# Patient Record
Sex: Female | Born: 1960
Health system: Southern US, Community
[De-identification: ages and names within clinical notes are randomized; demographics above are authoritative.]

## PROBLEM LIST (undated history)

## (undated) DIAGNOSIS — I10 Essential (primary) hypertension: Secondary | ICD-10-CM

## (undated) DIAGNOSIS — F419 Anxiety disorder, unspecified: Secondary | ICD-10-CM

## (undated) DIAGNOSIS — I2699 Other pulmonary embolism without acute cor pulmonale: Secondary | ICD-10-CM

## (undated) DIAGNOSIS — I82412 Acute embolism and thrombosis of left femoral vein: Secondary | ICD-10-CM

## (undated) DIAGNOSIS — I871 Compression of vein: Secondary | ICD-10-CM

## (undated) DIAGNOSIS — M1990 Unspecified osteoarthritis, unspecified site: Secondary | ICD-10-CM

## (undated) DIAGNOSIS — M797 Fibromyalgia: Secondary | ICD-10-CM

## (undated) DIAGNOSIS — K219 Gastro-esophageal reflux disease without esophagitis: Secondary | ICD-10-CM

## (undated) DIAGNOSIS — J45909 Unspecified asthma, uncomplicated: Secondary | ICD-10-CM

## (undated) DIAGNOSIS — Z8669 Personal history of other diseases of the nervous system and sense organs: Secondary | ICD-10-CM

## (undated) HISTORY — DX: Anxiety disorder, unspecified: F41.9

## (undated) HISTORY — PX: IVC FILTER INSERTION: CATH118245

## (undated) HISTORY — DX: Compression of vein: I87.1

## (undated) HISTORY — PX: JOINT REPLACEMENT: SHX530

## (undated) HISTORY — PX: CHOLECYSTECTOMY: SHX55

## (undated) HISTORY — PX: ABDOMINAL HYSTERECTOMY: SHX81

---

## 1999-11-07 ENCOUNTER — Encounter: Payer: Self-pay | Admitting: Orthopedic Surgery

## 1999-11-07 ENCOUNTER — Encounter: Admission: RE | Admit: 1999-11-07 | Discharge: 1999-11-07 | Payer: Self-pay | Admitting: Orthopedic Surgery

## 2005-01-05 ENCOUNTER — Ambulatory Visit: Payer: Self-pay | Admitting: Cardiology

## 2007-11-13 ENCOUNTER — Inpatient Hospital Stay (HOSPITAL_COMMUNITY): Admission: RE | Admit: 2007-11-13 | Discharge: 2007-11-17 | Payer: Self-pay | Admitting: Orthopedic Surgery

## 2007-11-21 ENCOUNTER — Ambulatory Visit (HOSPITAL_COMMUNITY): Admission: RE | Admit: 2007-11-21 | Discharge: 2007-11-21 | Payer: Self-pay | Admitting: Orthopedic Surgery

## 2007-11-21 ENCOUNTER — Ambulatory Visit: Payer: Self-pay | Admitting: Surgery

## 2007-11-21 ENCOUNTER — Encounter (INDEPENDENT_AMBULATORY_CARE_PROVIDER_SITE_OTHER): Payer: Self-pay | Admitting: Orthopedic Surgery

## 2007-12-10 ENCOUNTER — Encounter: Admission: RE | Admit: 2007-12-10 | Discharge: 2008-03-09 | Payer: Self-pay | Admitting: Orthopedic Surgery

## 2010-05-16 ENCOUNTER — Other Ambulatory Visit: Payer: Self-pay | Admitting: Obstetrics and Gynecology

## 2010-05-16 DIAGNOSIS — Z1231 Encounter for screening mammogram for malignant neoplasm of breast: Secondary | ICD-10-CM

## 2010-05-25 ENCOUNTER — Ambulatory Visit: Payer: Self-pay

## 2010-07-26 NOTE — H&P (Signed)
Theresa Lucas, Theresa Lucas               ACCOUNT NO.:  [id number]   MEDICAL RECORD NO.:  [id number]          PATIENT TYPE:  INP   LOCATION:                               FACILITY[place]  [facility] [place]   PHYSICIAN:  Georges Lynch. Gioffre, M.D.DATE OF BIRTH:  December 12, 1960   DATE OF ADMISSION:  11/13/2007  DATE OF DISCHARGE:                              HISTORY & PHYSICAL   CHIEF COMPLAINT:  Painful range of motion bilateral knees.   HISTORY OF PRESENT ILLNESS:  Theresa Lucas is a 50 year old female who has  been evaluated by Dr. Darrelyn Hillock for bilateral knee pain.  X-rays reveal  that she has got near bone-on-bone medial compartment bilateral knees.  The patient has been having a very poor quality of life due to pain and  difficulty ambulating.  She is unable to walk around and participate  with activities with her daughter.  She has failed arthroscopies,  viscosupplements and cortisone injections.  She is on chronic pain  medicines and she would like to proceed with knee replacements.  Currently, the right knee is significantly worse than left knee.   PAST MEDICAL HISTORY:  1. Some anxiety and depression.  2. Peripheral edema.  3. Reflux disease.  4. Hemorrhoids.  5. History of gallstones with subsequent cholecystectomy.  6. History of kidney stones.   CURRENT MEDICATIONS:  1. Prozac 40 mg a day.  2. Lasix 20 mg b.i.d.  3. Darvocet 1 tablet every 4-6 hours.  4. __________20 mg once a day.  5. Trazodone 100 mg nightly.   ALLERGIES:  NO KNOWN DRUG ALLERGIES.   PRIMARY CARE PHYSICIAN:  Dr. Almond Lint up in [place].   REVIEW OF SYSTEMS:  NEUROLOGIC:  Negative for any neurologic other than  the anxiety and depression which is well-controlled and being managed by  Dr. Garner Nash.  PULMONARY:  She denies any pulmonary.  CARDIOVASCULAR:  Just has some peripheral edema for which she is on Lasix.  She had a  stress test 2 years previous which was within normal limits, was found  to be reflux.  GI:  She does have  reflux.  This is well controlled with  the medications.  She does have some hemorrhoids.  GU:  She does have a  history of kidney stones last 4 years previous.  No frequent UTIs or any  other problems.  ENDOCRINE:  Unremarkable other than gestational  diabetes with each of her children and these cleared postdelivery.  HEMATOLOGIC:  Unremarkable.   PAST SURGICAL HISTORY:  Knee arthroscopies and cholecystectomy without  any complications with anesthesia.   FAMILY MEDICAL HISTORY:  Father is deceased from a stroke at 82.  Mother  is deceased from colon cancer at the age of 20.  One sister with heart  disease at the age of 26.   SOCIAL HISTORY:  The patient is married.  She works for [employer].  She has never smoked.  No alcohol or drug problems.  She has  three grown children and lives with her family and will provide care  after her discharge.   PHYSICAL EXAMINATION:  VITAL SIGNS:  Height is 5 foot 4 inches, weight  is 240.  Blood pressure is 148/88, pulse of 70 and regular, respirations  are 12 and nonlabored.  The patient is afebrile.  GENERAL:  This is a heavy-set female, conscious, alert and appropriate,  appears to be a good historian.  Walks with a very difficult and awkward  gait due to difficulty with motion of her knees.  HEENT:  Head was normocephalic.  Pupils equal, round and reactive.  Gross hearing is intact.  NECK:  Supple.  No palpable lymphadenopathy.  CHEST:  Lung sounds were clear and equal bilaterally.  HEART:  Regular rate and rhythm.  No murmurs, rubs or gallops.  ABDOMEN:  Soft, nontender.  Bowel sounds present.  EXTREMITIES:  The patient had excellent range of motion of shoulders,  elbows and wrists.  Motor strength 5/5.  LOWER EXTREMITIES:  Both hips had full extension, flexion up to 120  degrees with 20 degrees internal-external rotation without any  difficulty.  Bilateral knees were boggy appearing.  No signs of erythema  or ecchymosis.  Right  knee; she lacked about 5 degrees of extension.  She was able to flex it back to 90 degrees.  Left knee; she was able to  fully extend it, but she could only go back to 90 degrees.  She had no  gross instability.  No effusions.  Both calves were soft and nontender.  Ankles were symmetric with good motion.  PERIPHERAL VASCULAR:  Carotid pulses were 2+, no bruits.  Radial pulses  were 2+.  Dorsalis pedis pulses were 2+.  She had a few scattered  varicosities, but she had no lower extremity edema or pigmentation  changes.  NEURO:  The patient was conscious, alert and appropriate, appeared to be  a good historian.  No gross neurologic deficits.  BREAST/RECTAL/GU:  Deferred at this time.   IMPRESSION:  1. End-stage osteoarthritis bilateral knees, right more symptomatic      than left.  2. Obesity.  3. Anxiety/depression.  4. Gastroesophageal reflux disease.  5. Hemorrhoids.  6. History of peripheral edema.   PLAN:  The patient will undergo all routine labs and tests prior to  having a right total knee arthroplasty by Dr. Darrelyn Hillock at [facility] on November 13, 2007.  The patient has been cleared by her  primary care physician, Dr. Almond Lint up in [place].      Jamelle Rushing, P.A.    ______________________________  Georges Lynch Darrelyn Hillock, M.D.    RWK/MEDQ  D:  11/05/2007  T:  11/05/2007  Job:  [id number]   cc:   Windy Fast A. Darrelyn Hillock, M.D.  Fax: [phone]

## 2010-07-26 NOTE — Discharge Summary (Signed)
Theresa Lucas, Theresa Lucas               ACCOUNT NO.:  [id number]   MEDICAL RECORD NO.:  [id number]          PATIENT TYPE:  INP   LOCATION:  1612                         FACILITY:  [facility]   PHYSICIAN:  Georges Lynch. Gioffre, M.D.DATE OF BIRTH:  1961/01/11   DATE OF ADMISSION:  11/13/2007  DATE OF DISCHARGE:  11/17/2007                               DISCHARGE SUMMARY   DISPOSITION:  To home.   ADMISSION DIAGNOSES:  1. End-stage osteoarthritis bilateral knees, right more symptomatic      left.  2. Obesity.  3. Anxiety/depression.  4. Gastroesophageal reflux disease.  5. Hemorrhoids.  6. History of peripheral edema.   DISCHARGE DIAGNOSES:  1. Status post right total knee arthroplasty.  2. Left knee end-stage osteoarthritis.  3. Obesity.  4. Anxiety/depression.  5. Gastroesophageal reflux disease.  6. History of hemorrhoids.  7. History of peripheral edema.   HISTORY OF PRESENT ILLNESS:  The patient is a 50 year old female patient  of Dr. Jeannetta Ellis who has been evaluated and treated for bilateral knee  pain.  X-rays reveal she was end-stage osteoarthritis, bone on bone,  medial compartment bilateral knees.  The patient states she is having a  poor quality of life due to the pain and difficulty with ambulations.  She has failed conservative treatment.  The patient has elected proceed  with a total knee arthroplasty on her right knee due to that is more  symptomatic than right.   ALLERGIES:  NO KNOWN DRUG ALLERGIES.   CURRENT MEDICATIONS:  On admission:  1. Prozac 40 mg a day.  2. Lasix 20 mg twice a day.  3. Darvocet 1 tablet every 4-6 hours.  4. Aciphex 20 mg a day.  5. Trazodone 100 mg at night.   SURGICAL PROCEDURES:  On November 13, 2007 the patient was taken to the  OR by Dr. Ranee Gosselin, assisted by Oneida Alar, PA-C. Under general  anesthesia the patient underwent a right total knee arthroplasty with a  DePuy rotating platform system.  The patient had no complications.  Minimal blood loss.  The patient tolerated the procedure well.  The  patient had the following components implanted:  A size 2.5 femoral  component, a size 3 keel tibial tray, a size 2.5 polyethylene bearing  12.5 mm thickness, a size 35 three peg patella.  All components were  implanted with polymethyl methacrylate and vancomycin.  The patient was  transferred to recovery room and then to the orthopedic floor in good  condition for total knee protocol.   CONSULTS:  The following routine consults requested: Physical therapy,  case management, pharmacy.   HOSPITAL COURSE:  On November 13, 2007 the patient was admitted to  [facility] under the care of Dr. Ranee Gosselin. The patient  was taken to the OR where a right total knee arthroplasty was performed.  The patient tolerated the procedure well.  There were no complications.  The patient was transferred recovery room and then to the orthopedic  floor in good condition on IV pain medicines, antibiotics, Coumadin and  heparin for DVT prophylaxis to follow with total knee protocol.  The  patient incurred a total of 4 days postoperative period.  The patient  had no significant untoward events occur.  The patient was able to wean  off IV medications and tolerated p.o. meds well.  The patient's vital  signs remained stable.  The patient remained afebrile.  The patient's  wound remained benign for any signs of infection.  Her leg remained  neuromotor vascularly intact.  The patient had minimal blood loss.  The  patient's INR did increase with the Coumadin.  The patient worked well  with physical therapy.  The patient was ambulating approximately 250  feet with the use of a walker with therapy.  The patient had  arrangements made for outpatient home health physical therapy.  The  patient's was given discharge instructions and discharged home in good  condition.   LABS:  CBC on admission found WBCs 10.1, hemoglobin 14.6, hematocrit   43.9, platelets 364.  On discharge her hemoglobin had dropped to 11.1  with 32.5 hematocrit.  The patient's routine chemistries on admission  were within normal limits with an estimated GFR of greater than 60.  The  patient's INR on date of discharge was 1.4.  The patient did have some  slightly cloudy urine on preop with small hemoglobin, few epithelial  cells, few bacteria present felt to be contaminant but was treated with  routine postoperative and antibiotics for 24 hours.  She did have 45,000  colonies of E-coli.   X-rays postop showed right total knee arthroplasty without complicating  features.   DISCHARGE INSTRUCTIONS:  1. Diet, no restrictions.  2. The patient is to slowly increase activity as tolerated with use of      walker.  She may shower and bathe.  3. Wound care. The patient is to change her dressing on a daily basis.  4. The patient needs a follow up appointment with Dr. Darrelyn Hillock in 2      weeks from date of surgery. The patient is to call [phone] for      that follow up appointment.  5. Home health physical therapy through [place] and also to monitor      Coumadin per protocol.   MEDICATIONS:  1. Percocet 10/640 one every 4-6 hours for pain if needed.  2. Robaxin 500 mg 1 tablet every 6 hours for spasms if needed.  3. Coumadin 5 mg a day unless changed by Genevieve Norlander pharmacist.  4. Trazodone 50 mg 2 tablets at night as needed.  5. Darvocet to be on hold while taking Percocet.  6. Fluoxetine 40 mg once a day.  7. Lasix 20 mg 1 tablet twice a day.  8. Aciphex 20 mg once a day.   The patient's condition upon discharge to home is listed improved and  good.      Jamelle Rushing, P.A.    ______________________________  Georges Lynch Darrelyn Hillock, M.D.    RWK/MEDQ  D:  12/17/2007  T:  12/17/2007  Job:  [id number]

## 2010-07-26 NOTE — Op Note (Signed)
Theresa Lucas, Theresa Lucas               ACCOUNT NO.:  [id number]   MEDICAL RECORD NO.:  [id number]          PATIENT TYPE:  INP   LOCATION:  0003                         FACILITY:  [facility]   PHYSICIAN:  Georges Lynch. Gioffre, M.D.DATE OF BIRTH:  1960/07/08   DATE OF PROCEDURE:  11/13/2007  DATE OF DISCHARGE:                               OPERATIVE REPORT   PREOPERATIVE DIAGNOSIS:  Severe degenerative arthritis with collapsed  medial joint space, genu varus of right knee.   POSTOPERATIVE DIAGNOSIS:  Severe degenerative arthritis with collapsed  medial joint space, genu varus of right knee.   SURGEON:  Georges Lynch. Darrelyn Hillock, M.D.   ASSISTANT:  Jamelle Rushing, P.A.   OPERATION:  Right total knee arthroplasty utilizing the DePuy system.  I  utilized a size 2.5 right femoral posterior stabilized component.  I  utilized the tibial insert measuring 12.5 mm in thickness, 2.5 mm in  diameter that was a rotating platform type.  The tibial tray was a size  3.  The patella was a size 35 mm with three pegs.  All three components  were cemented and vancomycin was used in the cement.   PROCEDURE IN DETAIL:  Under general anesthesia routine orthopedic prep  and drape of the right lower extremity was carried out.  Note, prior to  taking him back under general anesthesia the anesthesiologist, Dr.  Rica Mast, did a right femoral nerve block.  While in surgery a routine  orthopedic prep and drape of the right knee was carried out.  She had 2  grams IV Ancef preop.  At this time the knee was flexed.  The leg was  exsanguinated first with an Esmarch.  The tourniquet was elevated at 375  mmHg.  At this time an incision was made over the anterior aspect of the  right knee.  Bleeders identified and cauterized.  Two flaps were  created.  Self-retaining retractors were inserted.  The two flaps were  created.  I carried out a median parapatellar incision reflecting the  patella laterally and then flexed the knee and did  medial and lateral  meniscectomies and excised the anterior and posterior cruciate  ligaments.  Following that, initial drill holes made in the  intercondylar notch.  The intramedullary guide rod was inserted and  removed 12 mm thickness off the distal femur.  Following that we made  our appropriate anterior and posterior, we took our measuring device and  measured the femur to be a size 2.5 right and we inserted our third jig  and did our anterior and posterior and chamfer cuts for a size 2.5 right  femur.  Following that we prepared the tibia by utilizing intramedullary  guide rod and we removed the approximately 4 mm thickness off the  affected medial side.  We had a nice tibial plateau cut from lateral to  medial.  There were no other abnormalities noted in the knee accept some  small spurs which we removed with a rongeurs.  We then prepared the  tibia by going ahead and cutting our keel cut in the usual fashion.  Then we  went back and cut our notch cut out of the femur.  We thoroughly  water picked out the knee.  We then utilized our spacer blocks in  flexion/extension and had good stability.  Following that we then  inserted our trial components and finally selected a 12.5 mm thickness  rotating platform.  We first tried a 10 mm thickness and we then went to  the 12.5 and had good stability and good motion.  Following that I then  did a resurfacing procedure on the patella for a 35 mm patella.  We did  that in a routine fashion.  Three drill holes then were made in the  patella.  The appropriate measurements were taken prior to doing the cut  and post cutting the resurfacing of the patella.  We then removed all  the trials, thoroughly water picked out the knee and cemented all three  components in simultaneously.  We then searched for loose pieces of  cement, removed any loose pieces of cement.  We went back and trialed  the knee again.  We utilized a 12.5 mm thickness rotating  platform that  fit anatomical.  We inserted our permanent 12.5 mm rotating platform  tibial component and reduced the knee and had  good motion and good stability.  We thoroughly water picked the knee out  again and inserted a Hemovac drain and then irrigated the knee out again  with antibiotic solution.  I then closed the wound in layers in the  usual fashion over a Hemovac drain.  Sterile dressings were applied.  The patient left the operating room in satisfactory condition.           ______________________________  Georges Lynch Darrelyn Hillock, M.D.     RAG/MEDQ  D:  11/13/2007  T:  11/13/2007  Job:  [id number]   cc:   Donzetta Sprung  Fax: [phone]

## 2010-12-14 LAB — PROTIME-INR
INR: 1.1
INR: 1.4
INR: 1.5
Prothrombin Time: 14
Prothrombin Time: 17.2 — ABNORMAL HIGH

## 2010-12-14 LAB — HEMOGLOBIN AND HEMATOCRIT, BLOOD
HCT: 32.5 — ABNORMAL LOW
HCT: 35.2 — ABNORMAL LOW
HCT: 36.1
Hemoglobin: 11.1 — ABNORMAL LOW
Hemoglobin: 13.9

## 2010-12-14 LAB — ABO/RH: ABO/RH(D): O POS

## 2010-12-14 LAB — TYPE AND SCREEN
ABO/RH(D): O POS
Antibody Screen: NEGATIVE

## 2013-03-13 DIAGNOSIS — I2699 Other pulmonary embolism without acute cor pulmonale: Secondary | ICD-10-CM

## 2013-03-13 HISTORY — DX: Other pulmonary embolism without acute cor pulmonale: I26.99

## 2013-08-01 DIAGNOSIS — M1710 Unilateral primary osteoarthritis, unspecified knee: Secondary | ICD-10-CM | POA: Insufficient documentation

## 2013-08-01 DIAGNOSIS — M1712 Unilateral primary osteoarthritis, left knee: Secondary | ICD-10-CM | POA: Insufficient documentation

## 2013-08-01 DIAGNOSIS — M179 Osteoarthritis of knee, unspecified: Secondary | ICD-10-CM | POA: Insufficient documentation

## 2013-08-20 ENCOUNTER — Ambulatory Visit: Payer: Medicare PPO | Attending: Orthopedic Surgery | Admitting: Physical Therapy

## 2013-08-20 DIAGNOSIS — I10 Essential (primary) hypertension: Secondary | ICD-10-CM | POA: Diagnosis not present

## 2013-08-20 DIAGNOSIS — Z96659 Presence of unspecified artificial knee joint: Secondary | ICD-10-CM | POA: Diagnosis not present

## 2013-08-20 DIAGNOSIS — M25669 Stiffness of unspecified knee, not elsewhere classified: Secondary | ICD-10-CM | POA: Diagnosis not present

## 2013-08-20 DIAGNOSIS — M25569 Pain in unspecified knee: Secondary | ICD-10-CM | POA: Insufficient documentation

## 2013-08-20 DIAGNOSIS — IMO0001 Reserved for inherently not codable concepts without codable children: Secondary | ICD-10-CM | POA: Diagnosis not present

## 2013-08-21 ENCOUNTER — Ambulatory Visit: Payer: Medicare PPO | Admitting: *Deleted

## 2013-08-21 DIAGNOSIS — IMO0001 Reserved for inherently not codable concepts without codable children: Secondary | ICD-10-CM | POA: Diagnosis not present

## 2013-08-24 DIAGNOSIS — I82422 Acute embolism and thrombosis of left iliac vein: Secondary | ICD-10-CM | POA: Insufficient documentation

## 2013-08-24 DIAGNOSIS — I2699 Other pulmonary embolism without acute cor pulmonale: Secondary | ICD-10-CM | POA: Insufficient documentation

## 2013-08-24 DIAGNOSIS — Z96659 Presence of unspecified artificial knee joint: Secondary | ICD-10-CM | POA: Insufficient documentation

## 2013-08-25 ENCOUNTER — Encounter: Payer: Commercial Managed Care - HMO | Admitting: Physical Therapy

## 2013-08-26 ENCOUNTER — Encounter: Payer: Commercial Managed Care - HMO | Admitting: Physical Therapy

## 2013-08-28 ENCOUNTER — Encounter: Payer: Commercial Managed Care - HMO | Admitting: Physical Therapy

## 2013-08-30 DIAGNOSIS — I871 Compression of vein: Secondary | ICD-10-CM | POA: Insufficient documentation

## 2013-09-02 DIAGNOSIS — K5900 Constipation, unspecified: Secondary | ICD-10-CM | POA: Insufficient documentation

## 2013-09-11 ENCOUNTER — Ambulatory Visit: Payer: Medicare PPO | Attending: Orthopedic Surgery | Admitting: Physical Therapy

## 2013-09-11 DIAGNOSIS — M25669 Stiffness of unspecified knee, not elsewhere classified: Secondary | ICD-10-CM | POA: Insufficient documentation

## 2013-09-11 DIAGNOSIS — M25569 Pain in unspecified knee: Secondary | ICD-10-CM | POA: Diagnosis not present

## 2013-09-11 DIAGNOSIS — Z96659 Presence of unspecified artificial knee joint: Secondary | ICD-10-CM | POA: Diagnosis not present

## 2013-09-11 DIAGNOSIS — IMO0001 Reserved for inherently not codable concepts without codable children: Secondary | ICD-10-CM | POA: Insufficient documentation

## 2013-09-11 DIAGNOSIS — I10 Essential (primary) hypertension: Secondary | ICD-10-CM | POA: Diagnosis not present

## 2013-09-15 ENCOUNTER — Ambulatory Visit: Payer: Medicare PPO | Admitting: *Deleted

## 2013-09-15 DIAGNOSIS — IMO0001 Reserved for inherently not codable concepts without codable children: Secondary | ICD-10-CM | POA: Diagnosis not present

## 2013-09-16 ENCOUNTER — Ambulatory Visit: Payer: Medicare PPO | Admitting: Physical Therapy

## 2013-09-16 DIAGNOSIS — IMO0001 Reserved for inherently not codable concepts without codable children: Secondary | ICD-10-CM | POA: Diagnosis not present

## 2013-09-18 ENCOUNTER — Ambulatory Visit: Payer: Medicare PPO | Admitting: Physical Therapy

## 2013-09-18 DIAGNOSIS — IMO0001 Reserved for inherently not codable concepts without codable children: Secondary | ICD-10-CM | POA: Diagnosis not present

## 2013-09-22 ENCOUNTER — Ambulatory Visit: Payer: Medicare PPO | Admitting: Physical Therapy

## 2013-09-22 DIAGNOSIS — IMO0001 Reserved for inherently not codable concepts without codable children: Secondary | ICD-10-CM | POA: Diagnosis not present

## 2013-09-23 ENCOUNTER — Ambulatory Visit: Payer: Medicare PPO | Admitting: *Deleted

## 2013-09-23 DIAGNOSIS — IMO0001 Reserved for inherently not codable concepts without codable children: Secondary | ICD-10-CM | POA: Diagnosis not present

## 2013-09-25 ENCOUNTER — Ambulatory Visit: Payer: Medicare PPO | Admitting: Physical Therapy

## 2013-09-25 DIAGNOSIS — IMO0001 Reserved for inherently not codable concepts without codable children: Secondary | ICD-10-CM | POA: Diagnosis not present

## 2013-09-30 ENCOUNTER — Encounter: Payer: Commercial Managed Care - HMO | Admitting: Physical Therapy

## 2013-10-01 ENCOUNTER — Ambulatory Visit: Payer: Medicare PPO | Admitting: Physical Therapy

## 2013-10-01 DIAGNOSIS — IMO0001 Reserved for inherently not codable concepts without codable children: Secondary | ICD-10-CM | POA: Diagnosis not present

## 2013-10-02 ENCOUNTER — Ambulatory Visit: Payer: Medicare PPO | Admitting: Physical Therapy

## 2013-10-02 DIAGNOSIS — IMO0001 Reserved for inherently not codable concepts without codable children: Secondary | ICD-10-CM | POA: Diagnosis not present

## 2013-10-06 ENCOUNTER — Ambulatory Visit: Payer: Medicare PPO | Admitting: Physical Therapy

## 2013-10-06 DIAGNOSIS — IMO0001 Reserved for inherently not codable concepts without codable children: Secondary | ICD-10-CM | POA: Diagnosis not present

## 2013-10-07 ENCOUNTER — Ambulatory Visit: Payer: Medicare PPO | Admitting: Physical Therapy

## 2013-10-07 DIAGNOSIS — IMO0001 Reserved for inherently not codable concepts without codable children: Secondary | ICD-10-CM | POA: Diagnosis not present

## 2013-10-09 ENCOUNTER — Ambulatory Visit: Payer: Medicare PPO | Admitting: Physical Therapy

## 2013-10-13 ENCOUNTER — Ambulatory Visit: Payer: Medicare PPO | Attending: Orthopedic Surgery | Admitting: Physical Therapy

## 2013-10-13 DIAGNOSIS — Z96659 Presence of unspecified artificial knee joint: Secondary | ICD-10-CM | POA: Diagnosis not present

## 2013-10-13 DIAGNOSIS — IMO0001 Reserved for inherently not codable concepts without codable children: Secondary | ICD-10-CM | POA: Insufficient documentation

## 2013-10-13 DIAGNOSIS — M25669 Stiffness of unspecified knee, not elsewhere classified: Secondary | ICD-10-CM | POA: Diagnosis not present

## 2013-10-13 DIAGNOSIS — I10 Essential (primary) hypertension: Secondary | ICD-10-CM | POA: Insufficient documentation

## 2013-10-13 DIAGNOSIS — M25569 Pain in unspecified knee: Secondary | ICD-10-CM | POA: Diagnosis not present

## 2013-10-14 ENCOUNTER — Encounter: Payer: Commercial Managed Care - HMO | Admitting: Physical Therapy

## 2013-10-17 ENCOUNTER — Encounter: Payer: Commercial Managed Care - HMO | Admitting: *Deleted

## 2013-10-20 ENCOUNTER — Ambulatory Visit: Payer: Medicare PPO | Admitting: Physical Therapy

## 2013-10-20 DIAGNOSIS — IMO0001 Reserved for inherently not codable concepts without codable children: Secondary | ICD-10-CM | POA: Diagnosis not present

## 2013-10-21 ENCOUNTER — Ambulatory Visit: Payer: Medicare PPO | Admitting: *Deleted

## 2013-10-21 DIAGNOSIS — IMO0001 Reserved for inherently not codable concepts without codable children: Secondary | ICD-10-CM | POA: Diagnosis not present

## 2013-10-23 ENCOUNTER — Ambulatory Visit: Payer: Medicare PPO | Admitting: Physical Therapy

## 2013-10-23 DIAGNOSIS — IMO0001 Reserved for inherently not codable concepts without codable children: Secondary | ICD-10-CM | POA: Diagnosis not present

## 2013-10-27 ENCOUNTER — Ambulatory Visit: Payer: Medicare PPO | Admitting: Physical Therapy

## 2013-10-27 DIAGNOSIS — IMO0001 Reserved for inherently not codable concepts without codable children: Secondary | ICD-10-CM | POA: Diagnosis not present

## 2013-10-29 ENCOUNTER — Ambulatory Visit: Payer: Medicare PPO | Admitting: Physical Therapy

## 2013-10-29 DIAGNOSIS — IMO0001 Reserved for inherently not codable concepts without codable children: Secondary | ICD-10-CM | POA: Diagnosis not present

## 2013-10-31 ENCOUNTER — Ambulatory Visit: Payer: Medicare PPO | Admitting: Physical Therapy

## 2013-10-31 DIAGNOSIS — IMO0001 Reserved for inherently not codable concepts without codable children: Secondary | ICD-10-CM | POA: Diagnosis not present

## 2013-11-03 ENCOUNTER — Ambulatory Visit: Payer: Medicare PPO | Admitting: Physical Therapy

## 2013-11-04 ENCOUNTER — Ambulatory Visit: Payer: Medicare PPO | Admitting: Physical Therapy

## 2013-11-04 DIAGNOSIS — IMO0001 Reserved for inherently not codable concepts without codable children: Secondary | ICD-10-CM | POA: Diagnosis not present

## 2013-11-06 ENCOUNTER — Ambulatory Visit: Payer: Medicare PPO | Admitting: Physical Therapy

## 2013-11-06 DIAGNOSIS — IMO0001 Reserved for inherently not codable concepts without codable children: Secondary | ICD-10-CM | POA: Diagnosis not present

## 2013-11-10 ENCOUNTER — Encounter: Payer: Commercial Managed Care - HMO | Admitting: Physical Therapy

## 2013-11-11 ENCOUNTER — Ambulatory Visit: Payer: Medicare PPO | Attending: Orthopedic Surgery | Admitting: Physical Therapy

## 2013-11-11 DIAGNOSIS — I10 Essential (primary) hypertension: Secondary | ICD-10-CM | POA: Insufficient documentation

## 2013-11-11 DIAGNOSIS — Z96659 Presence of unspecified artificial knee joint: Secondary | ICD-10-CM | POA: Insufficient documentation

## 2013-11-11 DIAGNOSIS — IMO0001 Reserved for inherently not codable concepts without codable children: Secondary | ICD-10-CM | POA: Diagnosis present

## 2013-11-11 DIAGNOSIS — M25669 Stiffness of unspecified knee, not elsewhere classified: Secondary | ICD-10-CM | POA: Insufficient documentation

## 2013-11-11 DIAGNOSIS — M25569 Pain in unspecified knee: Secondary | ICD-10-CM | POA: Diagnosis not present

## 2013-11-13 ENCOUNTER — Ambulatory Visit: Payer: Medicare PPO | Admitting: Physical Therapy

## 2013-11-13 DIAGNOSIS — IMO0001 Reserved for inherently not codable concepts without codable children: Secondary | ICD-10-CM | POA: Diagnosis not present

## 2013-11-18 ENCOUNTER — Ambulatory Visit: Payer: Medicare PPO | Admitting: Physical Therapy

## 2013-11-18 DIAGNOSIS — IMO0001 Reserved for inherently not codable concepts without codable children: Secondary | ICD-10-CM | POA: Diagnosis not present

## 2013-11-19 ENCOUNTER — Ambulatory Visit: Payer: Medicare PPO | Admitting: Physical Therapy

## 2013-11-19 DIAGNOSIS — IMO0001 Reserved for inherently not codable concepts without codable children: Secondary | ICD-10-CM | POA: Diagnosis not present

## 2013-11-25 ENCOUNTER — Ambulatory Visit: Payer: Medicare PPO | Admitting: Physical Therapy

## 2013-11-25 DIAGNOSIS — IMO0001 Reserved for inherently not codable concepts without codable children: Secondary | ICD-10-CM | POA: Diagnosis not present

## 2013-11-26 ENCOUNTER — Ambulatory Visit: Payer: Medicare PPO | Admitting: Physical Therapy

## 2013-11-26 DIAGNOSIS — IMO0001 Reserved for inherently not codable concepts without codable children: Secondary | ICD-10-CM | POA: Diagnosis not present

## 2013-11-27 ENCOUNTER — Encounter: Payer: Commercial Managed Care - HMO | Admitting: Physical Therapy

## 2014-03-16 DIAGNOSIS — Z1231 Encounter for screening mammogram for malignant neoplasm of breast: Secondary | ICD-10-CM | POA: Diagnosis not present

## 2014-05-08 DIAGNOSIS — M1990 Unspecified osteoarthritis, unspecified site: Secondary | ICD-10-CM | POA: Diagnosis not present

## 2014-05-08 DIAGNOSIS — M545 Low back pain: Secondary | ICD-10-CM | POA: Diagnosis not present

## 2014-05-08 DIAGNOSIS — M47812 Spondylosis without myelopathy or radiculopathy, cervical region: Secondary | ICD-10-CM | POA: Diagnosis not present

## 2014-05-08 DIAGNOSIS — M47816 Spondylosis without myelopathy or radiculopathy, lumbar region: Secondary | ICD-10-CM | POA: Diagnosis not present

## 2014-05-10 DIAGNOSIS — R300 Dysuria: Secondary | ICD-10-CM | POA: Diagnosis not present

## 2014-05-10 DIAGNOSIS — N390 Urinary tract infection, site not specified: Secondary | ICD-10-CM | POA: Diagnosis not present

## 2014-05-15 DIAGNOSIS — M5022 Other cervical disc displacement, mid-cervical region: Secondary | ICD-10-CM | POA: Diagnosis not present

## 2014-05-15 DIAGNOSIS — M47812 Spondylosis without myelopathy or radiculopathy, cervical region: Secondary | ICD-10-CM | POA: Diagnosis not present

## 2014-05-25 DIAGNOSIS — G8929 Other chronic pain: Secondary | ICD-10-CM | POA: Diagnosis not present

## 2014-05-25 DIAGNOSIS — M47812 Spondylosis without myelopathy or radiculopathy, cervical region: Secondary | ICD-10-CM | POA: Diagnosis not present

## 2014-05-25 DIAGNOSIS — M47816 Spondylosis without myelopathy or radiculopathy, lumbar region: Secondary | ICD-10-CM | POA: Diagnosis not present

## 2014-05-25 DIAGNOSIS — Z86711 Personal history of pulmonary embolism: Secondary | ICD-10-CM | POA: Diagnosis not present

## 2014-05-25 DIAGNOSIS — M419 Scoliosis, unspecified: Secondary | ICD-10-CM | POA: Diagnosis not present

## 2014-05-25 DIAGNOSIS — M797 Fibromyalgia: Secondary | ICD-10-CM | POA: Diagnosis not present

## 2014-05-25 DIAGNOSIS — Z96653 Presence of artificial knee joint, bilateral: Secondary | ICD-10-CM | POA: Diagnosis not present

## 2014-05-25 DIAGNOSIS — Z79899 Other long term (current) drug therapy: Secondary | ICD-10-CM | POA: Diagnosis not present

## 2014-05-25 DIAGNOSIS — M1990 Unspecified osteoarthritis, unspecified site: Secondary | ICD-10-CM | POA: Diagnosis not present

## 2014-06-10 DIAGNOSIS — Z86711 Personal history of pulmonary embolism: Secondary | ICD-10-CM | POA: Diagnosis not present

## 2014-06-10 DIAGNOSIS — J301 Allergic rhinitis due to pollen: Secondary | ICD-10-CM | POA: Diagnosis not present

## 2014-06-10 DIAGNOSIS — K219 Gastro-esophageal reflux disease without esophagitis: Secondary | ICD-10-CM | POA: Diagnosis not present

## 2014-06-10 DIAGNOSIS — Z9189 Other specified personal risk factors, not elsewhere classified: Secondary | ICD-10-CM | POA: Diagnosis not present

## 2014-06-10 DIAGNOSIS — E782 Mixed hyperlipidemia: Secondary | ICD-10-CM | POA: Diagnosis not present

## 2014-06-10 DIAGNOSIS — F324 Major depressive disorder, single episode, in partial remission: Secondary | ICD-10-CM | POA: Diagnosis not present

## 2014-06-10 DIAGNOSIS — E6609 Other obesity due to excess calories: Secondary | ICD-10-CM | POA: Diagnosis not present

## 2014-06-10 DIAGNOSIS — Z1389 Encounter for screening for other disorder: Secondary | ICD-10-CM | POA: Diagnosis not present

## 2014-06-10 DIAGNOSIS — I10 Essential (primary) hypertension: Secondary | ICD-10-CM | POA: Diagnosis not present

## 2014-06-10 DIAGNOSIS — M797 Fibromyalgia: Secondary | ICD-10-CM | POA: Diagnosis not present

## 2014-06-11 DIAGNOSIS — F329 Major depressive disorder, single episode, unspecified: Secondary | ICD-10-CM | POA: Diagnosis not present

## 2014-06-11 DIAGNOSIS — Z79899 Other long term (current) drug therapy: Secondary | ICD-10-CM | POA: Diagnosis not present

## 2014-06-11 DIAGNOSIS — G8929 Other chronic pain: Secondary | ICD-10-CM | POA: Diagnosis not present

## 2014-06-11 DIAGNOSIS — Z96653 Presence of artificial knee joint, bilateral: Secondary | ICD-10-CM | POA: Diagnosis not present

## 2014-06-11 DIAGNOSIS — M797 Fibromyalgia: Secondary | ICD-10-CM | POA: Diagnosis not present

## 2014-06-11 DIAGNOSIS — M47812 Spondylosis without myelopathy or radiculopathy, cervical region: Secondary | ICD-10-CM | POA: Diagnosis not present

## 2014-06-11 DIAGNOSIS — M1990 Unspecified osteoarthritis, unspecified site: Secondary | ICD-10-CM | POA: Diagnosis not present

## 2014-06-11 DIAGNOSIS — M47816 Spondylosis without myelopathy or radiculopathy, lumbar region: Secondary | ICD-10-CM | POA: Diagnosis not present

## 2014-06-11 DIAGNOSIS — Z7901 Long term (current) use of anticoagulants: Secondary | ICD-10-CM | POA: Diagnosis not present

## 2014-07-16 DIAGNOSIS — Z96653 Presence of artificial knee joint, bilateral: Secondary | ICD-10-CM | POA: Diagnosis not present

## 2014-07-16 DIAGNOSIS — M47816 Spondylosis without myelopathy or radiculopathy, lumbar region: Secondary | ICD-10-CM | POA: Diagnosis not present

## 2014-07-16 DIAGNOSIS — M47812 Spondylosis without myelopathy or radiculopathy, cervical region: Secondary | ICD-10-CM | POA: Diagnosis not present

## 2014-07-16 DIAGNOSIS — M1990 Unspecified osteoarthritis, unspecified site: Secondary | ICD-10-CM | POA: Diagnosis not present

## 2014-07-16 DIAGNOSIS — M797 Fibromyalgia: Secondary | ICD-10-CM | POA: Diagnosis not present

## 2014-07-16 DIAGNOSIS — Z79899 Other long term (current) drug therapy: Secondary | ICD-10-CM | POA: Diagnosis not present

## 2014-07-16 DIAGNOSIS — Z7901 Long term (current) use of anticoagulants: Secondary | ICD-10-CM | POA: Diagnosis not present

## 2014-07-16 DIAGNOSIS — Z86711 Personal history of pulmonary embolism: Secondary | ICD-10-CM | POA: Diagnosis not present

## 2014-07-16 DIAGNOSIS — G8929 Other chronic pain: Secondary | ICD-10-CM | POA: Diagnosis not present

## 2014-07-30 DIAGNOSIS — R202 Paresthesia of skin: Secondary | ICD-10-CM | POA: Diagnosis not present

## 2014-07-30 DIAGNOSIS — M79631 Pain in right forearm: Secondary | ICD-10-CM | POA: Diagnosis not present

## 2014-08-05 DIAGNOSIS — M5030 Other cervical disc degeneration, unspecified cervical region: Secondary | ICD-10-CM | POA: Diagnosis not present

## 2014-08-05 DIAGNOSIS — M47816 Spondylosis without myelopathy or radiculopathy, lumbar region: Secondary | ICD-10-CM | POA: Diagnosis not present

## 2014-08-05 DIAGNOSIS — M545 Low back pain: Secondary | ICD-10-CM | POA: Diagnosis not present

## 2014-08-05 DIAGNOSIS — M47812 Spondylosis without myelopathy or radiculopathy, cervical region: Secondary | ICD-10-CM | POA: Diagnosis not present

## 2014-08-11 DIAGNOSIS — M545 Low back pain: Secondary | ICD-10-CM | POA: Diagnosis not present

## 2014-08-11 DIAGNOSIS — M47812 Spondylosis without myelopathy or radiculopathy, cervical region: Secondary | ICD-10-CM | POA: Diagnosis not present

## 2014-09-08 DIAGNOSIS — G8929 Other chronic pain: Secondary | ICD-10-CM | POA: Diagnosis not present

## 2014-09-08 DIAGNOSIS — M797 Fibromyalgia: Secondary | ICD-10-CM | POA: Diagnosis not present

## 2014-09-08 DIAGNOSIS — M47816 Spondylosis without myelopathy or radiculopathy, lumbar region: Secondary | ICD-10-CM | POA: Diagnosis not present

## 2014-09-08 DIAGNOSIS — Z7901 Long term (current) use of anticoagulants: Secondary | ICD-10-CM | POA: Diagnosis not present

## 2014-09-08 DIAGNOSIS — Z96653 Presence of artificial knee joint, bilateral: Secondary | ICD-10-CM | POA: Diagnosis not present

## 2014-09-08 DIAGNOSIS — M1990 Unspecified osteoarthritis, unspecified site: Secondary | ICD-10-CM | POA: Diagnosis not present

## 2014-09-08 DIAGNOSIS — M5030 Other cervical disc degeneration, unspecified cervical region: Secondary | ICD-10-CM | POA: Diagnosis not present

## 2014-09-08 DIAGNOSIS — M47812 Spondylosis without myelopathy or radiculopathy, cervical region: Secondary | ICD-10-CM | POA: Diagnosis not present

## 2014-09-08 DIAGNOSIS — Z79899 Other long term (current) drug therapy: Secondary | ICD-10-CM | POA: Diagnosis not present

## 2014-09-09 DIAGNOSIS — K210 Gastro-esophageal reflux disease with esophagitis: Secondary | ICD-10-CM | POA: Diagnosis not present

## 2014-09-09 DIAGNOSIS — I10 Essential (primary) hypertension: Secondary | ICD-10-CM | POA: Diagnosis not present

## 2014-09-09 DIAGNOSIS — E782 Mixed hyperlipidemia: Secondary | ICD-10-CM | POA: Diagnosis not present

## 2014-09-16 DIAGNOSIS — J301 Allergic rhinitis due to pollen: Secondary | ICD-10-CM | POA: Diagnosis not present

## 2014-09-16 DIAGNOSIS — I10 Essential (primary) hypertension: Secondary | ICD-10-CM | POA: Diagnosis not present

## 2014-09-16 DIAGNOSIS — E6609 Other obesity due to excess calories: Secondary | ICD-10-CM | POA: Diagnosis not present

## 2014-09-16 DIAGNOSIS — F324 Major depressive disorder, single episode, in partial remission: Secondary | ICD-10-CM | POA: Diagnosis not present

## 2014-09-16 DIAGNOSIS — M797 Fibromyalgia: Secondary | ICD-10-CM | POA: Diagnosis not present

## 2014-09-16 DIAGNOSIS — Z86711 Personal history of pulmonary embolism: Secondary | ICD-10-CM | POA: Diagnosis not present

## 2014-09-16 DIAGNOSIS — K219 Gastro-esophageal reflux disease without esophagitis: Secondary | ICD-10-CM | POA: Diagnosis not present

## 2014-10-07 DIAGNOSIS — M47816 Spondylosis without myelopathy or radiculopathy, lumbar region: Secondary | ICD-10-CM | POA: Diagnosis not present

## 2014-10-07 DIAGNOSIS — M545 Low back pain: Secondary | ICD-10-CM | POA: Diagnosis not present

## 2014-10-07 DIAGNOSIS — M797 Fibromyalgia: Secondary | ICD-10-CM | POA: Diagnosis not present

## 2014-10-07 DIAGNOSIS — M47812 Spondylosis without myelopathy or radiculopathy, cervical region: Secondary | ICD-10-CM | POA: Diagnosis not present

## 2014-11-03 DIAGNOSIS — Z6838 Body mass index (BMI) 38.0-38.9, adult: Secondary | ICD-10-CM | POA: Diagnosis not present

## 2014-11-03 DIAGNOSIS — Z1389 Encounter for screening for other disorder: Secondary | ICD-10-CM | POA: Diagnosis not present

## 2014-11-03 DIAGNOSIS — Z01411 Encounter for gynecological examination (general) (routine) with abnormal findings: Secondary | ICD-10-CM | POA: Diagnosis not present

## 2014-11-03 DIAGNOSIS — Z130 Encounter for screening for diseases of the blood and blood-forming organs and certain disorders involving the immune mechanism: Secondary | ICD-10-CM | POA: Diagnosis not present

## 2014-11-03 DIAGNOSIS — N952 Postmenopausal atrophic vaginitis: Secondary | ICD-10-CM | POA: Diagnosis not present

## 2014-11-06 DIAGNOSIS — Z86711 Personal history of pulmonary embolism: Secondary | ICD-10-CM | POA: Diagnosis not present

## 2014-11-06 DIAGNOSIS — E6609 Other obesity due to excess calories: Secondary | ICD-10-CM | POA: Diagnosis not present

## 2014-11-06 DIAGNOSIS — K210 Gastro-esophageal reflux disease with esophagitis: Secondary | ICD-10-CM | POA: Diagnosis not present

## 2014-11-06 DIAGNOSIS — I10 Essential (primary) hypertension: Secondary | ICD-10-CM | POA: Diagnosis not present

## 2014-11-06 DIAGNOSIS — E782 Mixed hyperlipidemia: Secondary | ICD-10-CM | POA: Diagnosis not present

## 2014-11-18 DIAGNOSIS — M47812 Spondylosis without myelopathy or radiculopathy, cervical region: Secondary | ICD-10-CM | POA: Diagnosis not present

## 2014-11-18 DIAGNOSIS — M47816 Spondylosis without myelopathy or radiculopathy, lumbar region: Secondary | ICD-10-CM | POA: Diagnosis not present

## 2014-11-18 DIAGNOSIS — M1990 Unspecified osteoarthritis, unspecified site: Secondary | ICD-10-CM | POA: Diagnosis not present

## 2014-11-18 DIAGNOSIS — M545 Low back pain: Secondary | ICD-10-CM | POA: Diagnosis not present

## 2014-11-19 DIAGNOSIS — K219 Gastro-esophageal reflux disease without esophagitis: Secondary | ICD-10-CM | POA: Diagnosis not present

## 2014-11-19 DIAGNOSIS — M797 Fibromyalgia: Secondary | ICD-10-CM | POA: Diagnosis not present

## 2014-11-19 DIAGNOSIS — I2699 Other pulmonary embolism without acute cor pulmonale: Secondary | ICD-10-CM | POA: Diagnosis not present

## 2014-11-19 DIAGNOSIS — Z23 Encounter for immunization: Secondary | ICD-10-CM | POA: Diagnosis not present

## 2014-11-19 DIAGNOSIS — I10 Essential (primary) hypertension: Secondary | ICD-10-CM | POA: Diagnosis not present

## 2014-11-19 DIAGNOSIS — E6609 Other obesity due to excess calories: Secondary | ICD-10-CM | POA: Diagnosis not present

## 2014-11-19 DIAGNOSIS — M47812 Spondylosis without myelopathy or radiculopathy, cervical region: Secondary | ICD-10-CM | POA: Diagnosis not present

## 2014-11-19 DIAGNOSIS — E782 Mixed hyperlipidemia: Secondary | ICD-10-CM | POA: Diagnosis not present

## 2014-12-30 DIAGNOSIS — M47816 Spondylosis without myelopathy or radiculopathy, lumbar region: Secondary | ICD-10-CM | POA: Diagnosis not present

## 2014-12-30 DIAGNOSIS — M47812 Spondylosis without myelopathy or radiculopathy, cervical region: Secondary | ICD-10-CM | POA: Diagnosis not present

## 2014-12-30 DIAGNOSIS — M797 Fibromyalgia: Secondary | ICD-10-CM | POA: Diagnosis not present

## 2014-12-30 DIAGNOSIS — M545 Low back pain: Secondary | ICD-10-CM | POA: Diagnosis not present

## 2015-02-02 NOTE — Addendum Note (Signed)
Addended by: Rudean Hitt on: 02/02/2015 10:18 AM   Modules accepted: Miquel Dunn

## 2015-02-03 DIAGNOSIS — M545 Low back pain: Secondary | ICD-10-CM | POA: Diagnosis not present

## 2015-02-18 DIAGNOSIS — R05 Cough: Secondary | ICD-10-CM | POA: Diagnosis not present

## 2015-02-18 DIAGNOSIS — J209 Acute bronchitis, unspecified: Secondary | ICD-10-CM | POA: Diagnosis not present

## 2015-02-18 DIAGNOSIS — J019 Acute sinusitis, unspecified: Secondary | ICD-10-CM | POA: Diagnosis not present

## 2015-02-27 DIAGNOSIS — K219 Gastro-esophageal reflux disease without esophagitis: Secondary | ICD-10-CM | POA: Diagnosis not present

## 2015-02-27 DIAGNOSIS — R110 Nausea: Secondary | ICD-10-CM | POA: Diagnosis not present

## 2015-02-27 DIAGNOSIS — K529 Noninfective gastroenteritis and colitis, unspecified: Secondary | ICD-10-CM | POA: Diagnosis not present

## 2015-02-27 DIAGNOSIS — Z86718 Personal history of other venous thrombosis and embolism: Secondary | ICD-10-CM | POA: Diagnosis not present

## 2015-02-27 DIAGNOSIS — M797 Fibromyalgia: Secondary | ICD-10-CM | POA: Diagnosis not present

## 2015-02-27 DIAGNOSIS — Z7901 Long term (current) use of anticoagulants: Secondary | ICD-10-CM | POA: Diagnosis not present

## 2015-02-27 DIAGNOSIS — R1084 Generalized abdominal pain: Secondary | ICD-10-CM | POA: Diagnosis not present

## 2015-02-27 DIAGNOSIS — K2970 Gastritis, unspecified, without bleeding: Secondary | ICD-10-CM | POA: Diagnosis not present

## 2015-02-27 DIAGNOSIS — Z86711 Personal history of pulmonary embolism: Secondary | ICD-10-CM | POA: Diagnosis not present

## 2015-02-27 DIAGNOSIS — R1010 Upper abdominal pain, unspecified: Secondary | ICD-10-CM | POA: Diagnosis not present

## 2015-02-27 DIAGNOSIS — Z79899 Other long term (current) drug therapy: Secondary | ICD-10-CM | POA: Diagnosis not present

## 2015-02-27 DIAGNOSIS — F329 Major depressive disorder, single episode, unspecified: Secondary | ICD-10-CM | POA: Diagnosis not present

## 2015-03-03 DIAGNOSIS — Z96653 Presence of artificial knee joint, bilateral: Secondary | ICD-10-CM | POA: Diagnosis not present

## 2015-03-03 DIAGNOSIS — M1990 Unspecified osteoarthritis, unspecified site: Secondary | ICD-10-CM | POA: Diagnosis not present

## 2015-03-03 DIAGNOSIS — M797 Fibromyalgia: Secondary | ICD-10-CM | POA: Diagnosis not present

## 2015-03-03 DIAGNOSIS — E6609 Other obesity due to excess calories: Secondary | ICD-10-CM | POA: Diagnosis not present

## 2015-03-03 DIAGNOSIS — M47816 Spondylosis without myelopathy or radiculopathy, lumbar region: Secondary | ICD-10-CM | POA: Diagnosis not present

## 2015-03-03 DIAGNOSIS — M5030 Other cervical disc degeneration, unspecified cervical region: Secondary | ICD-10-CM | POA: Diagnosis not present

## 2015-03-03 DIAGNOSIS — J301 Allergic rhinitis due to pollen: Secondary | ICD-10-CM | POA: Diagnosis not present

## 2015-03-03 DIAGNOSIS — Z86711 Personal history of pulmonary embolism: Secondary | ICD-10-CM | POA: Diagnosis not present

## 2015-03-03 DIAGNOSIS — K219 Gastro-esophageal reflux disease without esophagitis: Secondary | ICD-10-CM | POA: Diagnosis not present

## 2015-03-03 DIAGNOSIS — M545 Low back pain: Secondary | ICD-10-CM | POA: Diagnosis not present

## 2015-03-03 DIAGNOSIS — E782 Mixed hyperlipidemia: Secondary | ICD-10-CM | POA: Diagnosis not present

## 2015-03-03 DIAGNOSIS — J40 Bronchitis, not specified as acute or chronic: Secondary | ICD-10-CM | POA: Diagnosis not present

## 2015-03-03 DIAGNOSIS — M47812 Spondylosis without myelopathy or radiculopathy, cervical region: Secondary | ICD-10-CM | POA: Diagnosis not present

## 2015-03-03 DIAGNOSIS — I10 Essential (primary) hypertension: Secondary | ICD-10-CM | POA: Diagnosis not present

## 2015-04-06 DIAGNOSIS — M545 Low back pain: Secondary | ICD-10-CM | POA: Diagnosis not present

## 2015-04-13 DIAGNOSIS — Z79891 Long term (current) use of opiate analgesic: Secondary | ICD-10-CM | POA: Diagnosis not present

## 2015-04-14 DIAGNOSIS — H04123 Dry eye syndrome of bilateral lacrimal glands: Secondary | ICD-10-CM | POA: Diagnosis not present

## 2015-05-03 DIAGNOSIS — J111 Influenza due to unidentified influenza virus with other respiratory manifestations: Secondary | ICD-10-CM | POA: Diagnosis not present

## 2015-05-14 DIAGNOSIS — E782 Mixed hyperlipidemia: Secondary | ICD-10-CM | POA: Diagnosis not present

## 2015-05-14 DIAGNOSIS — K219 Gastro-esophageal reflux disease without esophagitis: Secondary | ICD-10-CM | POA: Diagnosis not present

## 2015-05-14 DIAGNOSIS — I10 Essential (primary) hypertension: Secondary | ICD-10-CM | POA: Diagnosis not present

## 2015-05-20 DIAGNOSIS — E782 Mixed hyperlipidemia: Secondary | ICD-10-CM | POA: Diagnosis not present

## 2015-05-20 DIAGNOSIS — I10 Essential (primary) hypertension: Secondary | ICD-10-CM | POA: Diagnosis not present

## 2015-05-20 DIAGNOSIS — M797 Fibromyalgia: Secondary | ICD-10-CM | POA: Diagnosis not present

## 2015-05-20 DIAGNOSIS — J301 Allergic rhinitis due to pollen: Secondary | ICD-10-CM | POA: Diagnosis not present

## 2015-05-20 DIAGNOSIS — E6609 Other obesity due to excess calories: Secondary | ICD-10-CM | POA: Diagnosis not present

## 2015-05-20 DIAGNOSIS — K219 Gastro-esophageal reflux disease without esophagitis: Secondary | ICD-10-CM | POA: Diagnosis not present

## 2015-05-20 DIAGNOSIS — R7301 Impaired fasting glucose: Secondary | ICD-10-CM | POA: Diagnosis not present

## 2015-06-18 DIAGNOSIS — M1990 Unspecified osteoarthritis, unspecified site: Secondary | ICD-10-CM | POA: Diagnosis not present

## 2015-06-18 DIAGNOSIS — M47816 Spondylosis without myelopathy or radiculopathy, lumbar region: Secondary | ICD-10-CM | POA: Diagnosis not present

## 2015-06-18 DIAGNOSIS — M545 Low back pain: Secondary | ICD-10-CM | POA: Diagnosis not present

## 2015-06-18 DIAGNOSIS — M47812 Spondylosis without myelopathy or radiculopathy, cervical region: Secondary | ICD-10-CM | POA: Diagnosis not present

## 2015-07-02 DIAGNOSIS — Z1231 Encounter for screening mammogram for malignant neoplasm of breast: Secondary | ICD-10-CM | POA: Diagnosis not present

## 2015-07-06 DIAGNOSIS — Z79899 Other long term (current) drug therapy: Secondary | ICD-10-CM | POA: Diagnosis not present

## 2015-07-06 DIAGNOSIS — M5030 Other cervical disc degeneration, unspecified cervical region: Secondary | ICD-10-CM | POA: Diagnosis not present

## 2015-07-06 DIAGNOSIS — M47812 Spondylosis without myelopathy or radiculopathy, cervical region: Secondary | ICD-10-CM | POA: Diagnosis not present

## 2015-07-06 DIAGNOSIS — M47816 Spondylosis without myelopathy or radiculopathy, lumbar region: Secondary | ICD-10-CM | POA: Diagnosis not present

## 2015-07-06 DIAGNOSIS — Z96653 Presence of artificial knee joint, bilateral: Secondary | ICD-10-CM | POA: Diagnosis not present

## 2015-07-06 DIAGNOSIS — M1990 Unspecified osteoarthritis, unspecified site: Secondary | ICD-10-CM | POA: Diagnosis not present

## 2015-07-06 DIAGNOSIS — Z7901 Long term (current) use of anticoagulants: Secondary | ICD-10-CM | POA: Diagnosis not present

## 2015-07-06 DIAGNOSIS — M797 Fibromyalgia: Secondary | ICD-10-CM | POA: Diagnosis not present

## 2015-07-06 DIAGNOSIS — M545 Low back pain: Secondary | ICD-10-CM | POA: Diagnosis not present

## 2015-07-13 DIAGNOSIS — J019 Acute sinusitis, unspecified: Secondary | ICD-10-CM | POA: Diagnosis not present

## 2015-07-13 DIAGNOSIS — J209 Acute bronchitis, unspecified: Secondary | ICD-10-CM | POA: Diagnosis not present

## 2015-09-21 DIAGNOSIS — K219 Gastro-esophageal reflux disease without esophagitis: Secondary | ICD-10-CM | POA: Diagnosis not present

## 2015-09-21 DIAGNOSIS — E6609 Other obesity due to excess calories: Secondary | ICD-10-CM | POA: Diagnosis not present

## 2015-09-21 DIAGNOSIS — I10 Essential (primary) hypertension: Secondary | ICD-10-CM | POA: Diagnosis not present

## 2015-09-21 DIAGNOSIS — Z6841 Body Mass Index (BMI) 40.0 and over, adult: Secondary | ICD-10-CM | POA: Diagnosis not present

## 2015-09-21 DIAGNOSIS — R7301 Impaired fasting glucose: Secondary | ICD-10-CM | POA: Diagnosis not present

## 2015-09-21 DIAGNOSIS — J301 Allergic rhinitis due to pollen: Secondary | ICD-10-CM | POA: Diagnosis not present

## 2015-09-21 DIAGNOSIS — E782 Mixed hyperlipidemia: Secondary | ICD-10-CM | POA: Diagnosis not present

## 2015-09-21 DIAGNOSIS — M797 Fibromyalgia: Secondary | ICD-10-CM | POA: Diagnosis not present

## 2015-09-28 DIAGNOSIS — Z6841 Body Mass Index (BMI) 40.0 and over, adult: Secondary | ICD-10-CM | POA: Diagnosis not present

## 2015-09-28 DIAGNOSIS — M722 Plantar fascial fibromatosis: Secondary | ICD-10-CM | POA: Diagnosis not present

## 2015-12-09 DIAGNOSIS — Z6841 Body Mass Index (BMI) 40.0 and over, adult: Secondary | ICD-10-CM | POA: Diagnosis not present

## 2015-12-09 DIAGNOSIS — E6609 Other obesity due to excess calories: Secondary | ICD-10-CM | POA: Diagnosis not present

## 2015-12-09 DIAGNOSIS — M797 Fibromyalgia: Secondary | ICD-10-CM | POA: Diagnosis not present

## 2015-12-13 DIAGNOSIS — M797 Fibromyalgia: Secondary | ICD-10-CM | POA: Diagnosis not present

## 2015-12-13 DIAGNOSIS — Z79899 Other long term (current) drug therapy: Secondary | ICD-10-CM | POA: Diagnosis not present

## 2015-12-13 DIAGNOSIS — G8929 Other chronic pain: Secondary | ICD-10-CM | POA: Diagnosis not present

## 2015-12-13 DIAGNOSIS — M5412 Radiculopathy, cervical region: Secondary | ICD-10-CM | POA: Diagnosis not present

## 2015-12-13 DIAGNOSIS — M5136 Other intervertebral disc degeneration, lumbar region: Secondary | ICD-10-CM | POA: Diagnosis not present

## 2015-12-13 DIAGNOSIS — R202 Paresthesia of skin: Secondary | ICD-10-CM | POA: Diagnosis not present

## 2015-12-13 DIAGNOSIS — M47816 Spondylosis without myelopathy or radiculopathy, lumbar region: Secondary | ICD-10-CM | POA: Diagnosis not present

## 2016-01-11 DIAGNOSIS — M47816 Spondylosis without myelopathy or radiculopathy, lumbar region: Secondary | ICD-10-CM | POA: Diagnosis not present

## 2016-01-11 DIAGNOSIS — M545 Low back pain: Secondary | ICD-10-CM | POA: Diagnosis not present

## 2016-01-11 DIAGNOSIS — M47817 Spondylosis without myelopathy or radiculopathy, lumbosacral region: Secondary | ICD-10-CM | POA: Diagnosis not present

## 2016-01-18 DIAGNOSIS — M47817 Spondylosis without myelopathy or radiculopathy, lumbosacral region: Secondary | ICD-10-CM | POA: Diagnosis not present

## 2016-01-18 DIAGNOSIS — Z79891 Long term (current) use of opiate analgesic: Secondary | ICD-10-CM | POA: Diagnosis not present

## 2016-01-18 DIAGNOSIS — M545 Low back pain: Secondary | ICD-10-CM | POA: Diagnosis not present

## 2016-01-18 DIAGNOSIS — G8929 Other chronic pain: Secondary | ICD-10-CM | POA: Diagnosis not present

## 2016-01-18 DIAGNOSIS — M47816 Spondylosis without myelopathy or radiculopathy, lumbar region: Secondary | ICD-10-CM | POA: Diagnosis not present

## 2016-01-19 DIAGNOSIS — I10 Essential (primary) hypertension: Secondary | ICD-10-CM | POA: Diagnosis not present

## 2016-01-19 DIAGNOSIS — E782 Mixed hyperlipidemia: Secondary | ICD-10-CM | POA: Diagnosis not present

## 2016-01-19 DIAGNOSIS — I2699 Other pulmonary embolism without acute cor pulmonale: Secondary | ICD-10-CM | POA: Diagnosis not present

## 2016-01-19 DIAGNOSIS — Z9189 Other specified personal risk factors, not elsewhere classified: Secondary | ICD-10-CM | POA: Diagnosis not present

## 2016-01-19 DIAGNOSIS — K210 Gastro-esophageal reflux disease with esophagitis: Secondary | ICD-10-CM | POA: Diagnosis not present

## 2016-01-19 DIAGNOSIS — M47812 Spondylosis without myelopathy or radiculopathy, cervical region: Secondary | ICD-10-CM | POA: Diagnosis not present

## 2016-01-19 DIAGNOSIS — M797 Fibromyalgia: Secondary | ICD-10-CM | POA: Diagnosis not present

## 2016-01-24 DIAGNOSIS — Z6839 Body mass index (BMI) 39.0-39.9, adult: Secondary | ICD-10-CM | POA: Diagnosis not present

## 2016-01-24 DIAGNOSIS — I10 Essential (primary) hypertension: Secondary | ICD-10-CM | POA: Diagnosis not present

## 2016-01-24 DIAGNOSIS — Z1212 Encounter for screening for malignant neoplasm of rectum: Secondary | ICD-10-CM | POA: Diagnosis not present

## 2016-01-24 DIAGNOSIS — E782 Mixed hyperlipidemia: Secondary | ICD-10-CM | POA: Diagnosis not present

## 2016-01-24 DIAGNOSIS — E6609 Other obesity due to excess calories: Secondary | ICD-10-CM | POA: Diagnosis not present

## 2016-01-24 DIAGNOSIS — Z0001 Encounter for general adult medical examination with abnormal findings: Secondary | ICD-10-CM | POA: Diagnosis not present

## 2016-01-24 DIAGNOSIS — Z1389 Encounter for screening for other disorder: Secondary | ICD-10-CM | POA: Diagnosis not present

## 2016-01-24 DIAGNOSIS — Z23 Encounter for immunization: Secondary | ICD-10-CM | POA: Diagnosis not present

## 2016-02-13 DIAGNOSIS — M79605 Pain in left leg: Secondary | ICD-10-CM | POA: Diagnosis not present

## 2016-02-13 DIAGNOSIS — K219 Gastro-esophageal reflux disease without esophagitis: Secondary | ICD-10-CM | POA: Diagnosis not present

## 2016-02-13 DIAGNOSIS — M545 Low back pain: Secondary | ICD-10-CM | POA: Diagnosis not present

## 2016-02-13 DIAGNOSIS — F329 Major depressive disorder, single episode, unspecified: Secondary | ICD-10-CM | POA: Diagnosis not present

## 2016-02-13 DIAGNOSIS — Z86718 Personal history of other venous thrombosis and embolism: Secondary | ICD-10-CM | POA: Diagnosis not present

## 2016-02-13 DIAGNOSIS — M797 Fibromyalgia: Secondary | ICD-10-CM | POA: Diagnosis not present

## 2016-02-13 DIAGNOSIS — M79604 Pain in right leg: Secondary | ICD-10-CM | POA: Diagnosis not present

## 2016-02-13 DIAGNOSIS — Z7901 Long term (current) use of anticoagulants: Secondary | ICD-10-CM | POA: Diagnosis not present

## 2016-02-13 DIAGNOSIS — M1990 Unspecified osteoarthritis, unspecified site: Secondary | ICD-10-CM | POA: Diagnosis not present

## 2016-02-21 DIAGNOSIS — Z79891 Long term (current) use of opiate analgesic: Secondary | ICD-10-CM | POA: Diagnosis not present

## 2016-02-21 DIAGNOSIS — M47817 Spondylosis without myelopathy or radiculopathy, lumbosacral region: Secondary | ICD-10-CM | POA: Diagnosis not present

## 2016-02-21 DIAGNOSIS — M5412 Radiculopathy, cervical region: Secondary | ICD-10-CM | POA: Diagnosis not present

## 2016-02-21 DIAGNOSIS — G8929 Other chronic pain: Secondary | ICD-10-CM | POA: Diagnosis not present

## 2016-02-24 DIAGNOSIS — E782 Mixed hyperlipidemia: Secondary | ICD-10-CM | POA: Diagnosis not present

## 2016-02-24 DIAGNOSIS — R7301 Impaired fasting glucose: Secondary | ICD-10-CM | POA: Diagnosis not present

## 2016-02-24 DIAGNOSIS — J301 Allergic rhinitis due to pollen: Secondary | ICD-10-CM | POA: Diagnosis not present

## 2016-02-24 DIAGNOSIS — M797 Fibromyalgia: Secondary | ICD-10-CM | POA: Diagnosis not present

## 2016-02-24 DIAGNOSIS — E6609 Other obesity due to excess calories: Secondary | ICD-10-CM | POA: Diagnosis not present

## 2016-02-24 DIAGNOSIS — K219 Gastro-esophageal reflux disease without esophagitis: Secondary | ICD-10-CM | POA: Diagnosis not present

## 2016-02-24 DIAGNOSIS — I10 Essential (primary) hypertension: Secondary | ICD-10-CM | POA: Diagnosis not present

## 2016-03-09 DIAGNOSIS — J209 Acute bronchitis, unspecified: Secondary | ICD-10-CM | POA: Diagnosis not present

## 2016-03-17 DIAGNOSIS — R05 Cough: Secondary | ICD-10-CM | POA: Diagnosis not present

## 2016-03-17 DIAGNOSIS — J019 Acute sinusitis, unspecified: Secondary | ICD-10-CM | POA: Diagnosis not present

## 2016-03-17 DIAGNOSIS — Z6839 Body mass index (BMI) 39.0-39.9, adult: Secondary | ICD-10-CM | POA: Diagnosis not present

## 2016-03-28 DIAGNOSIS — J0101 Acute recurrent maxillary sinusitis: Secondary | ICD-10-CM | POA: Diagnosis not present

## 2016-05-10 DIAGNOSIS — Z79891 Long term (current) use of opiate analgesic: Secondary | ICD-10-CM | POA: Diagnosis not present

## 2016-05-10 DIAGNOSIS — M47817 Spondylosis without myelopathy or radiculopathy, lumbosacral region: Secondary | ICD-10-CM | POA: Diagnosis not present

## 2016-05-10 DIAGNOSIS — G8929 Other chronic pain: Secondary | ICD-10-CM | POA: Diagnosis not present

## 2016-05-10 DIAGNOSIS — M47816 Spondylosis without myelopathy or radiculopathy, lumbar region: Secondary | ICD-10-CM | POA: Diagnosis not present

## 2016-05-18 ENCOUNTER — Ambulatory Visit (INDEPENDENT_AMBULATORY_CARE_PROVIDER_SITE_OTHER): Payer: Medicare HMO | Admitting: Otolaryngology

## 2016-05-18 DIAGNOSIS — J343 Hypertrophy of nasal turbinates: Secondary | ICD-10-CM | POA: Diagnosis not present

## 2016-05-18 DIAGNOSIS — J342 Deviated nasal septum: Secondary | ICD-10-CM

## 2016-05-18 DIAGNOSIS — J310 Chronic rhinitis: Secondary | ICD-10-CM

## 2016-05-19 ENCOUNTER — Other Ambulatory Visit (INDEPENDENT_AMBULATORY_CARE_PROVIDER_SITE_OTHER): Payer: Self-pay | Admitting: Otolaryngology

## 2016-05-19 DIAGNOSIS — J329 Chronic sinusitis, unspecified: Secondary | ICD-10-CM

## 2016-05-24 DIAGNOSIS — R7301 Impaired fasting glucose: Secondary | ICD-10-CM | POA: Diagnosis not present

## 2016-05-24 DIAGNOSIS — E782 Mixed hyperlipidemia: Secondary | ICD-10-CM | POA: Diagnosis not present

## 2016-05-24 DIAGNOSIS — K210 Gastro-esophageal reflux disease with esophagitis: Secondary | ICD-10-CM | POA: Diagnosis not present

## 2016-05-24 DIAGNOSIS — I10 Essential (primary) hypertension: Secondary | ICD-10-CM | POA: Diagnosis not present

## 2016-05-26 ENCOUNTER — Encounter (HOSPITAL_COMMUNITY): Payer: Self-pay | Admitting: Radiology

## 2016-05-26 ENCOUNTER — Ambulatory Visit (HOSPITAL_COMMUNITY)
Admission: RE | Admit: 2016-05-26 | Discharge: 2016-05-26 | Disposition: A | Discharge status: Home / Self Care | Payer: Medicare HMO | Source: Ambulatory Visit | Attending: Otolaryngology | Admitting: Otolaryngology

## 2016-05-26 DIAGNOSIS — J0101 Acute recurrent maxillary sinusitis: Secondary | ICD-10-CM | POA: Insufficient documentation

## 2016-05-26 DIAGNOSIS — J301 Allergic rhinitis due to pollen: Secondary | ICD-10-CM | POA: Diagnosis not present

## 2016-05-26 DIAGNOSIS — K219 Gastro-esophageal reflux disease without esophagitis: Secondary | ICD-10-CM | POA: Diagnosis not present

## 2016-05-26 DIAGNOSIS — E6609 Other obesity due to excess calories: Secondary | ICD-10-CM | POA: Diagnosis not present

## 2016-05-26 DIAGNOSIS — E782 Mixed hyperlipidemia: Secondary | ICD-10-CM | POA: Diagnosis not present

## 2016-05-26 DIAGNOSIS — I10 Essential (primary) hypertension: Secondary | ICD-10-CM | POA: Diagnosis not present

## 2016-05-26 DIAGNOSIS — M797 Fibromyalgia: Secondary | ICD-10-CM | POA: Diagnosis not present

## 2016-05-26 DIAGNOSIS — J329 Chronic sinusitis, unspecified: Secondary | ICD-10-CM

## 2016-05-26 DIAGNOSIS — Z23 Encounter for immunization: Secondary | ICD-10-CM | POA: Diagnosis not present

## 2016-05-30 DIAGNOSIS — Z0100 Encounter for examination of eyes and vision without abnormal findings: Secondary | ICD-10-CM | POA: Diagnosis not present

## 2016-05-30 DIAGNOSIS — H5210 Myopia, unspecified eye: Secondary | ICD-10-CM | POA: Diagnosis not present

## 2016-06-05 ENCOUNTER — Ambulatory Visit (INDEPENDENT_AMBULATORY_CARE_PROVIDER_SITE_OTHER): Payer: Medicare HMO | Admitting: Otolaryngology

## 2016-06-05 DIAGNOSIS — J322 Chronic ethmoidal sinusitis: Secondary | ICD-10-CM | POA: Diagnosis not present

## 2016-06-05 DIAGNOSIS — J343 Hypertrophy of nasal turbinates: Secondary | ICD-10-CM

## 2016-06-05 DIAGNOSIS — J320 Chronic maxillary sinusitis: Secondary | ICD-10-CM | POA: Diagnosis not present

## 2016-06-22 DIAGNOSIS — G5603 Carpal tunnel syndrome, bilateral upper limbs: Secondary | ICD-10-CM | POA: Diagnosis not present

## 2016-06-22 DIAGNOSIS — M5030 Other cervical disc degeneration, unspecified cervical region: Secondary | ICD-10-CM | POA: Diagnosis not present

## 2016-06-22 DIAGNOSIS — G894 Chronic pain syndrome: Secondary | ICD-10-CM | POA: Diagnosis not present

## 2016-06-22 DIAGNOSIS — Z79891 Long term (current) use of opiate analgesic: Secondary | ICD-10-CM | POA: Diagnosis not present

## 2016-06-22 DIAGNOSIS — M47817 Spondylosis without myelopathy or radiculopathy, lumbosacral region: Secondary | ICD-10-CM | POA: Diagnosis not present

## 2016-06-22 DIAGNOSIS — M797 Fibromyalgia: Secondary | ICD-10-CM | POA: Diagnosis not present

## 2016-06-22 DIAGNOSIS — M47812 Spondylosis without myelopathy or radiculopathy, cervical region: Secondary | ICD-10-CM | POA: Diagnosis not present

## 2016-07-06 ENCOUNTER — Other Ambulatory Visit: Payer: Self-pay | Admitting: Otolaryngology

## 2016-07-06 ENCOUNTER — Encounter (HOSPITAL_BASED_OUTPATIENT_CLINIC_OR_DEPARTMENT_OTHER): Payer: Self-pay | Admitting: *Deleted

## 2016-07-06 NOTE — Progress Notes (Signed)
Pt instructed by PCP Dr Quillian Quince to stop Xarelto 3 days prior to surgery with Dr. Benjamine Mola. Does not need to bridge.

## 2016-07-07 ENCOUNTER — Encounter (HOSPITAL_BASED_OUTPATIENT_CLINIC_OR_DEPARTMENT_OTHER)
Admission: RE | Admit: 2016-07-07 | Discharge: 2016-07-07 | Disposition: A | Discharge status: Home / Self Care | Payer: Medicare HMO | Source: Ambulatory Visit | Attending: Otolaryngology | Admitting: Otolaryngology

## 2016-07-07 ENCOUNTER — Other Ambulatory Visit: Payer: Self-pay

## 2016-07-07 DIAGNOSIS — J343 Hypertrophy of nasal turbinates: Secondary | ICD-10-CM | POA: Diagnosis not present

## 2016-07-07 DIAGNOSIS — R9431 Abnormal electrocardiogram [ECG] [EKG]: Secondary | ICD-10-CM | POA: Insufficient documentation

## 2016-07-07 DIAGNOSIS — J328 Other chronic sinusitis: Secondary | ICD-10-CM | POA: Diagnosis not present

## 2016-07-07 DIAGNOSIS — J329 Chronic sinusitis, unspecified: Secondary | ICD-10-CM | POA: Diagnosis present

## 2016-07-07 DIAGNOSIS — Z86718 Personal history of other venous thrombosis and embolism: Secondary | ICD-10-CM | POA: Diagnosis not present

## 2016-07-07 DIAGNOSIS — I252 Old myocardial infarction: Secondary | ICD-10-CM

## 2016-07-07 DIAGNOSIS — I10 Essential (primary) hypertension: Secondary | ICD-10-CM | POA: Insufficient documentation

## 2016-07-07 DIAGNOSIS — Z01810 Encounter for preprocedural cardiovascular examination: Secondary | ICD-10-CM | POA: Insufficient documentation

## 2016-07-07 DIAGNOSIS — J342 Deviated nasal septum: Secondary | ICD-10-CM | POA: Diagnosis not present

## 2016-07-07 DIAGNOSIS — J3489 Other specified disorders of nose and nasal sinuses: Secondary | ICD-10-CM | POA: Diagnosis not present

## 2016-07-07 DIAGNOSIS — J338 Other polyp of sinus: Secondary | ICD-10-CM | POA: Diagnosis not present

## 2016-07-07 DIAGNOSIS — K219 Gastro-esophageal reflux disease without esophagitis: Secondary | ICD-10-CM | POA: Diagnosis not present

## 2016-07-07 LAB — BASIC METABOLIC PANEL
Anion gap: 7 (ref 5–15)
BUN: 7 mg/dL (ref 6–20)
CO2: 25 mmol/L (ref 22–32)
Calcium: 9 mg/dL (ref 8.9–10.3)
Chloride: 106 mmol/L (ref 101–111)
Creatinine, Ser: 0.87 mg/dL (ref 0.44–1.00)
GFR calc Af Amer: >60 mL/min (ref >60)
GLUCOSE: 148 mg/dL — AB (ref 65–99)
POTASSIUM: 4.2 mmol/L (ref 3.5–5.1)
Sodium: 138 mmol/L (ref 135–145)

## 2016-07-07 NOTE — Progress Notes (Signed)
EKG reviewed by Dr. Turk, will proceed with surgery as scheduled. 

## 2016-07-10 ENCOUNTER — Ambulatory Visit (HOSPITAL_BASED_OUTPATIENT_CLINIC_OR_DEPARTMENT_OTHER)
Admission: RE | Admit: 2016-07-10 | Discharge: 2016-07-10 | Disposition: A | Discharge status: Home / Self Care | Payer: Medicare HMO | Source: Ambulatory Visit | Attending: Otolaryngology | Admitting: Otolaryngology

## 2016-07-10 ENCOUNTER — Encounter (HOSPITAL_BASED_OUTPATIENT_CLINIC_OR_DEPARTMENT_OTHER): Payer: Self-pay | Admitting: Anesthesiology

## 2016-07-10 ENCOUNTER — Ambulatory Visit (HOSPITAL_BASED_OUTPATIENT_CLINIC_OR_DEPARTMENT_OTHER): Payer: Medicare HMO | Admitting: Anesthesiology

## 2016-07-10 ENCOUNTER — Encounter (HOSPITAL_BASED_OUTPATIENT_CLINIC_OR_DEPARTMENT_OTHER)
Admission: RE | Disposition: A | Discharge status: Home / Self Care | Payer: Self-pay | Source: Ambulatory Visit | Attending: Otolaryngology

## 2016-07-10 DIAGNOSIS — J338 Other polyp of sinus: Secondary | ICD-10-CM | POA: Insufficient documentation

## 2016-07-10 DIAGNOSIS — J322 Chronic ethmoidal sinusitis: Secondary | ICD-10-CM | POA: Diagnosis not present

## 2016-07-10 DIAGNOSIS — J329 Chronic sinusitis, unspecified: Secondary | ICD-10-CM | POA: Diagnosis not present

## 2016-07-10 DIAGNOSIS — K219 Gastro-esophageal reflux disease without esophagitis: Secondary | ICD-10-CM | POA: Insufficient documentation

## 2016-07-10 DIAGNOSIS — J342 Deviated nasal septum: Secondary | ICD-10-CM | POA: Insufficient documentation

## 2016-07-10 DIAGNOSIS — Z86718 Personal history of other venous thrombosis and embolism: Secondary | ICD-10-CM | POA: Diagnosis not present

## 2016-07-10 DIAGNOSIS — J320 Chronic maxillary sinusitis: Secondary | ICD-10-CM | POA: Diagnosis not present

## 2016-07-10 DIAGNOSIS — J3489 Other specified disorders of nose and nasal sinuses: Secondary | ICD-10-CM | POA: Insufficient documentation

## 2016-07-10 DIAGNOSIS — J343 Hypertrophy of nasal turbinates: Secondary | ICD-10-CM | POA: Diagnosis not present

## 2016-07-10 DIAGNOSIS — J328 Other chronic sinusitis: Secondary | ICD-10-CM | POA: Diagnosis not present

## 2016-07-10 DIAGNOSIS — I10 Essential (primary) hypertension: Secondary | ICD-10-CM | POA: Diagnosis not present

## 2016-07-10 HISTORY — PX: ETHMOIDECTOMY: SHX5197

## 2016-07-10 HISTORY — DX: Other pulmonary embolism without acute cor pulmonale: I26.99

## 2016-07-10 HISTORY — PX: TURBINATE REDUCTION: SHX6157

## 2016-07-10 HISTORY — DX: Gastro-esophageal reflux disease without esophagitis: K21.9

## 2016-07-10 HISTORY — PX: MAXILLARY ANTROSTOMY: SHX2003

## 2016-07-10 HISTORY — DX: Acute embolism and thrombosis of left femoral vein: I82.412

## 2016-07-10 HISTORY — DX: Unspecified osteoarthritis, unspecified site: M19.90

## 2016-07-10 HISTORY — DX: Essential (primary) hypertension: I10

## 2016-07-10 HISTORY — DX: Fibromyalgia: M79.7

## 2016-07-10 SURGERY — REDUCTION, NASAL TURBINATE
Anesthesia: General | Site: Nose | Laterality: Right

## 2016-07-10 MED ORDER — ONDANSETRON HCL 4 MG/2ML IJ SOLN
INTRAMUSCULAR | Status: AC
  Filled 2016-07-10: qty 2

## 2016-07-10 MED ORDER — FENTANYL CITRATE (PF) 100 MCG/2ML IJ SOLN
25.0000 ug | INTRAMUSCULAR | Status: DC | PRN
  Administered 2016-07-10 (×3): 50 ug via INTRAVENOUS

## 2016-07-10 MED ORDER — DEXAMETHASONE SODIUM PHOSPHATE 10 MG/ML IJ SOLN
INTRAMUSCULAR | Status: AC
  Filled 2016-07-10: qty 1

## 2016-07-10 MED ORDER — MUPIROCIN 2 % EX OINT
TOPICAL_OINTMENT | CUTANEOUS | Status: DC | PRN
  Administered 2016-07-10: 1 via NASAL

## 2016-07-10 MED ORDER — FENTANYL CITRATE (PF) 100 MCG/2ML IJ SOLN
INTRAMUSCULAR | Status: AC
  Filled 2016-07-10: qty 2

## 2016-07-10 MED ORDER — PHENYLEPHRINE 40 MCG/ML (10ML) SYRINGE FOR IV PUSH (FOR BLOOD PRESSURE SUPPORT)
PREFILLED_SYRINGE | INTRAVENOUS | Status: AC
  Filled 2016-07-10: qty 10

## 2016-07-10 MED ORDER — METOCLOPRAMIDE HCL 5 MG/ML IJ SOLN
10.0000 mg | Freq: Once | INTRAMUSCULAR | Status: AC | PRN
  Administered 2016-07-10: 10 mg via INTRAVENOUS

## 2016-07-10 MED ORDER — MIDAZOLAM HCL 2 MG/2ML IJ SOLN
INTRAMUSCULAR | Status: AC
  Filled 2016-07-10: qty 2

## 2016-07-10 MED ORDER — SCOPOLAMINE 1 MG/3DAYS TD PT72
MEDICATED_PATCH | TRANSDERMAL | Status: AC
  Filled 2016-07-10: qty 1

## 2016-07-10 MED ORDER — OXYCODONE HCL 5 MG PO TABS
ORAL_TABLET | ORAL | Status: AC
Start: 2016-07-10 — End: 2016-07-10
  Filled 2016-07-10: qty 1

## 2016-07-10 MED ORDER — MIDAZOLAM HCL 5 MG/5ML IJ SOLN
INTRAMUSCULAR | Status: DC | PRN
  Administered 2016-07-10: 2 mg via INTRAVENOUS

## 2016-07-10 MED ORDER — SODIUM CHLORIDE 0.9 % IR SOLN
Status: DC | PRN
  Administered 2016-07-10: 200 mL

## 2016-07-10 MED ORDER — CEFAZOLIN SODIUM-DEXTROSE 2-4 GM/100ML-% IV SOLN
INTRAVENOUS | Status: AC
  Filled 2016-07-10: qty 100

## 2016-07-10 MED ORDER — LIDOCAINE 2% (20 MG/ML) 5 ML SYRINGE
INTRAMUSCULAR | Status: AC
  Filled 2016-07-10: qty 5

## 2016-07-10 MED ORDER — SUCCINYLCHOLINE CHLORIDE 200 MG/10ML IV SOSY
PREFILLED_SYRINGE | INTRAVENOUS | Status: AC
  Filled 2016-07-10: qty 10

## 2016-07-10 MED ORDER — MIDAZOLAM HCL 2 MG/2ML IJ SOLN: 1.0000 mg | INTRAMUSCULAR | Status: DC | PRN

## 2016-07-10 MED ORDER — FENTANYL CITRATE (PF) 100 MCG/2ML IJ SOLN
INTRAMUSCULAR | Status: DC | PRN
  Administered 2016-07-10 (×4): 25 ug via INTRAVENOUS
  Administered 2016-07-10: 100 ug via INTRAVENOUS

## 2016-07-10 MED ORDER — LACTATED RINGERS IV SOLN
INTRAVENOUS | Status: DC
  Administered 2016-07-10 (×3): via INTRAVENOUS

## 2016-07-10 MED ORDER — LACTATED RINGERS IV SOLN: INTRAVENOUS | Status: DC

## 2016-07-10 MED ORDER — OXYMETAZOLINE HCL 0.05 % NA SOLN
NASAL | Status: DC | PRN
  Administered 2016-07-10: 1 via TOPICAL

## 2016-07-10 MED ORDER — COCAINE HCL 4 % EX SOLN
CUTANEOUS | Status: DC | PRN
  Administered 2016-07-10: 1 mL via NASAL

## 2016-07-10 MED ORDER — CEFAZOLIN SODIUM-DEXTROSE 2-3 GM-% IV SOLR
INTRAVENOUS | Status: DC | PRN
  Administered 2016-07-10: 2 g via INTRAVENOUS
  Administered 2016-07-10: 1 g via INTRAVENOUS

## 2016-07-10 MED ORDER — EPHEDRINE 5 MG/ML INJ
INTRAVENOUS | Status: AC
  Filled 2016-07-10: qty 10

## 2016-07-10 MED ORDER — ONDANSETRON HCL 4 MG/2ML IJ SOLN
INTRAMUSCULAR | Status: DC | PRN
  Administered 2016-07-10: 4 mg via INTRAVENOUS

## 2016-07-10 MED ORDER — LIDOCAINE-EPINEPHRINE 1 %-1:100000 IJ SOLN
INTRAMUSCULAR | Status: AC
  Filled 2016-07-10: qty 1

## 2016-07-10 MED ORDER — MUPIROCIN 2 % EX OINT
TOPICAL_OINTMENT | CUTANEOUS | Status: AC
  Filled 2016-07-10: qty 22

## 2016-07-10 MED ORDER — METOCLOPRAMIDE HCL 5 MG/ML IJ SOLN
INTRAMUSCULAR | Status: AC
  Filled 2016-07-10: qty 2

## 2016-07-10 MED ORDER — OXYCODONE-ACETAMINOPHEN 5-325 MG PO TABS: 1.0000 | ORAL_TABLET | Freq: Four times a day (QID) | ORAL | 0 refills | Status: AC | PRN

## 2016-07-10 MED ORDER — OXYCODONE HCL 5 MG PO TABS
5.0000 mg | ORAL_TABLET | Freq: Once | ORAL | Status: AC
  Administered 2016-07-10: 5 mg via ORAL

## 2016-07-10 MED ORDER — SUCCINYLCHOLINE CHLORIDE 20 MG/ML IJ SOLN
INTRAMUSCULAR | Status: DC | PRN
  Administered 2016-07-10: 50 mg via INTRAVENOUS

## 2016-07-10 MED ORDER — FENTANYL CITRATE (PF) 100 MCG/2ML IJ SOLN: 50.0000 ug | INTRAMUSCULAR | Status: DC | PRN

## 2016-07-10 MED ORDER — COCAINE HCL 4 % EX SOLN
CUTANEOUS | Status: AC
  Filled 2016-07-10: qty 4

## 2016-07-10 MED ORDER — MEPERIDINE HCL 25 MG/ML IJ SOLN: 6.2500 mg | INTRAMUSCULAR | Status: DC | PRN

## 2016-07-10 MED ORDER — AMOXICILLIN 875 MG PO TABS: 875.0000 mg | ORAL_TABLET | Freq: Two times a day (BID) | ORAL | 0 refills | Status: DC

## 2016-07-10 MED ORDER — SCOPOLAMINE 1 MG/3DAYS TD PT72
1.0000 | MEDICATED_PATCH | Freq: Once | TRANSDERMAL | Status: DC | PRN
  Administered 2016-07-10: 1.5 mg via TRANSDERMAL

## 2016-07-10 MED ORDER — DEXAMETHASONE SODIUM PHOSPHATE 4 MG/ML IJ SOLN
INTRAMUSCULAR | Status: DC | PRN
  Administered 2016-07-10: 10 mg via INTRAVENOUS

## 2016-07-10 MED ORDER — PHENYLEPHRINE HCL 10 MG/ML IJ SOLN
INTRAMUSCULAR | Status: DC | PRN
  Administered 2016-07-10 (×2): 80 ug via INTRAVENOUS

## 2016-07-10 MED ORDER — PROPOFOL 10 MG/ML IV BOLUS
INTRAVENOUS | Status: DC | PRN
  Administered 2016-07-10: 200 mg via INTRAVENOUS

## 2016-07-10 SURGICAL SUPPLY — 39 items
ATTRACTOMAT 16X20 MAGNETIC DRP (DRAPES) IMPLANT
BLADE RAD40 ROTATE 4M 4 5PK (BLADE) IMPLANT
BLADE RAD60 ROTATE M4 4 5PK (BLADE) IMPLANT
BLADE TRICUT ROTATE M4 4 5PK (BLADE) ×3 IMPLANT
BUR HS RAD FRONTAL 3 (BURR) IMPLANT
CANISTER SUC SOCK COL 7IN (MISCELLANEOUS) ×3 IMPLANT
CANISTER SUCT 1200ML W/VALVE (MISCELLANEOUS) ×3 IMPLANT
COAGULATOR SUCT 8FR VV (MISCELLANEOUS) ×3 IMPLANT
COAGULATOR SUCT SWTCH 10FR 6 (ELECTROSURGICAL) IMPLANT
DECANTER SPIKE VIAL GLASS SM (MISCELLANEOUS) IMPLANT
DRSG NASAL KENNEDY LMNT 8CM (GAUZE/BANDAGES/DRESSINGS) IMPLANT
DRSG NASOPORE 8CM (GAUZE/BANDAGES/DRESSINGS) ×3 IMPLANT
DRSG TELFA 3X8 NADH (GAUZE/BANDAGES/DRESSINGS) IMPLANT
ELECT REM PT RETURN 9FT ADLT (ELECTROSURGICAL) ×3
ELECTRODE REM PT RTRN 9FT ADLT (ELECTROSURGICAL) ×2 IMPLANT
GLOVE BIO SURGEON STRL SZ7.5 (GLOVE) IMPLANT
GLOVE SURG SS PI 7.0 STRL IVOR (GLOVE) ×3 IMPLANT
GOWN STRL REUS W/ TWL LRG LVL3 (GOWN DISPOSABLE) ×4 IMPLANT
GOWN STRL REUS W/TWL LRG LVL3 (GOWN DISPOSABLE) ×2
HEMOSTAT SURGICEL 2X14 (HEMOSTASIS) IMPLANT
IV NS 500ML (IV SOLUTION) ×1
IV NS 500ML BAXH (IV SOLUTION) ×2 IMPLANT
NEEDLE HYPO 25X1 1.5 SAFETY (NEEDLE) IMPLANT
NEEDLE PRECISIONGLIDE 27X1.5 (NEEDLE) ×3 IMPLANT
NEEDLE SPNL 25GX3.5 QUINCKE BL (NEEDLE) IMPLANT
NS IRRIG 1000ML POUR BTL (IV SOLUTION) ×3 IMPLANT
PACK BASIN DAY SURGERY FS (CUSTOM PROCEDURE TRAY) ×3 IMPLANT
PACK ENT DAY SURGERY (CUSTOM PROCEDURE TRAY) ×3 IMPLANT
PACKING NASAL EPIS 4X2.4 XEROG (MISCELLANEOUS) IMPLANT
PATTIES SURGICAL .5 X3 (DISPOSABLE) ×3 IMPLANT
SLEEVE SCD COMPRESS KNEE MED (MISCELLANEOUS) ×3 IMPLANT
SOLUTION BUTLER CLEAR DIP (MISCELLANEOUS) ×3 IMPLANT
SPLINT NASAL AIRWAY SILICONE (MISCELLANEOUS) IMPLANT
SPONGE GAUZE 2X2 8PLY STRL LF (GAUZE/BANDAGES/DRESSINGS) ×3 IMPLANT
SPONGE NEURO XRAY DETECT 1X3 (DISPOSABLE) ×3 IMPLANT
TOWEL OR 17X24 6PK STRL BLUE (TOWEL DISPOSABLE) ×3 IMPLANT
TUBE CONNECTING 20X1/4 (TUBING) ×3 IMPLANT
TUBE SALEM SUMP 16 FR W/ARV (TUBING) IMPLANT
YANKAUER SUCT BULB TIP NO VENT (SUCTIONS) IMPLANT

## 2016-07-10 NOTE — Discharge Instructions (Addendum)

## 2016-07-10 NOTE — H&P (Signed)
Cc: Recurrent sinusitis, chronic nasal obstruction  HPI: The patient is a 56 year old female who returns today for her follow-up evaluation.  She was last seen 3 weeks ago.  At that time, she was noted to have severe nasal mucosal congestion, nasal septal deviation and bilateral inferior turbinate hypertrophy. She also has a history of frequent recurrent sinusitis.  She was treated with multiple courses of antibiotics and steroids.  Her posttreatment CT scan showed complete opacification of the right maxillary sinus with high density contents.  In addition, she was also noted to have partial opacification of her right anterior ethmoid and severely hypertrophied inferior turbinates bilaterally.  The patient returns complaining of persistent nasal congestion and right-sided facial pain.  She also complains of foul smelling odor from her right maxillary sinus.   Exam: The nasal cavities were decongested and anesthetised with a combination of oxymetazoline and 4% lidocaine solution.  The flexible scope was inserted into the right nasal cavity. Endoscopy of the inferior and middle meatus was performed.  The edematous mucosa was noted.  The right middle meatus was inflammed. Olfactory cleft was clear.  Nasopharynx was clear.  Turbinates were hypertrophied but without mass.  Incomplete response to decongestion.  The procedure was repeated on the contralateral side.  The patient tolerated the procedure well.  Instructions were given to avoid eating or drinking for 2 hours.   Assessment: 1.  Chronic right maxillary and anterior ethmoid sinusitis.  The CT findings are concerning for possible chronic fungal sinusitis.  2.  The patient also has severe nasal mucosal congestion and bilateral inferior turbinate hypertrophy, causing bilateral severe nasal obstruction.   Plan: 1.  The nasal endoscopy findings and the CT images are extensively reviewed with the patient.  2.  The patient should continue with her daily  Flonase nasal spray and nasal saline irrigation.  3.  Based on the above findings, the patient will likely benefit from undergoing surgical intervention with right maxillary antrostomy and right anterior ethmoidectomy.  She will also benefit from undergoing bilateral inferior turbinate reduction to improve her nasal passageways.   4.  The risks, benefits, alternatives and details of the procedure are reviewed.  5.  The patient would like to proceed with the procedures.

## 2016-07-10 NOTE — Anesthesia Procedure Notes (Signed)
Procedure Name: Intubation Date/Time: 07/10/2016 12:38 PM Performed by: Marrianne Mood Pre-anesthesia Checklist: Patient identified, Emergency Drugs available, Suction available, Patient being monitored and Timeout performed Patient Re-evaluated:Patient Re-evaluated prior to inductionOxygen Delivery Method: Circle system utilized Preoxygenation: Pre-oxygenation with 100% oxygen Intubation Type: IV induction Ventilation: Mask ventilation without difficulty Laryngoscope Size: Miller and 3 Grade View: Grade II Tube type: Oral Tube size: 7.0 mm Number of attempts: 1 Airway Equipment and Method: Stylet and Oral airway Placement Confirmation: ETT inserted through vocal cords under direct vision,  positive ETCO2 and breath sounds checked- equal and bilateral Secured at: 21 cm Tube secured with: Tape Dental Injury: Teeth and Oropharynx as per pre-operative assessment

## 2016-07-10 NOTE — Anesthesia Preprocedure Evaluation (Signed)
Anesthesia Evaluation  Patient identified by MRN, date of birth, ID band Patient awake    Reviewed: Allergy & Precautions, NPO status , Patient's Chart, lab work & pertinent test results  Airway Mallampati: II  TM Distance: >3 FB Neck ROM: Full    Dental no notable dental hx.    Pulmonary PE   Pulmonary exam normal breath sounds clear to auscultation       Cardiovascular hypertension, Pt. on medications + DVT  Normal cardiovascular exam Rhythm:Regular Rate:Normal     Neuro/Psych negative neurological ROS  negative psych ROS   GI/Hepatic Neg liver ROS, GERD  Medicated and Controlled,  Endo/Other  negative endocrine ROS  Renal/GU negative Renal ROS  negative genitourinary   Musculoskeletal  (+) Fibromyalgia -  Abdominal   Peds negative pediatric ROS (+)  Hematology negative hematology ROS (+)   Anesthesia Other Findings   Reproductive/Obstetrics negative OB ROS                             Anesthesia Physical Anesthesia Plan  ASA: II  Anesthesia Plan: General   Post-op Pain Management:    Induction: Intravenous  Airway Management Planned: Oral ETT  Additional Equipment:   Intra-op Plan:   Post-operative Plan: Extubation in OR  Informed Consent: I have reviewed the patients History and Physical, chart, labs and discussed the procedure including the risks, benefits and alternatives for the proposed anesthesia with the patient or authorized representative who has indicated his/her understanding and acceptance.   Dental advisory given  Plan Discussed with: CRNA  Anesthesia Plan Comments:         Anesthesia Quick Evaluation

## 2016-07-10 NOTE — Transfer of Care (Signed)
Immediate Anesthesia Transfer of Care Note  Patient: Theresa Lucas  Procedure(s) Performed: Procedure(s): BILATERAL TURBINATE REDUCTION (Bilateral) RIGHT ANTERIOR ETHMOIDECTOMY (Right) RIGHT MAXILLARY ANTROSTOMY AND TISSUE REMOVAL (Right)  Patient Location: PACU  Anesthesia Type:General  Level of Consciousness: sedated  Airway & Oxygen Therapy: Patient Spontanous Breathing and Patient connected to face mask oxygen  Post-op Assessment: Report given to RN and Post -op Vital signs reviewed and stable  Post vital signs: Reviewed and stable  Last Vitals:  Vitals:   07/10/16 1344 07/10/16 1345  BP:    Pulse: 78 76  Resp: 14 13  Temp:      Last Pain:  Vitals:   07/10/16 1024  TempSrc: Oral         Complications: No apparent anesthesia complications

## 2016-07-10 NOTE — Anesthesia Postprocedure Evaluation (Signed)
Anesthesia Post Note  Patient: Theresa Lucas  Procedure(s) Performed: Procedure(s) (LRB): BILATERAL TURBINATE REDUCTION (Bilateral) RIGHT ANTERIOR ETHMOIDECTOMY (Right) RIGHT MAXILLARY ANTROSTOMY AND TISSUE REMOVAL (Right)  Patient location during evaluation: PACU Anesthesia Type: General Level of consciousness: awake and alert Pain management: pain level controlled Vital Signs Assessment: post-procedure vital signs reviewed and stable Respiratory status: spontaneous breathing, nonlabored ventilation, respiratory function stable and patient connected to nasal cannula oxygen Cardiovascular status: blood pressure returned to baseline and stable Postop Assessment: no signs of nausea or vomiting Anesthetic complications: no       Last Vitals:  Vitals:   07/10/16 1415 07/10/16 1430  BP: (!) 153/74 (!) 142/100  Pulse: 67 69  Resp: 19 14  Temp:      Last Pain:  Vitals:   07/10/16 1445  TempSrc:   PainSc: 3                  Montez Hageman

## 2016-07-10 NOTE — Op Note (Signed)
DATE OF PROCEDURE: 07/10/2016  OPERATIVE REPORT   SURGEON: Leta Baptist, MD   PREOPERATIVE DIAGNOSES:  1. Chronic nasal obstruction.  2. Bilateral inferior turbinate hypertrophy.  3. Chronic right maxillary and ethmoid sinusitis.  POSTOPERATIVE DIAGNOSES:  1. Chronic nasal obstruction.  2. Bilateral inferior turbinate hypertrophy.  3. Chronic right maxillary and ethmoid sinusitis.  PROCEDURE PERFORMED:  1. Endoscopic right maxillary antrostomy with polyp removal 2. Endoscopic right anterior ethmoidectomy. 3. Bilateral partial inferior turbinate resection.   ANESTHESIA: General endotracheal tube anesthesia.   COMPLICATIONS: None.   ESTIMATED BLOOD LOSS: 100 mL  INDICATION FOR PROCEDURE :Theresa Lucas is a 56 y.o. female with a history of chronic nasal obstruction and frequent recurrent sinusitis. The patient was treated with multiple courses of antibiotics, antihistamine, decongestant, steroid nasal spray, and systemic steroids. However, the patient continued to be symptomatic. On her posttreatment CT scan, she was noted to have complete opacification of the right maxillary sinus with high density contents. In addition, she was also noted to have partial opacification of her right anterior ethmoid sinus. The inferior turbinates were severely hypertrophied, causing significant nasal obstruction. Based on the above findings, the decision was made for the patient to undergo the above-stated procedures. The risks, benefits, alternatives, and details of the procedure were discussed with the patient. Questions were invited and answered. Informed consent was obtained.   DESCRIPTION: The patient was taken to the operating room and placed supine on the operating table. General endotracheal tube anesthesia was administered by the anesthesiologist. The patient was positioned and prepped and draped in a standard fashion for nasal surgery. Pledgets soaked with Afrin were placed in both nasal  cavities. The pledgets were subsequently removed. Examination of the nasal cavities revealed bilateral severe inferior turbinate hypertrophy. The inferior one-half of each inferior turbinate was then crossclamped with a straight Kelly clamp. The inferior one-half of each inferior turbinate was then resected with a pair of cross cutting scissors. Hemostasis was achieved with suction electrocautery, under direct visual guidance of the zero-degree endoscope. Good hemostasis was achieved.   Attention was then focused on the right maxillary and ethmoid sinuses. Purulent drainage was noted from the right middle meatus. Photodocumentation was obtained. The right middle turbinate was carefully medialized. The uncinate process was resected. A large amount of polypoid tissue and purulent drainage were noted within the right maxillary sinus. The polypoid tissue was removed. The right maxillary sinus was copiously irrigated. The maxillary antrum was significantly enlarged. Attention was then focused on the ethmoid sinus. The bony walls of the anterior ethmoid sinus was taken down. Polypoid tissue was also removed from the ethmoid sinus. Hemostasis was achieved with a nasal pore packing.       The care of the patient was turned over to the anesthesiologist. The patient was awakened from anesthesia without difficulty. The patient was extubated and transferred to the recovery room in good condition.   OPERATIVE FINDINGS: Bilateral inferior turbinate hypertrophy.  Chronic rhinosinusitis and polyposis, involving the right maxillary and anterior ethmoid sinuses.   SPECIMEN:  Right sinus contents.  FOLLOWUP CARE: The patient will be discharged home once she is awake and alert. The patient will be placed onPercocet when necessary for pain, and amoxicillin 875 mg p.o. b.i.d. for 5 days. The patient will follow up in my office in approximately 1 week.   Leta Baptist, MD

## 2016-07-11 ENCOUNTER — Encounter (HOSPITAL_BASED_OUTPATIENT_CLINIC_OR_DEPARTMENT_OTHER): Payer: Self-pay | Admitting: Otolaryngology

## 2016-07-17 ENCOUNTER — Ambulatory Visit (INDEPENDENT_AMBULATORY_CARE_PROVIDER_SITE_OTHER): Payer: Medicare HMO | Admitting: Otolaryngology

## 2016-07-17 DIAGNOSIS — J322 Chronic ethmoidal sinusitis: Secondary | ICD-10-CM

## 2016-07-17 DIAGNOSIS — M47812 Spondylosis without myelopathy or radiculopathy, cervical region: Secondary | ICD-10-CM | POA: Diagnosis not present

## 2016-07-17 DIAGNOSIS — Z1231 Encounter for screening mammogram for malignant neoplasm of breast: Secondary | ICD-10-CM | POA: Diagnosis not present

## 2016-07-17 DIAGNOSIS — M542 Cervicalgia: Secondary | ICD-10-CM | POA: Diagnosis not present

## 2016-07-17 DIAGNOSIS — J320 Chronic maxillary sinusitis: Secondary | ICD-10-CM

## 2016-07-29 ENCOUNTER — Ambulatory Visit (INDEPENDENT_AMBULATORY_CARE_PROVIDER_SITE_OTHER): Payer: Medicare HMO | Admitting: Otolaryngology

## 2016-07-31 ENCOUNTER — Ambulatory Visit (INDEPENDENT_AMBULATORY_CARE_PROVIDER_SITE_OTHER): Payer: Medicare HMO | Admitting: Otolaryngology

## 2016-07-31 DIAGNOSIS — J320 Chronic maxillary sinusitis: Secondary | ICD-10-CM | POA: Diagnosis not present

## 2016-07-31 DIAGNOSIS — J322 Chronic ethmoidal sinusitis: Secondary | ICD-10-CM

## 2016-08-14 ENCOUNTER — Ambulatory Visit (INDEPENDENT_AMBULATORY_CARE_PROVIDER_SITE_OTHER): Payer: Medicare HMO | Admitting: Otolaryngology

## 2016-08-14 DIAGNOSIS — J322 Chronic ethmoidal sinusitis: Secondary | ICD-10-CM

## 2016-08-14 DIAGNOSIS — J320 Chronic maxillary sinusitis: Secondary | ICD-10-CM

## 2016-08-15 DIAGNOSIS — M5030 Other cervical disc degeneration, unspecified cervical region: Secondary | ICD-10-CM | POA: Diagnosis not present

## 2016-08-15 DIAGNOSIS — M47812 Spondylosis without myelopathy or radiculopathy, cervical region: Secondary | ICD-10-CM | POA: Diagnosis not present

## 2016-08-15 DIAGNOSIS — M797 Fibromyalgia: Secondary | ICD-10-CM | POA: Diagnosis not present

## 2016-08-15 DIAGNOSIS — Z79891 Long term (current) use of opiate analgesic: Secondary | ICD-10-CM | POA: Diagnosis not present

## 2016-08-15 DIAGNOSIS — G8929 Other chronic pain: Secondary | ICD-10-CM | POA: Diagnosis not present

## 2016-09-07 DIAGNOSIS — G894 Chronic pain syndrome: Secondary | ICD-10-CM | POA: Diagnosis not present

## 2016-09-07 DIAGNOSIS — M47812 Spondylosis without myelopathy or radiculopathy, cervical region: Secondary | ICD-10-CM | POA: Diagnosis not present

## 2016-09-07 DIAGNOSIS — Z79891 Long term (current) use of opiate analgesic: Secondary | ICD-10-CM | POA: Diagnosis not present

## 2016-09-07 DIAGNOSIS — M797 Fibromyalgia: Secondary | ICD-10-CM | POA: Diagnosis not present

## 2016-09-07 DIAGNOSIS — M47817 Spondylosis without myelopathy or radiculopathy, lumbosacral region: Secondary | ICD-10-CM | POA: Diagnosis not present

## 2016-09-19 DIAGNOSIS — I10 Essential (primary) hypertension: Secondary | ICD-10-CM | POA: Diagnosis not present

## 2016-09-19 DIAGNOSIS — M797 Fibromyalgia: Secondary | ICD-10-CM | POA: Diagnosis not present

## 2016-09-19 DIAGNOSIS — K219 Gastro-esophageal reflux disease without esophagitis: Secondary | ICD-10-CM | POA: Diagnosis not present

## 2016-09-19 DIAGNOSIS — E782 Mixed hyperlipidemia: Secondary | ICD-10-CM | POA: Diagnosis not present

## 2016-09-19 DIAGNOSIS — Z6841 Body Mass Index (BMI) 40.0 and over, adult: Secondary | ICD-10-CM | POA: Diagnosis not present

## 2016-09-19 DIAGNOSIS — R7301 Impaired fasting glucose: Secondary | ICD-10-CM | POA: Diagnosis not present

## 2016-09-19 DIAGNOSIS — M5136 Other intervertebral disc degeneration, lumbar region: Secondary | ICD-10-CM | POA: Diagnosis not present

## 2016-09-19 DIAGNOSIS — E6609 Other obesity due to excess calories: Secondary | ICD-10-CM | POA: Diagnosis not present

## 2016-10-19 DIAGNOSIS — Z23 Encounter for immunization: Secondary | ICD-10-CM | POA: Diagnosis not present

## 2016-11-15 DIAGNOSIS — M5030 Other cervical disc degeneration, unspecified cervical region: Secondary | ICD-10-CM | POA: Diagnosis not present

## 2016-11-15 DIAGNOSIS — I2699 Other pulmonary embolism without acute cor pulmonale: Secondary | ICD-10-CM | POA: Diagnosis not present

## 2016-11-15 DIAGNOSIS — Z79891 Long term (current) use of opiate analgesic: Secondary | ICD-10-CM | POA: Diagnosis not present

## 2016-11-15 DIAGNOSIS — M5136 Other intervertebral disc degeneration, lumbar region: Secondary | ICD-10-CM | POA: Diagnosis not present

## 2016-11-15 DIAGNOSIS — M47812 Spondylosis without myelopathy or radiculopathy, cervical region: Secondary | ICD-10-CM | POA: Diagnosis not present

## 2016-11-15 DIAGNOSIS — I10 Essential (primary) hypertension: Secondary | ICD-10-CM | POA: Diagnosis not present

## 2016-11-15 DIAGNOSIS — G894 Chronic pain syndrome: Secondary | ICD-10-CM | POA: Diagnosis not present

## 2016-11-15 DIAGNOSIS — E782 Mixed hyperlipidemia: Secondary | ICD-10-CM | POA: Diagnosis not present

## 2016-11-15 DIAGNOSIS — M47816 Spondylosis without myelopathy or radiculopathy, lumbar region: Secondary | ICD-10-CM | POA: Diagnosis not present

## 2016-11-15 DIAGNOSIS — Z86711 Personal history of pulmonary embolism: Secondary | ICD-10-CM | POA: Diagnosis not present

## 2016-11-15 DIAGNOSIS — M797 Fibromyalgia: Secondary | ICD-10-CM | POA: Diagnosis not present

## 2016-11-15 DIAGNOSIS — K210 Gastro-esophageal reflux disease with esophagitis: Secondary | ICD-10-CM | POA: Diagnosis not present

## 2016-11-15 DIAGNOSIS — M545 Low back pain: Secondary | ICD-10-CM | POA: Diagnosis not present

## 2016-11-21 DIAGNOSIS — M5412 Radiculopathy, cervical region: Secondary | ICD-10-CM | POA: Diagnosis not present

## 2016-11-21 DIAGNOSIS — M797 Fibromyalgia: Secondary | ICD-10-CM | POA: Diagnosis not present

## 2016-11-21 DIAGNOSIS — M5033 Other cervical disc degeneration, cervicothoracic region: Secondary | ICD-10-CM | POA: Diagnosis not present

## 2016-11-21 DIAGNOSIS — Z91040 Latex allergy status: Secondary | ICD-10-CM | POA: Diagnosis not present

## 2016-11-21 DIAGNOSIS — I10 Essential (primary) hypertension: Secondary | ICD-10-CM | POA: Diagnosis not present

## 2016-11-21 DIAGNOSIS — Z886 Allergy status to analgesic agent status: Secondary | ICD-10-CM | POA: Diagnosis not present

## 2016-11-21 DIAGNOSIS — M4802 Spinal stenosis, cervical region: Secondary | ICD-10-CM | POA: Diagnosis not present

## 2016-11-21 DIAGNOSIS — R7301 Impaired fasting glucose: Secondary | ICD-10-CM | POA: Diagnosis not present

## 2016-11-21 DIAGNOSIS — Z79899 Other long term (current) drug therapy: Secondary | ICD-10-CM | POA: Diagnosis not present

## 2016-11-21 DIAGNOSIS — E6609 Other obesity due to excess calories: Secondary | ICD-10-CM | POA: Diagnosis not present

## 2016-11-21 DIAGNOSIS — K219 Gastro-esophageal reflux disease without esophagitis: Secondary | ICD-10-CM | POA: Diagnosis not present

## 2016-11-21 DIAGNOSIS — E782 Mixed hyperlipidemia: Secondary | ICD-10-CM | POA: Diagnosis not present

## 2016-11-21 DIAGNOSIS — M5136 Other intervertebral disc degeneration, lumbar region: Secondary | ICD-10-CM | POA: Diagnosis not present

## 2016-11-21 DIAGNOSIS — M5030 Other cervical disc degeneration, unspecified cervical region: Secondary | ICD-10-CM | POA: Diagnosis not present

## 2016-11-23 ENCOUNTER — Ambulatory Visit (INDEPENDENT_AMBULATORY_CARE_PROVIDER_SITE_OTHER): Payer: Medicare HMO | Admitting: Otolaryngology

## 2016-11-28 DIAGNOSIS — H04123 Dry eye syndrome of bilateral lacrimal glands: Secondary | ICD-10-CM | POA: Diagnosis not present

## 2016-11-28 DIAGNOSIS — T1582XA Foreign body in other and multiple parts of external eye, left eye, initial encounter: Secondary | ICD-10-CM | POA: Diagnosis not present

## 2016-11-29 DIAGNOSIS — J309 Allergic rhinitis, unspecified: Secondary | ICD-10-CM | POA: Diagnosis not present

## 2016-11-29 DIAGNOSIS — R05 Cough: Secondary | ICD-10-CM | POA: Diagnosis not present

## 2016-12-06 DIAGNOSIS — R05 Cough: Secondary | ICD-10-CM | POA: Diagnosis not present

## 2016-12-06 DIAGNOSIS — J0100 Acute maxillary sinusitis, unspecified: Secondary | ICD-10-CM | POA: Diagnosis not present

## 2016-12-22 DIAGNOSIS — M545 Low back pain: Secondary | ICD-10-CM | POA: Diagnosis not present

## 2016-12-22 DIAGNOSIS — Z79891 Long term (current) use of opiate analgesic: Secondary | ICD-10-CM | POA: Diagnosis not present

## 2016-12-22 DIAGNOSIS — M797 Fibromyalgia: Secondary | ICD-10-CM | POA: Diagnosis not present

## 2016-12-22 DIAGNOSIS — M5459 Other low back pain: Secondary | ICD-10-CM | POA: Insufficient documentation

## 2016-12-22 DIAGNOSIS — M47812 Spondylosis without myelopathy or radiculopathy, cervical region: Secondary | ICD-10-CM | POA: Insufficient documentation

## 2016-12-22 DIAGNOSIS — M170 Bilateral primary osteoarthritis of knee: Secondary | ICD-10-CM | POA: Diagnosis not present

## 2016-12-22 DIAGNOSIS — G894 Chronic pain syndrome: Secondary | ICD-10-CM | POA: Insufficient documentation

## 2016-12-22 DIAGNOSIS — M47816 Spondylosis without myelopathy or radiculopathy, lumbar region: Secondary | ICD-10-CM | POA: Insufficient documentation

## 2016-12-27 DIAGNOSIS — Z23 Encounter for immunization: Secondary | ICD-10-CM | POA: Diagnosis not present

## 2017-01-02 DIAGNOSIS — F329 Major depressive disorder, single episode, unspecified: Secondary | ICD-10-CM | POA: Diagnosis not present

## 2017-01-02 DIAGNOSIS — M47816 Spondylosis without myelopathy or radiculopathy, lumbar region: Secondary | ICD-10-CM | POA: Diagnosis not present

## 2017-01-02 DIAGNOSIS — G894 Chronic pain syndrome: Secondary | ICD-10-CM | POA: Diagnosis not present

## 2017-01-02 DIAGNOSIS — M47817 Spondylosis without myelopathy or radiculopathy, lumbosacral region: Secondary | ICD-10-CM | POA: Diagnosis not present

## 2017-01-02 DIAGNOSIS — M170 Bilateral primary osteoarthritis of knee: Secondary | ICD-10-CM | POA: Diagnosis not present

## 2017-01-02 DIAGNOSIS — M797 Fibromyalgia: Secondary | ICD-10-CM | POA: Diagnosis not present

## 2017-01-02 DIAGNOSIS — F4542 Pain disorder with related psychological factors: Secondary | ICD-10-CM | POA: Diagnosis not present

## 2017-01-02 DIAGNOSIS — I10 Essential (primary) hypertension: Secondary | ICD-10-CM | POA: Diagnosis not present

## 2017-01-02 DIAGNOSIS — K219 Gastro-esophageal reflux disease without esophagitis: Secondary | ICD-10-CM | POA: Diagnosis not present

## 2017-02-15 DIAGNOSIS — M47812 Spondylosis without myelopathy or radiculopathy, cervical region: Secondary | ICD-10-CM | POA: Diagnosis not present

## 2017-02-15 DIAGNOSIS — M47816 Spondylosis without myelopathy or radiculopathy, lumbar region: Secondary | ICD-10-CM | POA: Diagnosis not present

## 2017-02-15 DIAGNOSIS — M170 Bilateral primary osteoarthritis of knee: Secondary | ICD-10-CM | POA: Diagnosis not present

## 2017-02-15 DIAGNOSIS — M797 Fibromyalgia: Secondary | ICD-10-CM | POA: Diagnosis not present

## 2017-03-01 ENCOUNTER — Ambulatory Visit (INDEPENDENT_AMBULATORY_CARE_PROVIDER_SITE_OTHER): Payer: Medicare HMO | Admitting: Otolaryngology

## 2017-03-01 DIAGNOSIS — J322 Chronic ethmoidal sinusitis: Secondary | ICD-10-CM | POA: Diagnosis not present

## 2017-03-01 DIAGNOSIS — J320 Chronic maxillary sinusitis: Secondary | ICD-10-CM | POA: Diagnosis not present

## 2017-04-13 DIAGNOSIS — K210 Gastro-esophageal reflux disease with esophagitis: Secondary | ICD-10-CM | POA: Diagnosis not present

## 2017-04-13 DIAGNOSIS — R7301 Impaired fasting glucose: Secondary | ICD-10-CM | POA: Diagnosis not present

## 2017-04-13 DIAGNOSIS — Z9189 Other specified personal risk factors, not elsewhere classified: Secondary | ICD-10-CM | POA: Diagnosis not present

## 2017-04-13 DIAGNOSIS — F324 Major depressive disorder, single episode, in partial remission: Secondary | ICD-10-CM | POA: Diagnosis not present

## 2017-04-13 DIAGNOSIS — M797 Fibromyalgia: Secondary | ICD-10-CM | POA: Diagnosis not present

## 2017-04-13 DIAGNOSIS — I10 Essential (primary) hypertension: Secondary | ICD-10-CM | POA: Diagnosis not present

## 2017-04-13 DIAGNOSIS — E782 Mixed hyperlipidemia: Secondary | ICD-10-CM | POA: Diagnosis not present

## 2017-04-18 DIAGNOSIS — M47896 Other spondylosis, lumbar region: Secondary | ICD-10-CM | POA: Diagnosis not present

## 2017-04-18 DIAGNOSIS — M4186 Other forms of scoliosis, lumbar region: Secondary | ICD-10-CM | POA: Diagnosis not present

## 2017-04-18 DIAGNOSIS — M47812 Spondylosis without myelopathy or radiculopathy, cervical region: Secondary | ICD-10-CM | POA: Diagnosis not present

## 2017-04-18 DIAGNOSIS — M47816 Spondylosis without myelopathy or radiculopathy, lumbar region: Secondary | ICD-10-CM | POA: Diagnosis not present

## 2017-04-19 DIAGNOSIS — Z1212 Encounter for screening for malignant neoplasm of rectum: Secondary | ICD-10-CM | POA: Diagnosis not present

## 2017-04-19 DIAGNOSIS — I10 Essential (primary) hypertension: Secondary | ICD-10-CM | POA: Diagnosis not present

## 2017-04-19 DIAGNOSIS — J301 Allergic rhinitis due to pollen: Secondary | ICD-10-CM | POA: Diagnosis not present

## 2017-04-19 DIAGNOSIS — E6609 Other obesity due to excess calories: Secondary | ICD-10-CM | POA: Diagnosis not present

## 2017-04-19 DIAGNOSIS — M797 Fibromyalgia: Secondary | ICD-10-CM | POA: Diagnosis not present

## 2017-04-19 DIAGNOSIS — Z0001 Encounter for general adult medical examination with abnormal findings: Secondary | ICD-10-CM | POA: Diagnosis not present

## 2017-04-19 DIAGNOSIS — E782 Mixed hyperlipidemia: Secondary | ICD-10-CM | POA: Diagnosis not present

## 2017-04-19 DIAGNOSIS — M5136 Other intervertebral disc degeneration, lumbar region: Secondary | ICD-10-CM | POA: Diagnosis not present

## 2017-04-19 DIAGNOSIS — K219 Gastro-esophageal reflux disease without esophagitis: Secondary | ICD-10-CM | POA: Diagnosis not present

## 2017-04-30 DIAGNOSIS — M47816 Spondylosis without myelopathy or radiculopathy, lumbar region: Secondary | ICD-10-CM | POA: Diagnosis not present

## 2017-04-30 DIAGNOSIS — G8929 Other chronic pain: Secondary | ICD-10-CM | POA: Diagnosis not present

## 2017-04-30 DIAGNOSIS — M545 Low back pain: Secondary | ICD-10-CM | POA: Diagnosis not present

## 2017-05-17 DIAGNOSIS — M47816 Spondylosis without myelopathy or radiculopathy, lumbar region: Secondary | ICD-10-CM | POA: Diagnosis not present

## 2017-05-29 DIAGNOSIS — M545 Low back pain: Secondary | ICD-10-CM | POA: Diagnosis not present

## 2017-05-29 DIAGNOSIS — M47816 Spondylosis without myelopathy or radiculopathy, lumbar region: Secondary | ICD-10-CM | POA: Diagnosis not present

## 2017-05-29 DIAGNOSIS — G894 Chronic pain syndrome: Secondary | ICD-10-CM | POA: Diagnosis not present

## 2017-06-26 DIAGNOSIS — Z86718 Personal history of other venous thrombosis and embolism: Secondary | ICD-10-CM | POA: Diagnosis not present

## 2017-06-26 DIAGNOSIS — F329 Major depressive disorder, single episode, unspecified: Secondary | ICD-10-CM | POA: Diagnosis not present

## 2017-06-26 DIAGNOSIS — M797 Fibromyalgia: Secondary | ICD-10-CM | POA: Diagnosis not present

## 2017-06-26 DIAGNOSIS — M47816 Spondylosis without myelopathy or radiculopathy, lumbar region: Secondary | ICD-10-CM | POA: Diagnosis not present

## 2017-06-26 DIAGNOSIS — M1990 Unspecified osteoarthritis, unspecified site: Secondary | ICD-10-CM | POA: Diagnosis not present

## 2017-06-26 DIAGNOSIS — M5416 Radiculopathy, lumbar region: Secondary | ICD-10-CM | POA: Diagnosis not present

## 2017-06-26 DIAGNOSIS — Z7951 Long term (current) use of inhaled steroids: Secondary | ICD-10-CM | POA: Diagnosis not present

## 2017-06-26 DIAGNOSIS — I10 Essential (primary) hypertension: Secondary | ICD-10-CM | POA: Diagnosis not present

## 2017-06-26 DIAGNOSIS — Z79899 Other long term (current) drug therapy: Secondary | ICD-10-CM | POA: Diagnosis not present

## 2017-07-09 DIAGNOSIS — Z0100 Encounter for examination of eyes and vision without abnormal findings: Secondary | ICD-10-CM | POA: Diagnosis not present

## 2017-07-09 DIAGNOSIS — H5210 Myopia, unspecified eye: Secondary | ICD-10-CM | POA: Diagnosis not present

## 2017-07-10 DIAGNOSIS — M7918 Myalgia, other site: Secondary | ICD-10-CM | POA: Diagnosis not present

## 2017-07-10 DIAGNOSIS — M47816 Spondylosis without myelopathy or radiculopathy, lumbar region: Secondary | ICD-10-CM | POA: Diagnosis not present

## 2017-07-10 DIAGNOSIS — G894 Chronic pain syndrome: Secondary | ICD-10-CM | POA: Diagnosis not present

## 2017-07-10 DIAGNOSIS — M797 Fibromyalgia: Secondary | ICD-10-CM | POA: Diagnosis not present

## 2017-07-21 DIAGNOSIS — S00211A Abrasion of right eyelid and periocular area, initial encounter: Secondary | ICD-10-CM | POA: Diagnosis not present

## 2017-08-25 DIAGNOSIS — E782 Mixed hyperlipidemia: Secondary | ICD-10-CM | POA: Diagnosis not present

## 2017-08-25 DIAGNOSIS — K219 Gastro-esophageal reflux disease without esophagitis: Secondary | ICD-10-CM | POA: Diagnosis not present

## 2017-08-25 DIAGNOSIS — R7301 Impaired fasting glucose: Secondary | ICD-10-CM | POA: Diagnosis not present

## 2017-08-25 DIAGNOSIS — I10 Essential (primary) hypertension: Secondary | ICD-10-CM | POA: Diagnosis not present

## 2017-08-25 DIAGNOSIS — M5136 Other intervertebral disc degeneration, lumbar region: Secondary | ICD-10-CM | POA: Diagnosis not present

## 2017-08-25 DIAGNOSIS — Z6841 Body Mass Index (BMI) 40.0 and over, adult: Secondary | ICD-10-CM | POA: Diagnosis not present

## 2017-08-25 DIAGNOSIS — M797 Fibromyalgia: Secondary | ICD-10-CM | POA: Diagnosis not present

## 2017-08-30 ENCOUNTER — Ambulatory Visit (INDEPENDENT_AMBULATORY_CARE_PROVIDER_SITE_OTHER): Payer: Medicare HMO | Admitting: Otolaryngology

## 2017-09-26 DIAGNOSIS — G894 Chronic pain syndrome: Secondary | ICD-10-CM | POA: Diagnosis not present

## 2017-09-26 DIAGNOSIS — M533 Sacrococcygeal disorders, not elsewhere classified: Secondary | ICD-10-CM | POA: Diagnosis not present

## 2017-09-26 DIAGNOSIS — M47816 Spondylosis without myelopathy or radiculopathy, lumbar region: Secondary | ICD-10-CM | POA: Diagnosis not present

## 2017-10-07 ENCOUNTER — Other Ambulatory Visit: Payer: Self-pay

## 2017-10-07 ENCOUNTER — Emergency Department (HOSPITAL_COMMUNITY): Payer: Medicare HMO

## 2017-10-07 ENCOUNTER — Emergency Department (HOSPITAL_COMMUNITY)
Admission: EM | Admit: 2017-10-07 | Discharge: 2017-10-07 | Disposition: A | Discharge status: Home / Self Care | Payer: Medicare HMO | Attending: Emergency Medicine | Admitting: Emergency Medicine

## 2017-10-07 ENCOUNTER — Encounter (HOSPITAL_COMMUNITY): Payer: Self-pay

## 2017-10-07 DIAGNOSIS — M542 Cervicalgia: Secondary | ICD-10-CM | POA: Diagnosis not present

## 2017-10-07 DIAGNOSIS — Z79899 Other long term (current) drug therapy: Secondary | ICD-10-CM | POA: Insufficient documentation

## 2017-10-07 DIAGNOSIS — Y998 Other external cause status: Secondary | ICD-10-CM | POA: Diagnosis not present

## 2017-10-07 DIAGNOSIS — Z86718 Personal history of other venous thrombosis and embolism: Secondary | ICD-10-CM | POA: Insufficient documentation

## 2017-10-07 DIAGNOSIS — S161XXA Strain of muscle, fascia and tendon at neck level, initial encounter: Secondary | ICD-10-CM

## 2017-10-07 DIAGNOSIS — Y92410 Unspecified street and highway as the place of occurrence of the external cause: Secondary | ICD-10-CM | POA: Insufficient documentation

## 2017-10-07 DIAGNOSIS — I10 Essential (primary) hypertension: Secondary | ICD-10-CM | POA: Diagnosis not present

## 2017-10-07 DIAGNOSIS — S0990XA Unspecified injury of head, initial encounter: Secondary | ICD-10-CM | POA: Diagnosis present

## 2017-10-07 DIAGNOSIS — Y939 Activity, unspecified: Secondary | ICD-10-CM | POA: Insufficient documentation

## 2017-10-07 DIAGNOSIS — Z91040 Latex allergy status: Secondary | ICD-10-CM | POA: Insufficient documentation

## 2017-10-07 DIAGNOSIS — R51 Headache: Secondary | ICD-10-CM | POA: Diagnosis not present

## 2017-10-07 MED ORDER — METHOCARBAMOL 500 MG PO TABS: 500.0000 mg | ORAL_TABLET | Freq: Two times a day (BID) | ORAL | 0 refills | Status: DC

## 2017-10-07 MED ORDER — CYCLOBENZAPRINE HCL 10 MG PO TABS: 10.0000 mg | ORAL_TABLET | Freq: Once | ORAL | Status: DC

## 2017-10-07 NOTE — ED Notes (Signed)
Bed: WTR6 Expected date:  Expected time:  Means of arrival:  Comments: 
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
## 2017-10-07 NOTE — Discharge Instructions (Signed)
Do not take the muscle relaxer when driving as it will make you sleepy.
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
## 2017-10-15 ENCOUNTER — Ambulatory Visit (INDEPENDENT_AMBULATORY_CARE_PROVIDER_SITE_OTHER): Payer: Medicare HMO | Admitting: Otolaryngology

## 2017-10-15 DIAGNOSIS — J322 Chronic ethmoidal sinusitis: Secondary | ICD-10-CM

## 2017-10-15 DIAGNOSIS — J310 Chronic rhinitis: Secondary | ICD-10-CM

## 2017-10-15 DIAGNOSIS — J320 Chronic maxillary sinusitis: Secondary | ICD-10-CM

## 2017-10-22 DIAGNOSIS — M461 Sacroiliitis, not elsewhere classified: Secondary | ICD-10-CM | POA: Diagnosis not present

## 2017-10-22 DIAGNOSIS — M47816 Spondylosis without myelopathy or radiculopathy, lumbar region: Secondary | ICD-10-CM | POA: Diagnosis not present

## 2017-11-07 DIAGNOSIS — K219 Gastro-esophageal reflux disease without esophagitis: Secondary | ICD-10-CM | POA: Diagnosis not present

## 2017-11-07 DIAGNOSIS — M5136 Other intervertebral disc degeneration, lumbar region: Secondary | ICD-10-CM | POA: Diagnosis not present

## 2017-11-07 DIAGNOSIS — F331 Major depressive disorder, recurrent, moderate: Secondary | ICD-10-CM | POA: Diagnosis not present

## 2017-11-07 DIAGNOSIS — I10 Essential (primary) hypertension: Secondary | ICD-10-CM | POA: Diagnosis not present

## 2017-11-19 ENCOUNTER — Encounter: Payer: Self-pay | Admitting: Neurology

## 2017-11-19 ENCOUNTER — Encounter

## 2017-11-19 ENCOUNTER — Ambulatory Visit (INDEPENDENT_AMBULATORY_CARE_PROVIDER_SITE_OTHER): Payer: Medicare HMO | Admitting: Neurology

## 2017-11-19 VITALS — BP 141/82 | HR 71 | Ht 64.0 in | Wt 224.0 lb

## 2017-11-19 DIAGNOSIS — R51 Headache: Secondary | ICD-10-CM

## 2017-11-19 DIAGNOSIS — F0781 Postconcussional syndrome: Secondary | ICD-10-CM | POA: Diagnosis not present

## 2017-11-19 DIAGNOSIS — S134XXA Sprain of ligaments of cervical spine, initial encounter: Secondary | ICD-10-CM | POA: Diagnosis not present

## 2017-11-19 MED ORDER — AMITRIPTYLINE HCL 10 MG PO TABS
10.0000 mg | ORAL_TABLET | Freq: Every day | ORAL | 6 refills | Status: DC
Start: 2017-11-19 — End: 2017-12-14

## 2017-11-19 NOTE — Addendum Note (Signed)
Addended by: Sarina Ill B on: 11/19/2017 09:35 AM   Modules accepted: Orders, Level of Service
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
## 2017-11-19 NOTE — Patient Instructions (Addendum)
Post-Concussion Syndrome Post-concussion syndrome is the symptoms that can occur after a head injury. These symptoms can last from weeks to months. Follow these instructions at home:  Take medicines only as told by your doctor.  Do not take aspirin.  Sleep with your head raised to help with headaches.  Avoid activities that can cause another head injury. ? Do not play contact sports like football, hockey, soccer, or basketball. ? Do not do other risky activities like downhill skiing, martial arts, or horseback riding until your doctor says it is okay.  Keep all follow-up visits as told by your doctor. This is important. Contact a doctor if:  You have a harder time: ? Paying attention. ? Focusing. ? Remembering. ? Learning new information. ? Dealing with stress.  You need more time to complete tasks.  You are easily bothered (irritable).  You have more symptoms. Get help if you have any of these symptoms for more than two weeks after your injury:  Long-lasting (chronic) headaches.  Dizziness.  Trouble balancing.  Feeling sick to your stomach (nauseous).  Trouble with your vision.  Noise or light bothers you more.  Depression.  Mood swings.  Feeling worried (anxious).  Easily bothered.  Memory problems.  Trouble concentrating or paying attention.  Sleep problems.  Feeling tired all of the time.  Get help right away if:  You feel confused.  You feel very sleepy.  You are hard to wake up.  You feel sick to your stomach.  You keep throwing up (vomiting).  You feel like you are moving when you are not (vertigo).  Your eyes move back and forth very quickly.  You start shaking (convulsing) or pass out (faint).  You have very bad headaches that do not get better with medicine.  You cannot use your arms or legs like normal.  One of the black centers of your eyes (pupils) is bigger than the other.  You have clear or bloody fluid coming from your  nose or ears.  Your problems get worse, not better. This information is not intended to replace advice given to you by your health care provider. Make sure you discuss any questions you have with your health care provider. Document Released: 04/06/2004 Document Revised: 08/05/2015 Document Reviewed: 06/04/2013 Elsevier Interactive Patient Education  2018 [employer].  Amitriptyline tablets What is this medicine? AMITRIPTYLINE (a mee TRIP ti leen) is used to treat depression, headaches, fibromyalgia, post-concussive syndrome, migraines. This medicine may be used for other purposes; ask your health care provider or pharmacist if you have questions. COMMON BRAND NAME(S): Elavil, Vanatrip What should I tell my health care provider before I take this medicine? They need to know if you have any of these conditions: -an alcohol problem -asthma, difficulty breathing -bipolar disorder or schizophrenia -difficulty passing urine, prostate trouble -glaucoma -heart disease or previous heart attack -liver disease -over active thyroid -seizures -thoughts or plans of suicide, a previous suicide attempt, or family history of suicide attempt -an unusual or allergic reaction to amitriptyline, other medicines, foods, dyes, or preservatives -pregnant or trying to get pregnant -breast-feeding How should I use this medicine? Take this medicine by mouth with a drink of water. Follow the directions on the prescription label. You can take the tablets with or without food. Take your medicine at regular intervals. Do not take it more often than directed. Do not stop taking this medicine suddenly except upon the advice of your doctor. Stopping this medicine too quickly may cause serious side  effects or your condition may worsen. A special MedGuide will be given to you by the pharmacist with each prescription and refill. Be sure to read this information carefully each time. Talk to your pediatrician regarding the  use of this medicine in children. Special care may be needed. Overdosage: If you think you have taken too much of this medicine contact a poison control center or emergency room at once. NOTE: This medicine is only for you. Do not share this medicine with others. What if I miss a dose? If you miss a dose, take it as soon as you can. If it is almost time for your next dose, take only that dose. Do not take double or extra doses. What may interact with this medicine? Do not take this medicine with any of the following medications: -arsenic trioxide -certain medicines used to regulate abnormal heartbeat or to treat other heart conditions -cisapride -droperidol -halofantrine -linezolid -MAOIs like Carbex, Eldepryl, Marplan, Nardil, and Parnate -methylene blue -other medicines for mental depression -phenothiazines like perphenazine, thioridazine and chlorpromazine -pimozide -probucol -procarbazine -sparfloxacin -St. John's Wort -ziprasidone This medicine may also interact with the following medications: -atropine and related drugs like hyoscyamine, scopolamine, tolterodine and others -barbiturate medicines for inducing sleep or treating seizures, like phenobarbital -cimetidine -disulfiram -ethchlorvynol -thyroid hormones such as levothyroxine This list may not describe all possible interactions. Give your health care provider a list of all the medicines, herbs, non-prescription drugs, or dietary supplements you use. Also tell them if you smoke, drink alcohol, or use illegal drugs. Some items may interact with your medicine. What should I watch for while using this medicine? Tell your doctor if your symptoms do not get better or if they get worse. Visit your doctor or health care professional for regular checks on your progress. Because it may take several weeks to see the full effects of this medicine, it is important to continue your treatment as prescribed by your doctor. Patients and  their families should watch out for new or worsening thoughts of suicide or depression. Also watch out for sudden changes in feelings such as feeling anxious, agitated, panicky, irritable, hostile, aggressive, impulsive, severely restless, overly excited and hyperactive, or not being able to sleep. If this happens, especially at the beginning of treatment or after a change in dose, call your health care professional. Dennis Bast may get drowsy or dizzy. Do not drive, use machinery, or do anything that needs mental alertness until you know how this medicine affects you. Do not stand or sit up quickly, especially if you are an older patient. This reduces the risk of dizzy or fainting spells. Alcohol may interfere with the effect of this medicine. Avoid alcoholic drinks. Do not treat yourself for coughs, colds, or allergies without asking your doctor or health care professional for advice. Some ingredients can increase possible side effects. Your mouth may get dry. Chewing sugarless gum or sucking hard candy, and drinking plenty of water will help. Contact your doctor if the problem does not go away or is severe. This medicine may cause dry eyes and blurred vision. If you wear contact lenses you may feel some discomfort. Lubricating drops may help. See your eye doctor if the problem does not go away or is severe. This medicine can cause constipation. Try to have a bowel movement at least every 2 to 3 days. If you do not have a bowel movement for 3 days, call your doctor or health care professional. This medicine can make you more sensitive  to the sun. Keep out of the sun. If you cannot avoid being in the sun, wear protective clothing and use sunscreen. Do not use sun lamps or tanning beds/booths. What side effects may I notice from receiving this medicine? Side effects that you should report to your doctor or health care professional as soon as possible: -allergic reactions like skin rash, itching or hives, swelling  of the face, lips, or tongue -anxious -breathing problems -changes in vision -confusion -elevated mood, decreased need for sleep, racing thoughts, impulsive behavior -eye pain -fast, irregular heartbeat -feeling faint or lightheaded, falls -feeling agitated, angry, or irritable -fever with increased sweating -hallucination, loss of contact with reality -seizures -stiff muscles -suicidal thoughts or other mood changes -tingling, pain, or numbness in the feet or hands -trouble passing urine or change in the amount of urine -trouble sleeping -unusually weak or tired -vomiting -yellowing of the eyes or skin Side effects that usually do not require medical attention (report to your doctor or health care professional if they continue or are bothersome): -change in sex drive or performance -change in appetite or weight -constipation -dizziness -dry mouth -nausea -tired -tremors -upset stomach This list may not describe all possible side effects. Call your doctor for medical advice about side effects. You may report side effects to FDA at 1-800-FDA-1088. Where should I keep my medicine? Keep out of the reach of children. Store at room temperature between 20 and 25 degrees C (68 and 77 degrees F). Throw away any unused medicine after the expiration date. NOTE: This sheet is a summary. It may not cover all possible information. If you have questions about this medicine, talk to your doctor, pharmacist, or health care provider.  2018 Elsevier/Gold Standard (2015-07-30 12:14:15)  Amitriptyline tablets What is this medicine? AMITRIPTYLINE (a mee TRIP ti leen) is used to treat depression. This medicine may be used for other purposes; ask your health care provider or pharmacist if you have questions. COMMON BRAND NAME(S): Elavil, Vanatrip What should I tell my health care provider before I take this medicine? They need to know if you have any of these conditions: -an alcohol  problem -asthma, difficulty breathing -bipolar disorder or schizophrenia -difficulty passing urine, prostate trouble -glaucoma -heart disease or previous heart attack -liver disease -over active thyroid -seizures -thoughts or plans of suicide, a previous suicide attempt, or family history of suicide attempt -an unusual or allergic reaction to amitriptyline, other medicines, foods, dyes, or preservatives -pregnant or trying to get pregnant -breast-feeding How should I use this medicine? Take this medicine by mouth with a drink of water. Follow the directions on the prescription label. You can take the tablets with or without food. Take your medicine at regular intervals. Do not take it more often than directed. Do not stop taking this medicine suddenly except upon the advice of your doctor. Stopping this medicine too quickly may cause serious side effects or your condition may worsen. A special MedGuide will be given to you by the pharmacist with each prescription and refill. Be sure to read this information carefully each time. Talk to your pediatrician regarding the use of this medicine in children. Special care may be needed. Overdosage: If you think you have taken too much of this medicine contact a poison control center or emergency room at once. NOTE: This medicine is only for you. Do not share this medicine with others. What if I miss a dose? If you miss a dose, take it as soon as you can. If  it is almost time for your next dose, take only that dose. Do not take double or extra doses. What may interact with this medicine? Do not take this medicine with any of the following medications: -arsenic trioxide -certain medicines used to regulate abnormal heartbeat or to treat other heart conditions -cisapride -droperidol -halofantrine -linezolid -MAOIs like Carbex, Eldepryl, Marplan, Nardil, and Parnate -methylene blue -other medicines for mental depression -phenothiazines like  perphenazine, thioridazine and chlorpromazine -pimozide -probucol -procarbazine -sparfloxacin -St. John's Wort -ziprasidone This medicine may also interact with the following medications: -atropine and related drugs like hyoscyamine, scopolamine, tolterodine and others -barbiturate medicines for inducing sleep or treating seizures, like phenobarbital -cimetidine -disulfiram -ethchlorvynol -thyroid hormones such as levothyroxine This list may not describe all possible interactions. Give your health care provider a list of all the medicines, herbs, non-prescription drugs, or dietary supplements you use. Also tell them if you smoke, drink alcohol, or use illegal drugs. Some items may interact with your medicine. What should I watch for while using this medicine? Tell your doctor if your symptoms do not get better or if they get worse. Visit your doctor or health care professional for regular checks on your progress. Because it may take several weeks to see the full effects of this medicine, it is important to continue your treatment as prescribed by your doctor. Patients and their families should watch out for new or worsening thoughts of suicide or depression. Also watch out for sudden changes in feelings such as feeling anxious, agitated, panicky, irritable, hostile, aggressive, impulsive, severely restless, overly excited and hyperactive, or not being able to sleep. If this happens, especially at the beginning of treatment or after a change in dose, call your health care professional. Dennis Bast may get drowsy or dizzy. Do not drive, use machinery, or do anything that needs mental alertness until you know how this medicine affects you. Do not stand or sit up quickly, especially if you are an older patient. This reduces the risk of dizzy or fainting spells. Alcohol may interfere with the effect of this medicine. Avoid alcoholic drinks. Do not treat yourself for coughs, colds, or allergies without asking  your doctor or health care professional for advice. Some ingredients can increase possible side effects. Your mouth may get dry. Chewing sugarless gum or sucking hard candy, and drinking plenty of water will help. Contact your doctor if the problem does not go away or is severe. This medicine may cause dry eyes and blurred vision. If you wear contact lenses you may feel some discomfort. Lubricating drops may help. See your eye doctor if the problem does not go away or is severe. This medicine can cause constipation. Try to have a bowel movement at least every 2 to 3 days. If you do not have a bowel movement for 3 days, call your doctor or health care professional. This medicine can make you more sensitive to the sun. Keep out of the sun. If you cannot avoid being in the sun, wear protective clothing and use sunscreen. Do not use sun lamps or tanning beds/booths. What side effects may I notice from receiving this medicine? Side effects that you should report to your doctor or health care professional as soon as possible: -allergic reactions like skin rash, itching or hives, swelling of the face, lips, or tongue -anxious -breathing problems -changes in vision -confusion -elevated mood, decreased need for sleep, racing thoughts, impulsive behavior -eye pain -fast, irregular heartbeat -feeling faint or lightheaded, falls -feeling agitated, angry, or  irritable -fever with increased sweating -hallucination, loss of contact with reality -seizures -stiff muscles -suicidal thoughts or other mood changes -tingling, pain, or numbness in the feet or hands -trouble passing urine or change in the amount of urine -trouble sleeping -unusually weak or tired -vomiting -yellowing of the eyes or skin Side effects that usually do not require medical attention (report to your doctor or health care professional if they continue or are bothersome): -change in sex drive or performance -change in appetite or  weight -constipation -dizziness -dry mouth -nausea -tired -tremors -upset stomach This list may not describe all possible side effects. Call your doctor for medical advice about side effects. You may report side effects to FDA at 1-800-FDA-1088. Where should I keep my medicine? Keep out of the reach of children. Store at room temperature between 20 and 25 degrees C (68 and 77 degrees F). Throw away any unused medicine after the expiration date. NOTE: This sheet is a summary. It may not cover all possible information. If you have questions about this medicine, talk to your doctor, pharmacist, or health care provider.  2018 Elsevier/Gold Standard (2015-07-30 12:14:15)

## 2017-11-19 NOTE — Progress Notes (Signed)
Nerve block w/o steroid: Pt signed consent  0.5% Bupivocaine 4.5 mL LOT: [id number] EXP: 11/2020  2% Lidocaine 4.5 mL LOT: [id number] EXP: 04/14/2019

## 2017-11-22 DIAGNOSIS — M47816 Spondylosis without myelopathy or radiculopathy, lumbar region: Secondary | ICD-10-CM | POA: Diagnosis not present

## 2017-11-22 DIAGNOSIS — M7918 Myalgia, other site: Secondary | ICD-10-CM | POA: Diagnosis not present

## 2017-11-22 DIAGNOSIS — Z79891 Long term (current) use of opiate analgesic: Secondary | ICD-10-CM | POA: Diagnosis not present

## 2017-11-23 DIAGNOSIS — E039 Hypothyroidism, unspecified: Secondary | ICD-10-CM | POA: Diagnosis not present

## 2017-11-23 DIAGNOSIS — E782 Mixed hyperlipidemia: Secondary | ICD-10-CM | POA: Diagnosis not present

## 2017-11-23 DIAGNOSIS — Z9189 Other specified personal risk factors, not elsewhere classified: Secondary | ICD-10-CM | POA: Diagnosis not present

## 2017-11-23 DIAGNOSIS — Z79899 Other long term (current) drug therapy: Secondary | ICD-10-CM | POA: Diagnosis not present

## 2017-11-23 DIAGNOSIS — K210 Gastro-esophageal reflux disease with esophagitis: Secondary | ICD-10-CM | POA: Diagnosis not present

## 2017-11-23 DIAGNOSIS — R7301 Impaired fasting glucose: Secondary | ICD-10-CM | POA: Diagnosis not present

## 2017-11-23 DIAGNOSIS — I10 Essential (primary) hypertension: Secondary | ICD-10-CM | POA: Diagnosis not present

## 2017-11-24 DIAGNOSIS — Z7951 Long term (current) use of inhaled steroids: Secondary | ICD-10-CM | POA: Diagnosis not present

## 2017-11-24 DIAGNOSIS — R1031 Right lower quadrant pain: Secondary | ICD-10-CM | POA: Diagnosis not present

## 2017-11-24 DIAGNOSIS — R109 Unspecified abdominal pain: Secondary | ICD-10-CM | POA: Diagnosis not present

## 2017-11-24 DIAGNOSIS — Z7901 Long term (current) use of anticoagulants: Secondary | ICD-10-CM | POA: Diagnosis not present

## 2017-11-24 DIAGNOSIS — N760 Acute vaginitis: Secondary | ICD-10-CM | POA: Diagnosis not present

## 2017-11-24 DIAGNOSIS — B9689 Other specified bacterial agents as the cause of diseases classified elsewhere: Secondary | ICD-10-CM | POA: Diagnosis not present

## 2017-11-24 DIAGNOSIS — M1990 Unspecified osteoarthritis, unspecified site: Secondary | ICD-10-CM | POA: Diagnosis not present

## 2017-11-24 DIAGNOSIS — M62830 Muscle spasm of back: Secondary | ICD-10-CM | POA: Diagnosis not present

## 2017-11-24 DIAGNOSIS — M5136 Other intervertebral disc degeneration, lumbar region: Secondary | ICD-10-CM | POA: Diagnosis not present

## 2017-11-24 DIAGNOSIS — Z79899 Other long term (current) drug therapy: Secondary | ICD-10-CM | POA: Diagnosis not present

## 2017-11-27 ENCOUNTER — Encounter: Payer: Self-pay | Admitting: Physical Therapy

## 2017-11-27 ENCOUNTER — Ambulatory Visit: Payer: Medicare HMO | Attending: Neurology | Admitting: Physical Therapy

## 2017-11-27 ENCOUNTER — Other Ambulatory Visit: Payer: Self-pay

## 2017-11-27 DIAGNOSIS — R293 Abnormal posture: Secondary | ICD-10-CM | POA: Insufficient documentation

## 2017-11-27 DIAGNOSIS — M542 Cervicalgia: Secondary | ICD-10-CM | POA: Insufficient documentation
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
## 2017-11-28 DIAGNOSIS — I10 Essential (primary) hypertension: Secondary | ICD-10-CM | POA: Diagnosis not present

## 2017-11-28 DIAGNOSIS — Z23 Encounter for immunization: Secondary | ICD-10-CM | POA: Diagnosis not present

## 2017-11-28 DIAGNOSIS — F1190 Opioid use, unspecified, uncomplicated: Secondary | ICD-10-CM | POA: Diagnosis not present

## 2017-11-28 DIAGNOSIS — Z1331 Encounter for screening for depression: Secondary | ICD-10-CM | POA: Diagnosis not present

## 2017-11-28 DIAGNOSIS — G252 Other specified forms of tremor: Secondary | ICD-10-CM | POA: Diagnosis not present

## 2017-11-28 DIAGNOSIS — E782 Mixed hyperlipidemia: Secondary | ICD-10-CM | POA: Diagnosis not present

## 2017-11-28 DIAGNOSIS — Z1389 Encounter for screening for other disorder: Secondary | ICD-10-CM | POA: Diagnosis not present

## 2017-11-28 DIAGNOSIS — R7301 Impaired fasting glucose: Secondary | ICD-10-CM | POA: Diagnosis not present

## 2017-11-29 ENCOUNTER — Encounter: Payer: Medicare HMO | Admitting: *Deleted

## 2017-11-29 DIAGNOSIS — M5137 Other intervertebral disc degeneration, lumbosacral region: Secondary | ICD-10-CM | POA: Diagnosis not present

## 2017-11-29 DIAGNOSIS — M5136 Other intervertebral disc degeneration, lumbar region: Secondary | ICD-10-CM | POA: Diagnosis not present

## 2017-11-29 DIAGNOSIS — M5126 Other intervertebral disc displacement, lumbar region: Secondary | ICD-10-CM | POA: Diagnosis not present

## 2017-11-29 DIAGNOSIS — M47896 Other spondylosis, lumbar region: Secondary | ICD-10-CM | POA: Diagnosis not present

## 2017-11-29 DIAGNOSIS — M47816 Spondylosis without myelopathy or radiculopathy, lumbar region: Secondary | ICD-10-CM | POA: Diagnosis not present

## 2017-11-30 ENCOUNTER — Ambulatory Visit: Payer: Medicare HMO | Admitting: *Deleted

## 2017-11-30 DIAGNOSIS — M542 Cervicalgia: Secondary | ICD-10-CM | POA: Diagnosis not present

## 2017-11-30 DIAGNOSIS — R293 Abnormal posture: Secondary | ICD-10-CM | POA: Diagnosis not present
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
## 2017-12-04 ENCOUNTER — Ambulatory Visit: Payer: Medicare HMO | Admitting: *Deleted

## 2017-12-04 DIAGNOSIS — R293 Abnormal posture: Secondary | ICD-10-CM

## 2017-12-04 DIAGNOSIS — M542 Cervicalgia: Secondary | ICD-10-CM
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
## 2017-12-06 ENCOUNTER — Ambulatory Visit: Payer: Medicare HMO | Admitting: Physical Therapy

## 2017-12-06 ENCOUNTER — Encounter: Payer: Self-pay | Admitting: Physical Therapy

## 2017-12-06 DIAGNOSIS — R293 Abnormal posture: Secondary | ICD-10-CM | POA: Diagnosis not present

## 2017-12-06 DIAGNOSIS — M542 Cervicalgia: Secondary | ICD-10-CM | POA: Diagnosis not present
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
## 2017-12-12 ENCOUNTER — Ambulatory Visit: Payer: Medicare HMO | Attending: Neurology | Admitting: Physical Therapy

## 2017-12-12 ENCOUNTER — Encounter: Payer: Self-pay | Admitting: Physical Therapy

## 2017-12-12 DIAGNOSIS — R293 Abnormal posture: Secondary | ICD-10-CM | POA: Diagnosis not present

## 2017-12-12 DIAGNOSIS — M542 Cervicalgia: Secondary | ICD-10-CM | POA: Diagnosis not present
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
## 2017-12-13 ENCOUNTER — Encounter: Payer: Self-pay | Admitting: Physical Therapy

## 2017-12-13 ENCOUNTER — Ambulatory Visit: Payer: Medicare HMO | Admitting: Physical Therapy

## 2017-12-13 DIAGNOSIS — M542 Cervicalgia: Secondary | ICD-10-CM

## 2017-12-13 DIAGNOSIS — R293 Abnormal posture: Secondary | ICD-10-CM | POA: Diagnosis not present
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
## 2017-12-14 ENCOUNTER — Other Ambulatory Visit: Payer: Self-pay | Admitting: Neurology

## 2017-12-14 DIAGNOSIS — F0781 Postconcussional syndrome: Secondary | ICD-10-CM

## 2017-12-17 ENCOUNTER — Ambulatory Visit: Payer: Medicare HMO | Admitting: Physical Therapy

## 2017-12-17 ENCOUNTER — Encounter: Payer: Self-pay | Admitting: Physical Therapy

## 2017-12-17 DIAGNOSIS — R293 Abnormal posture: Secondary | ICD-10-CM | POA: Diagnosis not present

## 2017-12-17 DIAGNOSIS — M542 Cervicalgia: Secondary | ICD-10-CM
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
## 2017-12-18 DIAGNOSIS — Z79899 Other long term (current) drug therapy: Secondary | ICD-10-CM | POA: Diagnosis not present

## 2017-12-18 DIAGNOSIS — D720 Genetic anomalies of leukocytes: Secondary | ICD-10-CM | POA: Diagnosis not present

## 2017-12-18 DIAGNOSIS — Z7901 Long term (current) use of anticoagulants: Secondary | ICD-10-CM | POA: Diagnosis not present

## 2017-12-18 DIAGNOSIS — I82409 Acute embolism and thrombosis of unspecified deep veins of unspecified lower extremity: Secondary | ICD-10-CM | POA: Diagnosis not present

## 2017-12-18 DIAGNOSIS — M797 Fibromyalgia: Secondary | ICD-10-CM | POA: Diagnosis not present

## 2017-12-18 DIAGNOSIS — Z86711 Personal history of pulmonary embolism: Secondary | ICD-10-CM | POA: Diagnosis not present

## 2017-12-18 DIAGNOSIS — M5136 Other intervertebral disc degeneration, lumbar region: Secondary | ICD-10-CM | POA: Diagnosis not present

## 2017-12-18 DIAGNOSIS — K219 Gastro-esophageal reflux disease without esophagitis: Secondary | ICD-10-CM | POA: Diagnosis not present

## 2017-12-18 DIAGNOSIS — I10 Essential (primary) hypertension: Secondary | ICD-10-CM | POA: Diagnosis not present

## 2017-12-19 ENCOUNTER — Encounter: Payer: Self-pay | Admitting: Physical Therapy

## 2017-12-19 ENCOUNTER — Ambulatory Visit: Payer: Medicare HMO | Admitting: Physical Therapy

## 2017-12-19 DIAGNOSIS — M542 Cervicalgia: Secondary | ICD-10-CM | POA: Diagnosis not present

## 2017-12-19 DIAGNOSIS — R293 Abnormal posture: Secondary | ICD-10-CM | POA: Diagnosis not present
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
## 2017-12-26 ENCOUNTER — Encounter: Payer: Medicare HMO | Admitting: Physical Therapy

## 2017-12-27 DIAGNOSIS — J069 Acute upper respiratory infection, unspecified: Secondary | ICD-10-CM | POA: Diagnosis not present

## 2017-12-27 DIAGNOSIS — Z6836 Body mass index (BMI) 36.0-36.9, adult: Secondary | ICD-10-CM | POA: Diagnosis not present

## 2017-12-27 DIAGNOSIS — R946 Abnormal results of thyroid function studies: Secondary | ICD-10-CM | POA: Diagnosis not present

## 2017-12-28 ENCOUNTER — Encounter: Payer: Medicare HMO | Admitting: *Deleted

## 2018-01-01 ENCOUNTER — Ambulatory Visit: Payer: Medicare HMO | Admitting: Physical Therapy

## 2018-01-01 ENCOUNTER — Encounter: Payer: Self-pay | Admitting: Physical Therapy

## 2018-01-01 DIAGNOSIS — R293 Abnormal posture: Secondary | ICD-10-CM | POA: Diagnosis not present

## 2018-01-01 DIAGNOSIS — M542 Cervicalgia: Secondary | ICD-10-CM | POA: Diagnosis not present
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
## 2018-01-03 ENCOUNTER — Encounter: Payer: Self-pay | Admitting: Physical Therapy

## 2018-01-03 ENCOUNTER — Ambulatory Visit: Payer: Medicare HMO | Admitting: Physical Therapy

## 2018-01-03 DIAGNOSIS — M542 Cervicalgia: Secondary | ICD-10-CM

## 2018-01-03 DIAGNOSIS — R293 Abnormal posture: Secondary | ICD-10-CM | POA: Diagnosis not present
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
## 2018-01-08 ENCOUNTER — Encounter: Payer: Medicare HMO | Admitting: Physical Therapy

## 2018-01-10 ENCOUNTER — Ambulatory Visit: Payer: Medicare HMO | Admitting: Physical Therapy

## 2018-01-10 DIAGNOSIS — M542 Cervicalgia: Secondary | ICD-10-CM | POA: Diagnosis not present

## 2018-01-10 DIAGNOSIS — R293 Abnormal posture: Secondary | ICD-10-CM
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
## 2018-01-11 ENCOUNTER — Ambulatory Visit: Payer: Medicare HMO | Attending: Neurology | Admitting: *Deleted

## 2018-01-11 DIAGNOSIS — M542 Cervicalgia: Secondary | ICD-10-CM | POA: Diagnosis not present

## 2018-01-11 DIAGNOSIS — R293 Abnormal posture: Secondary | ICD-10-CM | POA: Insufficient documentation
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
## 2018-01-22 DIAGNOSIS — J0100 Acute maxillary sinusitis, unspecified: Secondary | ICD-10-CM | POA: Diagnosis not present

## 2018-01-22 DIAGNOSIS — R05 Cough: Secondary | ICD-10-CM | POA: Diagnosis not present

## 2018-01-25 DIAGNOSIS — E8941 Symptomatic postprocedural ovarian failure: Secondary | ICD-10-CM | POA: Diagnosis not present

## 2018-01-25 DIAGNOSIS — Z6839 Body mass index (BMI) 39.0-39.9, adult: Secondary | ICD-10-CM | POA: Diagnosis not present

## 2018-01-25 DIAGNOSIS — Z01419 Encounter for gynecological examination (general) (routine) without abnormal findings: Secondary | ICD-10-CM | POA: Diagnosis not present

## 2018-01-28 ENCOUNTER — Telehealth: Payer: Self-pay | Admitting: Neurology

## 2018-01-28 NOTE — Telephone Encounter (Signed)
Spoke with patient. She stated that she is finished with PT and her headache is about 50%, not as often. She said today is not a good day but overall she is better. RN advised per Dr. Jaynee Eagles, if her headache is better, she wouldn't do anything else right now and just give it time as it can take 3-6 months. The patient was agreeable to the plan and will call if she needs [place]. She verbalized appreciation.

## 2018-01-28 NOTE — Telephone Encounter (Signed)
Pt states she has completed her physical therapy and was advised to call to see what if anything else Dr Jaynee Eagles would have her to do now, please call.

## 2018-02-22 DIAGNOSIS — M47816 Spondylosis without myelopathy or radiculopathy, lumbar region: Secondary | ICD-10-CM | POA: Diagnosis not present

## 2018-02-22 DIAGNOSIS — M5126 Other intervertebral disc displacement, lumbar region: Secondary | ICD-10-CM | POA: Diagnosis not present

## 2018-02-22 DIAGNOSIS — M5416 Radiculopathy, lumbar region: Secondary | ICD-10-CM | POA: Diagnosis not present

## 2018-02-22 DIAGNOSIS — G894 Chronic pain syndrome: Secondary | ICD-10-CM | POA: Diagnosis not present

## 2018-03-11 DIAGNOSIS — K219 Gastro-esophageal reflux disease without esophagitis: Secondary | ICD-10-CM | POA: Diagnosis not present

## 2018-03-11 DIAGNOSIS — I10 Essential (primary) hypertension: Secondary | ICD-10-CM | POA: Diagnosis not present

## 2018-03-11 DIAGNOSIS — F331 Major depressive disorder, recurrent, moderate: Secondary | ICD-10-CM | POA: Diagnosis not present

## 2018-03-11 DIAGNOSIS — M5136 Other intervertebral disc degeneration, lumbar region: Secondary | ICD-10-CM | POA: Diagnosis not present

## 2018-03-29 DIAGNOSIS — K210 Gastro-esophageal reflux disease with esophagitis: Secondary | ICD-10-CM | POA: Diagnosis not present

## 2018-03-29 DIAGNOSIS — I10 Essential (primary) hypertension: Secondary | ICD-10-CM | POA: Diagnosis not present

## 2018-03-29 DIAGNOSIS — R7301 Impaired fasting glucose: Secondary | ICD-10-CM | POA: Diagnosis not present

## 2018-03-29 DIAGNOSIS — R946 Abnormal results of thyroid function studies: Secondary | ICD-10-CM | POA: Diagnosis not present

## 2018-03-29 DIAGNOSIS — E782 Mixed hyperlipidemia: Secondary | ICD-10-CM | POA: Diagnosis not present

## 2018-04-01 DIAGNOSIS — I10 Essential (primary) hypertension: Secondary | ICD-10-CM | POA: Diagnosis not present

## 2018-04-01 DIAGNOSIS — M48061 Spinal stenosis, lumbar region without neurogenic claudication: Secondary | ICD-10-CM | POA: Diagnosis not present

## 2018-04-01 DIAGNOSIS — Z0001 Encounter for general adult medical examination with abnormal findings: Secondary | ICD-10-CM | POA: Diagnosis not present

## 2018-04-01 DIAGNOSIS — M5136 Other intervertebral disc degeneration, lumbar region: Secondary | ICD-10-CM | POA: Diagnosis not present

## 2018-04-01 DIAGNOSIS — Z96653 Presence of artificial knee joint, bilateral: Secondary | ICD-10-CM | POA: Diagnosis not present

## 2018-04-01 DIAGNOSIS — Z7951 Long term (current) use of inhaled steroids: Secondary | ICD-10-CM | POA: Diagnosis not present

## 2018-04-01 DIAGNOSIS — K219 Gastro-esophageal reflux disease without esophagitis: Secondary | ICD-10-CM | POA: Diagnosis not present

## 2018-04-01 DIAGNOSIS — Z1212 Encounter for screening for malignant neoplasm of rectum: Secondary | ICD-10-CM | POA: Diagnosis not present

## 2018-04-01 DIAGNOSIS — Z7901 Long term (current) use of anticoagulants: Secondary | ICD-10-CM | POA: Diagnosis not present

## 2018-04-01 DIAGNOSIS — M1990 Unspecified osteoarthritis, unspecified site: Secondary | ICD-10-CM | POA: Diagnosis not present

## 2018-04-01 DIAGNOSIS — M797 Fibromyalgia: Secondary | ICD-10-CM | POA: Diagnosis not present

## 2018-04-01 DIAGNOSIS — Z23 Encounter for immunization: Secondary | ICD-10-CM | POA: Diagnosis not present

## 2018-04-11 DIAGNOSIS — E782 Mixed hyperlipidemia: Secondary | ICD-10-CM | POA: Diagnosis not present

## 2018-04-11 DIAGNOSIS — I10 Essential (primary) hypertension: Secondary | ICD-10-CM | POA: Diagnosis not present

## 2018-04-22 DIAGNOSIS — Z1231 Encounter for screening mammogram for malignant neoplasm of breast: Secondary | ICD-10-CM | POA: Diagnosis not present

## 2018-04-30 DIAGNOSIS — M50323 Other cervical disc degeneration at C6-C7 level: Secondary | ICD-10-CM | POA: Diagnosis not present

## 2018-04-30 DIAGNOSIS — M47816 Spondylosis without myelopathy or radiculopathy, lumbar region: Secondary | ICD-10-CM | POA: Diagnosis not present

## 2018-04-30 DIAGNOSIS — M47812 Spondylosis without myelopathy or radiculopathy, cervical region: Secondary | ICD-10-CM | POA: Diagnosis not present

## 2018-04-30 DIAGNOSIS — M5030 Other cervical disc degeneration, unspecified cervical region: Secondary | ICD-10-CM | POA: Diagnosis not present

## 2018-04-30 DIAGNOSIS — G894 Chronic pain syndrome: Secondary | ICD-10-CM | POA: Diagnosis not present

## 2018-04-30 DIAGNOSIS — M48061 Spinal stenosis, lumbar region without neurogenic claudication: Secondary | ICD-10-CM | POA: Diagnosis not present

## 2018-04-30 DIAGNOSIS — M5136 Other intervertebral disc degeneration, lumbar region: Secondary | ICD-10-CM | POA: Diagnosis not present

## 2018-05-01 DIAGNOSIS — N6310 Unspecified lump in the right breast, unspecified quadrant: Secondary | ICD-10-CM | POA: Diagnosis not present

## 2018-05-01 DIAGNOSIS — R928 Other abnormal and inconclusive findings on diagnostic imaging of breast: Secondary | ICD-10-CM | POA: Diagnosis not present

## 2018-05-01 DIAGNOSIS — N6315 Unspecified lump in the right breast, overlapping quadrants: Secondary | ICD-10-CM | POA: Diagnosis not present

## 2018-05-01 DIAGNOSIS — N6489 Other specified disorders of breast: Secondary | ICD-10-CM | POA: Diagnosis not present

## 2018-05-08 ENCOUNTER — Telehealth: Payer: Self-pay | Admitting: Neurology

## 2018-05-08 NOTE — Telephone Encounter (Signed)
Called pt and offered an opening seen next Monday  3/2 @ 8:30. Pt accepted and was advised to check in 15-30 minutes early. She verbalized appreciation. Canceled May appt.

## 2018-05-08 NOTE — Telephone Encounter (Signed)
Pt called she is wanting injections. Pt said the HA's are not worse but they are not any better. An appt has been scheduled for 5/6. She is wanting to be seen sooner if possible. Please call to advise

## 2018-05-10 DIAGNOSIS — F331 Major depressive disorder, recurrent, moderate: Secondary | ICD-10-CM | POA: Diagnosis not present

## 2018-05-10 DIAGNOSIS — E782 Mixed hyperlipidemia: Secondary | ICD-10-CM | POA: Diagnosis not present

## 2018-05-10 DIAGNOSIS — I10 Essential (primary) hypertension: Secondary | ICD-10-CM | POA: Diagnosis not present

## 2018-05-13 ENCOUNTER — Ambulatory Visit (INDEPENDENT_AMBULATORY_CARE_PROVIDER_SITE_OTHER): Payer: Medicare HMO | Admitting: Neurology

## 2018-05-13 ENCOUNTER — Encounter: Payer: Self-pay | Admitting: Neurology

## 2018-05-13 ENCOUNTER — Telehealth: Payer: Self-pay | Admitting: Neurology

## 2018-05-13 VITALS — BP 155/77 | HR 68

## 2018-05-13 DIAGNOSIS — M542 Cervicalgia: Secondary | ICD-10-CM

## 2018-05-13 DIAGNOSIS — R29898 Other symptoms and signs involving the musculoskeletal system: Secondary | ICD-10-CM | POA: Diagnosis not present

## 2018-05-13 DIAGNOSIS — M5481 Occipital neuralgia: Secondary | ICD-10-CM

## 2018-05-13 DIAGNOSIS — R200 Anesthesia of skin: Secondary | ICD-10-CM | POA: Diagnosis not present

## 2018-05-13 DIAGNOSIS — M5412 Radiculopathy, cervical region: Secondary | ICD-10-CM

## 2018-05-13 DIAGNOSIS — M7918 Myalgia, other site: Secondary | ICD-10-CM | POA: Diagnosis not present

## 2018-05-13 DIAGNOSIS — G8929 Other chronic pain: Secondary | ICD-10-CM | POA: Diagnosis not present

## 2018-05-13 MED ORDER — METHYLPREDNISOLONE 4 MG PO TBPK: ORAL_TABLET | ORAL | 1 refills | Status: DC

## 2018-05-13 NOTE — Telephone Encounter (Signed)
Mcarthur Rossetti Josem Kaufmann: [phone] (exp. 05/13/18 to 06/12/18) medicaid order sent to GI. They will reach out to the pt to schedule.

## 2018-05-13 NOTE — Progress Notes (Signed)
Nerve block w/o steroid: Pt signed consent  0.5% Bupivocaine 4.5 mL LOT: [id number] EXP: 10/2019 NDC: 70177-939-03  2% Lidocaine 4.5 mL LOT: [id number] EXP: 10/12/2018 NDC: 0092-3300-76

## 2018-05-13 NOTE — Telephone Encounter (Signed)
[place]/medicaid pending faxed clinical notes

## 2018-05-13 NOTE — Patient Instructions (Signed)
- Discussed Referral to Dr. Babs Bertin [facility] for eval of pain procedures for occipital nerve pain.  - PT DRY NEEDLING for cervical myofascial pain   - 6 days medrol dosepak  - MRI cervical spine: Ongoing neck pain, radicular symptoms, weakness and numbness of right arm. Failed PT and conservative treatment. > 6 months.  Methylprednisolone tablets What is this medicine? METHYLPREDNISOLONE (meth ill pred NISS oh lone) is a corticosteroid. It is commonly used to treat inflammation of the skin, joints, lungs, and other organs. Common conditions treated include asthma, allergies, and arthritis. It is also used for other conditions, such as blood disorders and diseases of the adrenal glands. This medicine may be used for other purposes; ask your health care provider or pharmacist if you have questions. COMMON BRAND NAME(S): Medrol, Medrol Dosepak What should I tell my health care provider before I take this medicine? They need to know if you have any of these conditions: -Cushing's syndrome -eye disease, vision problems -diabetes -glaucoma -heart disease -high blood pressure -infection (especially a virus infection such as chickenpox, cold sores, or herpes) -liver disease -mental illness -myasthenia gravis -osteoporosis -recently received or scheduled to receive a vaccine -seizures -stomach or intestine problems -thyroid disease -an unusual or allergic reaction to lactose, methylprednisolone, other medicines, foods, dyes, or preservatives -pregnant or trying to get pregnant -breast-feeding How should I use this medicine? Take this medicine by mouth with a glass of water. Follow the directions on the prescription label. Take this medicine with food. If you are taking this medicine once a day, take it in the morning. Do not take it more often than directed. Do not suddenly stop taking your medicine because you may develop a severe reaction. Your doctor will tell you how much  medicine to take. If your doctor wants you to stop the medicine, the dose may be slowly lowered over time to avoid any side effects. Talk to your pediatrician regarding the use of this medicine in children. Special care may be needed. Overdosage: If you think you have taken too much of this medicine contact a poison control center or emergency room at once. NOTE: This medicine is only for you. Do not share this medicine with others. What if I miss a dose? If you miss a dose, take it as soon as you can. If it is almost time for your next dose, talk to your doctor or health care professional. You may need to miss a dose or take an extra dose. Do not take double or extra doses without advice. What may interact with this medicine? Do not take this medicine with any of the following medications: -alefacept -echinacea -live virus vaccines -metyrapone -mifepristone This medicine may also interact with the following medications: -amphotericin B -aspirin and aspirin-like medicines -certain antibiotics like erythromycin, clarithromycin, troleandomycin -certain medicines for diabetes -certain medicines for fungal infections like ketoconazole -certain medicines for seizures like carbamazepine, phenobarbital, phenytoin -certain medicines that treat or prevent blood clots like warfarin -cholestyramine -cyclosporine -digoxin -diuretics -female hormones, like estrogens and birth control pills -isoniazid -NSAIDs, medicines for pain inflammation, like ibuprofen or naproxen -other medicines for myasthenia gravis -rifampin -vaccines This list may not describe all possible interactions. Give your health care provider a list of all the medicines, herbs, non-prescription drugs, or dietary supplements you use. Also tell them if you smoke, drink alcohol, or use illegal drugs. Some items may interact with your medicine. What should I watch for while using this medicine? Tell your doctor or  healthcare  professional if your symptoms do not start to get better or if they get worse. Do not stop taking except on your doctor's advice. You may develop a severe reaction. Your doctor will tell you how much medicine to take. This medicine may increase your risk of getting an infection. Tell your doctor or health care professional if you are around anyone with measles or chickenpox, or if you develop sores or blisters that do not heal properly. This medicine may affect blood sugar levels. If you have diabetes, check with your doctor or health care professional before you change your diet or the dose of your diabetic medicine. Tell your doctor or health care professional right away if you have any change in your eyesight. Using this medicine for a long time may increase your risk of low bone mass. Talk to your doctor about bone health. What side effects may I notice from receiving this medicine? Side effects that you should report to your doctor or health care professional as soon as possible: -allergic reactions like skin rash, itching or hives, swelling of the face, lips, or tongue -bloody or tarry stools -changes in vision -hallucination, loss of contact with reality -muscle cramps -muscle pain -palpitations -signs and symptoms of high blood sugar such as dizziness; dry mouth; dry skin; fruity breath; nausea; stomach pain; increased hunger or thirst; increased urination -signs and symptoms of infection like fever or chills; cough; sore throat; pain or trouble passing urine -trouble passing urine or change in the amount of urine Side effects that usually do not require medical attention (report to your doctor or health care professional if they continue or are bothersome): -changes in emotions or mood -constipation -diarrhea -excessive hair growth on the face or body -headache -nausea, vomiting -trouble sleeping -weight gain This list may not describe all possible side effects. Call your doctor  for medical advice about side effects. You may report side effects to FDA at 1-800-FDA-1088. Where should I keep my medicine? Keep out of the reach of children. Store at room temperature between 20 and 25 degrees C (68 and 77 degrees F). Throw away any unused medicine after the expiration date. NOTE: This sheet is a summary. It may not cover all possible information. If you have questions about this medicine, talk to your doctor, pharmacist, or health care provider.  2019 Elsevier/Gold Standard (2015-05-06 15:53:30)
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
## 2018-05-14 ENCOUNTER — Ambulatory Visit: Payer: Medicare HMO | Attending: Nurse Practitioner | Admitting: Physical Therapy

## 2018-05-14 ENCOUNTER — Encounter: Payer: Self-pay | Admitting: Physical Therapy

## 2018-05-14 DIAGNOSIS — M542 Cervicalgia: Secondary | ICD-10-CM | POA: Diagnosis not present

## 2018-05-14 DIAGNOSIS — R293 Abnormal posture: Secondary | ICD-10-CM | POA: Diagnosis not present
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
## 2018-05-16 NOTE — Addendum Note (Signed)
Addended by: Jaylin Benzel, [place] W on: 05/16/2018 01:06 PM   Modules accepted: Orders

## 2018-05-20 ENCOUNTER — Encounter: Payer: Self-pay | Admitting: Physical Therapy

## 2018-05-20 ENCOUNTER — Ambulatory Visit (INDEPENDENT_AMBULATORY_CARE_PROVIDER_SITE_OTHER): Payer: Medicare HMO | Admitting: Otolaryngology

## 2018-05-20 ENCOUNTER — Ambulatory Visit: Payer: Medicare HMO | Admitting: Physical Therapy

## 2018-05-20 DIAGNOSIS — M542 Cervicalgia: Secondary | ICD-10-CM

## 2018-05-20 DIAGNOSIS — J320 Chronic maxillary sinusitis: Secondary | ICD-10-CM | POA: Diagnosis not present

## 2018-05-20 DIAGNOSIS — J310 Chronic rhinitis: Secondary | ICD-10-CM

## 2018-05-20 DIAGNOSIS — H608X3 Other otitis externa, bilateral: Secondary | ICD-10-CM | POA: Diagnosis not present

## 2018-05-20 DIAGNOSIS — R293 Abnormal posture: Secondary | ICD-10-CM | POA: Diagnosis not present
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
## 2018-05-23 ENCOUNTER — Ambulatory Visit: Payer: Medicare HMO | Admitting: Physical Therapy

## 2018-05-23 ENCOUNTER — Other Ambulatory Visit: Payer: Self-pay

## 2018-05-23 DIAGNOSIS — M542 Cervicalgia: Secondary | ICD-10-CM | POA: Diagnosis not present

## 2018-05-23 DIAGNOSIS — R293 Abnormal posture: Secondary | ICD-10-CM | POA: Diagnosis not present
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
## 2018-05-26 ENCOUNTER — Other Ambulatory Visit: Payer: Medicare HMO

## 2018-05-28 ENCOUNTER — Other Ambulatory Visit: Payer: Self-pay

## 2018-05-28 ENCOUNTER — Ambulatory Visit: Payer: Medicare HMO | Admitting: Physical Therapy

## 2018-05-28 DIAGNOSIS — R293 Abnormal posture: Secondary | ICD-10-CM

## 2018-05-28 DIAGNOSIS — M542 Cervicalgia: Secondary | ICD-10-CM
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
## 2018-05-30 ENCOUNTER — Ambulatory Visit: Payer: Medicare HMO | Admitting: Physical Therapy

## 2018-05-30 ENCOUNTER — Other Ambulatory Visit: Payer: Self-pay

## 2018-05-30 DIAGNOSIS — M542 Cervicalgia: Secondary | ICD-10-CM | POA: Diagnosis not present

## 2018-05-30 DIAGNOSIS — R293 Abnormal posture: Secondary | ICD-10-CM | POA: Diagnosis not present
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
## 2018-06-04 ENCOUNTER — Ambulatory Visit: Payer: Medicare HMO | Admitting: *Deleted

## 2018-06-06 ENCOUNTER — Encounter: Payer: Medicare HMO | Admitting: *Deleted

## 2018-06-18 ENCOUNTER — Ambulatory Visit: Payer: Medicare HMO | Admitting: Physical Therapy

## 2018-06-20 ENCOUNTER — Ambulatory Visit: Payer: Medicare HMO | Admitting: Physical Therapy

## 2018-06-20 DIAGNOSIS — M722 Plantar fascial fibromatosis: Secondary | ICD-10-CM | POA: Diagnosis not present

## 2018-06-20 DIAGNOSIS — G6289 Other specified polyneuropathies: Secondary | ICD-10-CM | POA: Diagnosis not present

## 2018-06-21 ENCOUNTER — Telehealth: Payer: Self-pay | Admitting: Physical Therapy

## 2018-06-21 ENCOUNTER — Other Ambulatory Visit: Payer: Medicare HMO

## 2018-06-27 ENCOUNTER — Ambulatory Visit: Payer: Medicare HMO | Attending: Nurse Practitioner | Admitting: Physical Therapy

## 2018-06-27 ENCOUNTER — Encounter: Payer: Self-pay | Admitting: Physical Therapy

## 2018-06-27 ENCOUNTER — Other Ambulatory Visit: Payer: Self-pay

## 2018-06-27 DIAGNOSIS — M542 Cervicalgia: Secondary | ICD-10-CM | POA: Insufficient documentation

## 2018-06-27 DIAGNOSIS — R293 Abnormal posture: Secondary | ICD-10-CM | POA: Insufficient documentation
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
## 2018-07-02 ENCOUNTER — Ambulatory Visit: Payer: Medicare HMO | Admitting: Physical Therapy

## 2018-07-02 ENCOUNTER — Encounter: Payer: Self-pay | Admitting: Physical Therapy

## 2018-07-02 ENCOUNTER — Other Ambulatory Visit: Payer: Self-pay

## 2018-07-02 DIAGNOSIS — R293 Abnormal posture: Secondary | ICD-10-CM

## 2018-07-02 DIAGNOSIS — M542 Cervicalgia: Secondary | ICD-10-CM | POA: Diagnosis not present
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
## 2018-07-04 ENCOUNTER — Ambulatory Visit: Payer: Medicare HMO | Admitting: Physical Therapy

## 2018-07-04 ENCOUNTER — Encounter: Payer: Self-pay | Admitting: Physical Therapy

## 2018-07-04 DIAGNOSIS — R293 Abnormal posture: Secondary | ICD-10-CM

## 2018-07-04 DIAGNOSIS — M542 Cervicalgia: Secondary | ICD-10-CM | POA: Diagnosis not present
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
## 2018-07-09 ENCOUNTER — Ambulatory Visit: Payer: Medicare HMO | Admitting: Physical Therapy

## 2018-07-09 ENCOUNTER — Encounter: Payer: Self-pay | Admitting: Physical Therapy

## 2018-07-09 ENCOUNTER — Other Ambulatory Visit: Payer: Self-pay

## 2018-07-09 DIAGNOSIS — R293 Abnormal posture: Secondary | ICD-10-CM | POA: Diagnosis not present

## 2018-07-09 DIAGNOSIS — M542 Cervicalgia: Secondary | ICD-10-CM
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
## 2018-07-11 ENCOUNTER — Encounter: Payer: Self-pay | Admitting: Physical Therapy

## 2018-07-11 ENCOUNTER — Other Ambulatory Visit: Payer: Self-pay

## 2018-07-11 ENCOUNTER — Ambulatory Visit: Payer: Medicare HMO | Admitting: Physical Therapy

## 2018-07-11 DIAGNOSIS — R293 Abnormal posture: Secondary | ICD-10-CM

## 2018-07-11 DIAGNOSIS — M542 Cervicalgia: Secondary | ICD-10-CM | POA: Diagnosis not present

## 2018-07-11 DIAGNOSIS — I10 Essential (primary) hypertension: Secondary | ICD-10-CM | POA: Diagnosis not present

## 2018-07-11 DIAGNOSIS — E782 Mixed hyperlipidemia: Secondary | ICD-10-CM | POA: Diagnosis not present
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
## 2018-07-15 ENCOUNTER — Ambulatory Visit: Payer: Medicare HMO | Attending: Nurse Practitioner | Admitting: Physical Therapy

## 2018-07-15 ENCOUNTER — Other Ambulatory Visit: Payer: Self-pay

## 2018-07-15 DIAGNOSIS — M542 Cervicalgia: Secondary | ICD-10-CM | POA: Insufficient documentation

## 2018-07-15 DIAGNOSIS — R293 Abnormal posture: Secondary | ICD-10-CM | POA: Diagnosis not present
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
## 2018-07-17 ENCOUNTER — Ambulatory Visit: Payer: Medicare HMO | Admitting: Neurology

## 2018-07-17 DIAGNOSIS — J301 Allergic rhinitis due to pollen: Secondary | ICD-10-CM | POA: Diagnosis not present

## 2018-07-17 DIAGNOSIS — J45901 Unspecified asthma with (acute) exacerbation: Secondary | ICD-10-CM | POA: Diagnosis not present

## 2018-07-17 DIAGNOSIS — J0100 Acute maxillary sinusitis, unspecified: Secondary | ICD-10-CM | POA: Diagnosis not present

## 2018-07-22 ENCOUNTER — Ambulatory Visit: Payer: Medicare HMO | Admitting: Physical Therapy

## 2018-07-22 ENCOUNTER — Other Ambulatory Visit: Payer: Self-pay

## 2018-07-22 DIAGNOSIS — M542 Cervicalgia: Secondary | ICD-10-CM | POA: Diagnosis not present

## 2018-07-22 DIAGNOSIS — R293 Abnormal posture: Secondary | ICD-10-CM | POA: Diagnosis not present
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
## 2018-07-25 DIAGNOSIS — R946 Abnormal results of thyroid function studies: Secondary | ICD-10-CM | POA: Diagnosis not present

## 2018-07-29 DIAGNOSIS — I10 Essential (primary) hypertension: Secondary | ICD-10-CM | POA: Diagnosis not present

## 2018-07-29 DIAGNOSIS — E782 Mixed hyperlipidemia: Secondary | ICD-10-CM | POA: Diagnosis not present

## 2018-07-29 DIAGNOSIS — I700 Atherosclerosis of aorta: Secondary | ICD-10-CM | POA: Diagnosis not present

## 2018-07-29 DIAGNOSIS — F331 Major depressive disorder, recurrent, moderate: Secondary | ICD-10-CM | POA: Diagnosis not present

## 2018-07-29 DIAGNOSIS — G894 Chronic pain syndrome: Secondary | ICD-10-CM | POA: Diagnosis not present

## 2018-07-29 DIAGNOSIS — M47816 Spondylosis without myelopathy or radiculopathy, lumbar region: Secondary | ICD-10-CM | POA: Diagnosis not present

## 2018-07-29 DIAGNOSIS — M5481 Occipital neuralgia: Secondary | ICD-10-CM | POA: Diagnosis not present

## 2018-07-29 DIAGNOSIS — J45909 Unspecified asthma, uncomplicated: Secondary | ICD-10-CM | POA: Diagnosis not present

## 2018-07-29 DIAGNOSIS — F1190 Opioid use, unspecified, uncomplicated: Secondary | ICD-10-CM | POA: Diagnosis not present

## 2018-07-29 DIAGNOSIS — R7301 Impaired fasting glucose: Secondary | ICD-10-CM | POA: Diagnosis not present

## 2018-08-02 ENCOUNTER — Other Ambulatory Visit: Payer: Medicare HMO

## 2018-08-10 DIAGNOSIS — I10 Essential (primary) hypertension: Secondary | ICD-10-CM | POA: Diagnosis not present

## 2018-08-10 DIAGNOSIS — E785 Hyperlipidemia, unspecified: Secondary | ICD-10-CM | POA: Diagnosis not present

## 2018-08-12 NOTE — Telephone Encounter (Signed)
I updated the auth the auth number is still the same the expire days are 08/12/18 to 09/11/18 patient is scheduled at GI for 08/13/18.

## 2018-08-13 ENCOUNTER — Other Ambulatory Visit: Payer: Self-pay

## 2018-08-13 ENCOUNTER — Ambulatory Visit
Admission: RE | Admit: 2018-08-13 | Discharge: 2018-08-13 | Disposition: A | Discharge status: Home / Self Care | Payer: Medicare HMO | Source: Ambulatory Visit | Attending: Neurology | Admitting: Neurology

## 2018-08-13 DIAGNOSIS — M542 Cervicalgia: Secondary | ICD-10-CM

## 2018-08-13 DIAGNOSIS — R200 Anesthesia of skin: Secondary | ICD-10-CM

## 2018-08-13 DIAGNOSIS — M5412 Radiculopathy, cervical region: Secondary | ICD-10-CM

## 2018-08-13 DIAGNOSIS — R29898 Other symptoms and signs involving the musculoskeletal system: Secondary | ICD-10-CM

## 2018-08-13 DIAGNOSIS — G8929 Other chronic pain: Secondary | ICD-10-CM | POA: Diagnosis not present

## 2018-08-13 DIAGNOSIS — M5481 Occipital neuralgia: Secondary | ICD-10-CM | POA: Diagnosis not present

## 2018-08-16 DIAGNOSIS — M5481 Occipital neuralgia: Secondary | ICD-10-CM | POA: Diagnosis not present

## 2018-08-20 ENCOUNTER — Telehealth: Payer: Self-pay | Admitting: *Deleted

## 2018-08-20 NOTE — Telephone Encounter (Signed)
-----   Message from Melvenia Beam, MD sent at 08/19/2018 12:13 PM EDT ----- MRI of the cervical spine looks very good. This is some very mild disk bulging that is expected for age but nothing significant and no pinched nerves. thanks

## 2018-08-20 NOTE — Telephone Encounter (Signed)
Pt has called, the message was relayed to her from [employer]

## 2018-08-20 NOTE — Telephone Encounter (Signed)
Called patient and LVM asking for call back. Left office number in message.   When she calls back, please let her know that MRI of the cervical spine looks very good. We did see some very mild disk bulging that is expected for her age but no pinched nerves and nothing significant.

## 2018-08-20 NOTE — Telephone Encounter (Signed)
Noted thanks °

## 2018-09-10 DIAGNOSIS — F331 Major depressive disorder, recurrent, moderate: Secondary | ICD-10-CM | POA: Diagnosis not present

## 2018-09-10 DIAGNOSIS — E782 Mixed hyperlipidemia: Secondary | ICD-10-CM | POA: Diagnosis not present

## 2018-09-10 DIAGNOSIS — I10 Essential (primary) hypertension: Secondary | ICD-10-CM | POA: Diagnosis not present

## 2018-09-25 DIAGNOSIS — I10 Essential (primary) hypertension: Secondary | ICD-10-CM | POA: Diagnosis not present

## 2018-09-25 DIAGNOSIS — Z0100 Encounter for examination of eyes and vision without abnormal findings: Secondary | ICD-10-CM | POA: Diagnosis not present

## 2018-09-25 DIAGNOSIS — H5210 Myopia, unspecified eye: Secondary | ICD-10-CM | POA: Diagnosis not present

## 2018-09-27 DIAGNOSIS — M47812 Spondylosis without myelopathy or radiculopathy, cervical region: Secondary | ICD-10-CM | POA: Diagnosis not present

## 2018-09-27 DIAGNOSIS — M5136 Other intervertebral disc degeneration, lumbar region: Secondary | ICD-10-CM | POA: Diagnosis not present

## 2018-09-27 DIAGNOSIS — M47816 Spondylosis without myelopathy or radiculopathy, lumbar region: Secondary | ICD-10-CM | POA: Diagnosis not present

## 2018-09-27 DIAGNOSIS — M797 Fibromyalgia: Secondary | ICD-10-CM | POA: Diagnosis not present

## 2018-10-11 DIAGNOSIS — T161XXA Foreign body in right ear, initial encounter: Secondary | ICD-10-CM | POA: Diagnosis not present

## 2018-10-11 DIAGNOSIS — M47812 Spondylosis without myelopathy or radiculopathy, cervical region: Secondary | ICD-10-CM | POA: Diagnosis not present

## 2018-10-11 DIAGNOSIS — Z86718 Personal history of other venous thrombosis and embolism: Secondary | ICD-10-CM | POA: Diagnosis not present

## 2018-10-11 DIAGNOSIS — F329 Major depressive disorder, single episode, unspecified: Secondary | ICD-10-CM | POA: Diagnosis not present

## 2018-10-11 DIAGNOSIS — Z79899 Other long term (current) drug therapy: Secondary | ICD-10-CM | POA: Diagnosis not present

## 2018-10-11 DIAGNOSIS — M797 Fibromyalgia: Secondary | ICD-10-CM | POA: Diagnosis not present

## 2018-10-11 DIAGNOSIS — K219 Gastro-esophageal reflux disease without esophagitis: Secondary | ICD-10-CM | POA: Diagnosis not present

## 2018-10-11 DIAGNOSIS — H65191 Other acute nonsuppurative otitis media, right ear: Secondary | ICD-10-CM | POA: Diagnosis not present

## 2018-10-11 DIAGNOSIS — Z86711 Personal history of pulmonary embolism: Secondary | ICD-10-CM | POA: Diagnosis not present

## 2018-10-11 DIAGNOSIS — F419 Anxiety disorder, unspecified: Secondary | ICD-10-CM | POA: Diagnosis not present

## 2018-11-04 DIAGNOSIS — M47812 Spondylosis without myelopathy or radiculopathy, cervical region: Secondary | ICD-10-CM | POA: Diagnosis not present

## 2018-11-11 DIAGNOSIS — E782 Mixed hyperlipidemia: Secondary | ICD-10-CM | POA: Diagnosis not present

## 2018-11-11 DIAGNOSIS — I10 Essential (primary) hypertension: Secondary | ICD-10-CM | POA: Diagnosis not present

## 2018-11-15 DIAGNOSIS — Z20828 Contact with and (suspected) exposure to other viral communicable diseases: Secondary | ICD-10-CM | POA: Diagnosis not present

## 2018-11-15 DIAGNOSIS — M47812 Spondylosis without myelopathy or radiculopathy, cervical region: Secondary | ICD-10-CM | POA: Diagnosis not present

## 2018-11-15 DIAGNOSIS — Z01812 Encounter for preprocedural laboratory examination: Secondary | ICD-10-CM | POA: Diagnosis not present

## 2018-11-26 DIAGNOSIS — K210 Gastro-esophageal reflux disease with esophagitis: Secondary | ICD-10-CM | POA: Diagnosis not present

## 2018-11-26 DIAGNOSIS — E782 Mixed hyperlipidemia: Secondary | ICD-10-CM | POA: Diagnosis not present

## 2018-11-26 DIAGNOSIS — R7301 Impaired fasting glucose: Secondary | ICD-10-CM | POA: Diagnosis not present

## 2018-11-26 DIAGNOSIS — E039 Hypothyroidism, unspecified: Secondary | ICD-10-CM | POA: Diagnosis not present

## 2018-11-26 DIAGNOSIS — I10 Essential (primary) hypertension: Secondary | ICD-10-CM | POA: Diagnosis not present

## 2018-11-28 ENCOUNTER — Other Ambulatory Visit: Payer: Self-pay | Admitting: Family Medicine

## 2018-11-28 DIAGNOSIS — M5136 Other intervertebral disc degeneration, lumbar region: Secondary | ICD-10-CM | POA: Diagnosis not present

## 2018-11-28 DIAGNOSIS — E782 Mixed hyperlipidemia: Secondary | ICD-10-CM | POA: Diagnosis not present

## 2018-11-28 DIAGNOSIS — Z23 Encounter for immunization: Secondary | ICD-10-CM | POA: Diagnosis not present

## 2018-11-28 DIAGNOSIS — F331 Major depressive disorder, recurrent, moderate: Secondary | ICD-10-CM | POA: Diagnosis not present

## 2018-11-28 DIAGNOSIS — F1190 Opioid use, unspecified, uncomplicated: Secondary | ICD-10-CM | POA: Diagnosis not present

## 2018-11-28 DIAGNOSIS — I2699 Other pulmonary embolism without acute cor pulmonale: Secondary | ICD-10-CM | POA: Diagnosis not present

## 2018-11-28 DIAGNOSIS — J45909 Unspecified asthma, uncomplicated: Secondary | ICD-10-CM | POA: Diagnosis not present

## 2018-11-28 DIAGNOSIS — I10 Essential (primary) hypertension: Secondary | ICD-10-CM | POA: Diagnosis not present

## 2018-11-28 DIAGNOSIS — Z86718 Personal history of other venous thrombosis and embolism: Secondary | ICD-10-CM

## 2018-11-28 DIAGNOSIS — I700 Atherosclerosis of aorta: Secondary | ICD-10-CM | POA: Diagnosis not present

## 2018-11-29 DIAGNOSIS — Z01818 Encounter for other preprocedural examination: Secondary | ICD-10-CM | POA: Diagnosis not present

## 2018-12-03 DIAGNOSIS — M47812 Spondylosis without myelopathy or radiculopathy, cervical region: Secondary | ICD-10-CM | POA: Diagnosis not present

## 2018-12-03 DIAGNOSIS — M47816 Spondylosis without myelopathy or radiculopathy, lumbar region: Secondary | ICD-10-CM | POA: Diagnosis not present

## 2018-12-03 DIAGNOSIS — M797 Fibromyalgia: Secondary | ICD-10-CM | POA: Diagnosis not present

## 2018-12-03 DIAGNOSIS — M5030 Other cervical disc degeneration, unspecified cervical region: Secondary | ICD-10-CM | POA: Diagnosis not present

## 2018-12-03 DIAGNOSIS — Q969 Turner's syndrome, unspecified: Secondary | ICD-10-CM | POA: Diagnosis not present

## 2018-12-03 DIAGNOSIS — Z86711 Personal history of pulmonary embolism: Secondary | ICD-10-CM | POA: Diagnosis not present

## 2018-12-03 DIAGNOSIS — Z86718 Personal history of other venous thrombosis and embolism: Secondary | ICD-10-CM | POA: Diagnosis not present

## 2018-12-03 DIAGNOSIS — I10 Essential (primary) hypertension: Secondary | ICD-10-CM | POA: Diagnosis not present

## 2018-12-03 DIAGNOSIS — F329 Major depressive disorder, single episode, unspecified: Secondary | ICD-10-CM | POA: Diagnosis not present

## 2018-12-05 ENCOUNTER — Encounter: Payer: Self-pay | Admitting: *Deleted

## 2018-12-05 ENCOUNTER — Other Ambulatory Visit: Payer: Self-pay | Admitting: Interventional Radiology

## 2018-12-05 ENCOUNTER — Ambulatory Visit
Admission: RE | Admit: 2018-12-05 | Discharge: 2018-12-05 | Disposition: A | Discharge status: Home / Self Care | Payer: Medicare HMO | Source: Ambulatory Visit | Attending: Family Medicine | Admitting: Family Medicine

## 2018-12-05 DIAGNOSIS — Z95828 Presence of other vascular implants and grafts: Secondary | ICD-10-CM

## 2018-12-05 DIAGNOSIS — I2699 Other pulmonary embolism without acute cor pulmonale: Secondary | ICD-10-CM | POA: Diagnosis not present

## 2018-12-05 DIAGNOSIS — Z86718 Personal history of other venous thrombosis and embolism: Secondary | ICD-10-CM

## 2018-12-05 HISTORY — PX: IR RADIOLOGIST EVAL & MGMT: IMG5224
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
## 2018-12-06 DIAGNOSIS — Z01818 Encounter for other preprocedural examination: Secondary | ICD-10-CM | POA: Diagnosis not present

## 2018-12-08 ENCOUNTER — Other Ambulatory Visit: Payer: Self-pay | Admitting: Neurology

## 2018-12-08 DIAGNOSIS — F0781 Postconcussional syndrome: Secondary | ICD-10-CM

## 2018-12-10 DIAGNOSIS — Z86718 Personal history of other venous thrombosis and embolism: Secondary | ICD-10-CM | POA: Diagnosis not present

## 2018-12-10 DIAGNOSIS — Z79899 Other long term (current) drug therapy: Secondary | ICD-10-CM | POA: Diagnosis not present

## 2018-12-10 DIAGNOSIS — K219 Gastro-esophageal reflux disease without esophagitis: Secondary | ICD-10-CM | POA: Diagnosis not present

## 2018-12-10 DIAGNOSIS — M797 Fibromyalgia: Secondary | ICD-10-CM | POA: Diagnosis not present

## 2018-12-10 DIAGNOSIS — M47812 Spondylosis without myelopathy or radiculopathy, cervical region: Secondary | ICD-10-CM | POA: Diagnosis not present

## 2018-12-10 DIAGNOSIS — F329 Major depressive disorder, single episode, unspecified: Secondary | ICD-10-CM | POA: Diagnosis not present

## 2018-12-10 DIAGNOSIS — M5126 Other intervertebral disc displacement, lumbar region: Secondary | ICD-10-CM | POA: Diagnosis not present

## 2018-12-10 DIAGNOSIS — Z7901 Long term (current) use of anticoagulants: Secondary | ICD-10-CM | POA: Diagnosis not present

## 2018-12-10 DIAGNOSIS — Z7951 Long term (current) use of inhaled steroids: Secondary | ICD-10-CM | POA: Diagnosis not present

## 2018-12-11 DIAGNOSIS — I10 Essential (primary) hypertension: Secondary | ICD-10-CM | POA: Diagnosis not present

## 2018-12-11 DIAGNOSIS — E782 Mixed hyperlipidemia: Secondary | ICD-10-CM | POA: Diagnosis not present

## 2018-12-12 ENCOUNTER — Ambulatory Visit (INDEPENDENT_AMBULATORY_CARE_PROVIDER_SITE_OTHER): Payer: Medicare HMO | Admitting: Otolaryngology

## 2018-12-12 ENCOUNTER — Other Ambulatory Visit: Payer: Self-pay

## 2018-12-12 DIAGNOSIS — J310 Chronic rhinitis: Secondary | ICD-10-CM

## 2018-12-23 DIAGNOSIS — J309 Allergic rhinitis, unspecified: Secondary | ICD-10-CM | POA: Diagnosis not present

## 2018-12-23 DIAGNOSIS — J4540 Moderate persistent asthma, uncomplicated: Secondary | ICD-10-CM | POA: Diagnosis not present

## 2018-12-23 DIAGNOSIS — T781XXD Other adverse food reactions, not elsewhere classified, subsequent encounter: Secondary | ICD-10-CM | POA: Diagnosis not present

## 2018-12-23 DIAGNOSIS — H1045 Other chronic allergic conjunctivitis: Secondary | ICD-10-CM | POA: Diagnosis not present

## 2018-12-24 ENCOUNTER — Other Ambulatory Visit: Payer: Self-pay | Admitting: Physician Assistant

## 2018-12-24 ENCOUNTER — Other Ambulatory Visit: Payer: Self-pay | Admitting: Radiology

## 2018-12-25 ENCOUNTER — Other Ambulatory Visit: Payer: Self-pay

## 2018-12-25 ENCOUNTER — Encounter (HOSPITAL_COMMUNITY): Payer: Self-pay

## 2018-12-25 ENCOUNTER — Ambulatory Visit (HOSPITAL_COMMUNITY)
Admission: RE | Admit: 2018-12-25 | Discharge: 2018-12-25 | Disposition: A | Discharge status: Home / Self Care | Payer: Medicare HMO | Source: Ambulatory Visit | Attending: Interventional Radiology | Admitting: Interventional Radiology

## 2018-12-25 DIAGNOSIS — I10 Essential (primary) hypertension: Secondary | ICD-10-CM | POA: Insufficient documentation

## 2018-12-25 DIAGNOSIS — Z7901 Long term (current) use of anticoagulants: Secondary | ICD-10-CM | POA: Diagnosis not present

## 2018-12-25 DIAGNOSIS — K219 Gastro-esophageal reflux disease without esophagitis: Secondary | ICD-10-CM | POA: Insufficient documentation

## 2018-12-25 DIAGNOSIS — Z452 Encounter for adjustment and management of vascular access device: Secondary | ICD-10-CM | POA: Insufficient documentation

## 2018-12-25 DIAGNOSIS — Z86718 Personal history of other venous thrombosis and embolism: Secondary | ICD-10-CM | POA: Insufficient documentation

## 2018-12-25 DIAGNOSIS — Z86711 Personal history of pulmonary embolism: Secondary | ICD-10-CM | POA: Insufficient documentation

## 2018-12-25 DIAGNOSIS — M797 Fibromyalgia: Secondary | ICD-10-CM | POA: Diagnosis not present

## 2018-12-25 DIAGNOSIS — Z79899 Other long term (current) drug therapy: Secondary | ICD-10-CM | POA: Diagnosis not present

## 2018-12-25 DIAGNOSIS — F419 Anxiety disorder, unspecified: Secondary | ICD-10-CM | POA: Insufficient documentation

## 2018-12-25 DIAGNOSIS — M1990 Unspecified osteoarthritis, unspecified site: Secondary | ICD-10-CM | POA: Diagnosis not present

## 2018-12-25 DIAGNOSIS — I82409 Acute embolism and thrombosis of unspecified deep veins of unspecified lower extremity: Secondary | ICD-10-CM | POA: Diagnosis not present

## 2018-12-25 DIAGNOSIS — Z95828 Presence of other vascular implants and grafts: Secondary | ICD-10-CM

## 2018-12-25 HISTORY — PX: IR IVC FILTER RETRIEVAL / S&I /IMG GUID/MOD SED: IMG5308

## 2018-12-25 LAB — BASIC METABOLIC PANEL
Anion gap: 11 (ref 5–15)
BUN: 13 mg/dL (ref 6–20)
CO2: 25 mmol/L (ref 22–32)
Calcium: 9.1 mg/dL (ref 8.9–10.3)
Chloride: 102 mmol/L (ref 98–111)
Creatinine, Ser: 0.95 mg/dL (ref 0.44–1.00)
GFR calc Af Amer: >60 mL/min (ref >60)
GFR calc non Af Amer: >60 mL/min (ref >60)
Glucose, Bld: 99 mg/dL (ref 70–99)
Potassium: 3.9 mmol/L (ref 3.5–5.1)
Sodium: 138 mmol/L (ref 135–145)

## 2018-12-25 LAB — CBC
HCT: 49.2 % — ABNORMAL HIGH (ref 36.0–46.0)
Hemoglobin: 15.7 g/dL — ABNORMAL HIGH (ref 12.0–15.0)
MCH: 28.6 pg (ref 26.0–34.0)
MCHC: 31.9 g/dL (ref 30.0–36.0)
MCV: 89.8 fL (ref 80.0–100.0)
Platelets: 339 10*3/uL (ref 150–400)
RBC: 5.48 MIL/uL — ABNORMAL HIGH (ref 3.87–5.11)
RDW: 13.2 % (ref 11.5–15.5)
WBC: 8.5 10*3/uL (ref 4.0–10.5)
nRBC: 0 % (ref 0.0–0.2)

## 2018-12-25 LAB — PROTIME-INR
INR: 1.1 (ref 0.8–1.2)
Prothrombin Time: 14 seconds (ref 11.4–15.2)

## 2018-12-25 MED ORDER — SODIUM CHLORIDE 0.9 % IV SOLN
INTRAVENOUS | Status: AC | PRN
  Administered 2018-12-25: 10 mL/h via INTRAVENOUS

## 2018-12-25 MED ORDER — MIDAZOLAM HCL 2 MG/2ML IJ SOLN
INTRAMUSCULAR | Status: AC | PRN
  Administered 2018-12-25 (×2): 1 mg via INTRAVENOUS

## 2018-12-25 MED ORDER — SODIUM CHLORIDE 0.9 % IV SOLN
INTRAVENOUS | Status: DC
  Administered 2018-12-25: 12:00:00 via INTRAVENOUS

## 2018-12-25 MED ORDER — FENTANYL CITRATE (PF) 100 MCG/2ML IJ SOLN
INTRAMUSCULAR | Status: AC
  Filled 2018-12-25: qty 4

## 2018-12-25 MED ORDER — IOHEXOL 300 MG/ML  SOLN
100.0000 mL | Freq: Once | INTRAMUSCULAR | Status: AC | PRN
  Administered 2018-12-25: 30 mL via INTRAVENOUS

## 2018-12-25 MED ORDER — FENTANYL CITRATE (PF) 100 MCG/2ML IJ SOLN
INTRAMUSCULAR | Status: AC | PRN
  Administered 2018-12-25 (×2): 50 ug via INTRAVENOUS

## 2018-12-25 MED ORDER — MIDAZOLAM HCL 2 MG/2ML IJ SOLN
INTRAMUSCULAR | Status: AC
  Filled 2018-12-25: qty 4

## 2018-12-25 MED ORDER — LIDOCAINE HCL 1 % IJ SOLN
INTRAMUSCULAR | Status: AC
  Administered 2018-12-25: 15:00:00
  Filled 2018-12-25: qty 20

## 2018-12-25 MED ORDER — IOHEXOL 300 MG/ML  SOLN
50.0000 mL | Freq: Once | INTRAMUSCULAR | Status: AC | PRN
  Administered 2018-12-25: 20 mL via INTRAVENOUS

## 2018-12-25 MED ORDER — HYDROCODONE-ACETAMINOPHEN 5-325 MG PO TABS: 1.0000 | ORAL_TABLET | ORAL | Status: DC | PRN
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
## 2018-12-25 NOTE — Discharge Instructions (Signed)
Inferior Vena Cava Filter Removal, Care After °This sheet gives you information about how to care for yourself after your procedure. Your health care provider may also give you more specific instructions. If you have problems or questions, contact your health care provider. °What can I expect after the procedure? °After the procedure, it is common to have: °· Mild pain and bruising around your incision in your neck or groin. °· Fatigue. °Follow these instructions at home: °Incision care ° °· Follow instructions from your health care provider about how to take care of your incision. Make sure you: °? Wash your hands with soap and water before you change your bandage (dressing). If soap and water are not available, use hand sanitizer. °? Change your dressing as told by your health care provider. °· Check your incision area every day for signs of infection. Check for: °? Redness, swelling, or more pain. °? Fluid or blood. °? Warmth. °? Pus or a bad smell. °General instructions °· Take over-the-counter and prescription medicines only as told by your health care provider. °· Do not take baths, swim, or use a hot tub until your health care provider approves. Ask your health care provider if you may take showers. You may only be allowed to take sponge baths. °· Do not drive for 24 hours if you were given a medicine to help you relax (sedative) during your procedure. °· Return to your normal activities as told by your health care provider. Ask your health care provider what activities are safe for you. °· Keep all follow-up visits as told by your health care provider. This is important. °Contact a health care provider if: °· You have chills or a fever. °· You have redness, swelling, or more pain around your incision. °· Your incision feels warm to the touch. °· You have pus or a bad smell coming from your incision. °Get help right away if: °· You have blood coming from your incision (active bleeding). °? If you have  bleeding from the incision site, lie down, apply pressure to the area with a clean cloth or gauze, and get help right away. °· You have chest pain. °· You have difficulty breathing. °Summary °· Follow instructions from your health care provider about how to take care of your incision. °· Return to your normal activities as told by your health care provider. °· Check your incision area every day for signs of infection. °· Get help right away if you have active bleeding, chest pain, or trouble breathing. °This information is not intended to replace advice given to you by your health care provider. Make sure you discuss any questions you have with your health care provider. °Document Released: 09/07/2016 Document Revised: 02/09/2017 Document Reviewed: 09/07/2016 °Elsevier Patient Education © 2020 [employer] Inc[employer] ° ° ° °Moderate Conscious Sedation, Adult, Care After °These instructions provide you with information about caring for yourself after your procedure. Your health care provider may also give you more specific instructions. Your treatment has been planned according to current medical practices, but problems sometimes occur. Call your health care provider if you have any problems or questions after your procedure. °What can I expect after the procedure? °After your procedure, it is common: °· To feel sleepy for several hours. °· To feel clumsy and have poor balance for several hours. °· To have poor judgment for several hours. °· To vomit if you eat too soon. °Follow these instructions at home: °For at least 24 hours after the procedure: ° °·   Do not: °? Participate in activities where you could fall or become injured. °? Drive. °? Use heavy machinery. °? Drink alcohol. °? Take sleeping pills or medicines that cause drowsiness. °? Make important decisions or sign legal documents. °? Take care of children on your own. °· Rest. °Eating and drinking °· Follow the diet recommended by your health care provider. °· If you  vomit: °? Drink water, juice, or soup when you can drink without vomiting. °? Make sure you have little or no nausea before eating solid foods. °General instructions °· Have a responsible adult stay with you until you are awake and alert. °· Take over-the-counter and prescription medicines only as told by your health care provider. °· If you smoke, do not smoke without supervision. °· Keep all follow-up visits as told by your health care provider. This is important. °Contact a health care provider if: °· You keep feeling nauseous or you keep vomiting. °· You feel light-headed. °· You develop a rash. °· You have a fever. °Get help right away if: °· You have trouble breathing. °This information is not intended to replace advice given to you by your health care provider. Make sure you discuss any questions you have with your health care provider. °Document Released: 12/18/2012 Document Revised: 02/09/2017 Document Reviewed: 06/19/2015 °Elsevier Patient Education © 2020 Elsevier Inc. ° °

## 2018-12-25 NOTE — Procedures (Signed)
  Procedure: IVCgram and filter retrieval   EBL:   minimal Complications:  none immediate  See full dictation in [facility].  D. Vollie Aaron MD Main # [phone] Pager  [phone]    

## 2018-12-27 ENCOUNTER — Other Ambulatory Visit (HOSPITAL_COMMUNITY): Payer: Medicare HMO

## 2018-12-27 ENCOUNTER — Ambulatory Visit (HOSPITAL_COMMUNITY): Payer: Medicare HMO

## 2019-01-08 DIAGNOSIS — M47816 Spondylosis without myelopathy or radiculopathy, lumbar region: Secondary | ICD-10-CM | POA: Diagnosis not present

## 2019-01-08 DIAGNOSIS — G8929 Other chronic pain: Secondary | ICD-10-CM | POA: Diagnosis not present

## 2019-01-08 DIAGNOSIS — G894 Chronic pain syndrome: Secondary | ICD-10-CM | POA: Diagnosis not present

## 2019-01-08 DIAGNOSIS — M4722 Other spondylosis with radiculopathy, cervical region: Secondary | ICD-10-CM | POA: Diagnosis not present

## 2019-01-08 DIAGNOSIS — M7918 Myalgia, other site: Secondary | ICD-10-CM | POA: Diagnosis not present

## 2019-01-08 DIAGNOSIS — M5030 Other cervical disc degeneration, unspecified cervical region: Secondary | ICD-10-CM | POA: Diagnosis not present

## 2019-01-08 DIAGNOSIS — M545 Low back pain: Secondary | ICD-10-CM | POA: Diagnosis not present

## 2019-01-08 DIAGNOSIS — Z79891 Long term (current) use of opiate analgesic: Secondary | ICD-10-CM | POA: Diagnosis not present

## 2019-01-08 DIAGNOSIS — M9971 Connective tissue and disc stenosis of intervertebral foramina of cervical region: Secondary | ICD-10-CM | POA: Diagnosis not present

## 2019-01-13 ENCOUNTER — Ambulatory Visit (INDEPENDENT_AMBULATORY_CARE_PROVIDER_SITE_OTHER): Payer: Medicare HMO | Admitting: Podiatry

## 2019-01-13 ENCOUNTER — Ambulatory Visit (INDEPENDENT_AMBULATORY_CARE_PROVIDER_SITE_OTHER): Payer: Medicare HMO

## 2019-01-13 ENCOUNTER — Other Ambulatory Visit: Payer: Self-pay | Admitting: Podiatry

## 2019-01-13 ENCOUNTER — Other Ambulatory Visit: Payer: Self-pay

## 2019-01-13 DIAGNOSIS — I82409 Acute embolism and thrombosis of unspecified deep veins of unspecified lower extremity: Secondary | ICD-10-CM | POA: Insufficient documentation

## 2019-01-13 DIAGNOSIS — M722 Plantar fascial fibromatosis: Secondary | ICD-10-CM | POA: Diagnosis not present

## 2019-01-13 DIAGNOSIS — M7661 Achilles tendinitis, right leg: Secondary | ICD-10-CM | POA: Diagnosis not present

## 2019-01-13 DIAGNOSIS — N952 Postmenopausal atrophic vaginitis: Secondary | ICD-10-CM | POA: Insufficient documentation

## 2019-01-13 DIAGNOSIS — M7662 Achilles tendinitis, left leg: Secondary | ICD-10-CM

## 2019-01-13 DIAGNOSIS — N3941 Urge incontinence: Secondary | ICD-10-CM | POA: Insufficient documentation

## 2019-01-13 DIAGNOSIS — O24419 Gestational diabetes mellitus in pregnancy, unspecified control: Secondary | ICD-10-CM | POA: Insufficient documentation

## 2019-01-13 DIAGNOSIS — F329 Major depressive disorder, single episode, unspecified: Secondary | ICD-10-CM | POA: Insufficient documentation

## 2019-01-13 DIAGNOSIS — M79672 Pain in left foot: Secondary | ICD-10-CM

## 2019-01-13 DIAGNOSIS — F32A Depression, unspecified: Secondary | ICD-10-CM | POA: Insufficient documentation

## 2019-01-13 DIAGNOSIS — M797 Fibromyalgia: Secondary | ICD-10-CM | POA: Insufficient documentation

## 2019-01-13 DIAGNOSIS — M1990 Unspecified osteoarthritis, unspecified site: Secondary | ICD-10-CM | POA: Insufficient documentation

## 2019-01-13 DIAGNOSIS — M79671 Pain in right foot: Secondary | ICD-10-CM

## 2019-01-13 DIAGNOSIS — M5416 Radiculopathy, lumbar region: Secondary | ICD-10-CM

## 2019-01-13 DIAGNOSIS — N393 Stress incontinence (female) (male): Secondary | ICD-10-CM | POA: Insufficient documentation

## 2019-01-13 NOTE — Patient Instructions (Signed)
Powerstep Insoles
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
## 2019-01-30 DIAGNOSIS — Z20828 Contact with and (suspected) exposure to other viral communicable diseases: Secondary | ICD-10-CM | POA: Diagnosis not present

## 2019-02-05 DIAGNOSIS — G894 Chronic pain syndrome: Secondary | ICD-10-CM | POA: Diagnosis not present

## 2019-02-05 DIAGNOSIS — R208 Other disturbances of skin sensation: Secondary | ICD-10-CM | POA: Diagnosis not present

## 2019-02-05 DIAGNOSIS — M47816 Spondylosis without myelopathy or radiculopathy, lumbar region: Secondary | ICD-10-CM | POA: Diagnosis not present

## 2019-02-07 DIAGNOSIS — G894 Chronic pain syndrome: Secondary | ICD-10-CM | POA: Diagnosis not present

## 2019-02-10 DIAGNOSIS — E782 Mixed hyperlipidemia: Secondary | ICD-10-CM | POA: Diagnosis not present

## 2019-02-10 DIAGNOSIS — I10 Essential (primary) hypertension: Secondary | ICD-10-CM | POA: Diagnosis not present

## 2019-02-18 DIAGNOSIS — M47816 Spondylosis without myelopathy or radiculopathy, lumbar region: Secondary | ICD-10-CM | POA: Diagnosis not present

## 2019-02-18 DIAGNOSIS — M5136 Other intervertebral disc degeneration, lumbar region: Secondary | ICD-10-CM | POA: Diagnosis not present

## 2019-02-24 DIAGNOSIS — J029 Acute pharyngitis, unspecified: Secondary | ICD-10-CM | POA: Diagnosis not present

## 2019-02-24 DIAGNOSIS — Z20828 Contact with and (suspected) exposure to other viral communicable diseases: Secondary | ICD-10-CM | POA: Diagnosis not present

## 2019-03-17 DIAGNOSIS — H1045 Other chronic allergic conjunctivitis: Secondary | ICD-10-CM | POA: Diagnosis not present

## 2019-03-17 DIAGNOSIS — J4540 Moderate persistent asthma, uncomplicated: Secondary | ICD-10-CM | POA: Diagnosis not present

## 2019-03-17 DIAGNOSIS — J300 Vasomotor rhinitis: Secondary | ICD-10-CM | POA: Diagnosis not present

## 2019-03-19 DIAGNOSIS — M47816 Spondylosis without myelopathy or radiculopathy, lumbar region: Secondary | ICD-10-CM | POA: Diagnosis not present

## 2019-03-19 DIAGNOSIS — M797 Fibromyalgia: Secondary | ICD-10-CM | POA: Diagnosis not present

## 2019-03-19 DIAGNOSIS — R946 Abnormal results of thyroid function studies: Secondary | ICD-10-CM | POA: Diagnosis not present

## 2019-03-19 DIAGNOSIS — M7918 Myalgia, other site: Secondary | ICD-10-CM | POA: Diagnosis not present

## 2019-03-19 DIAGNOSIS — K219 Gastro-esophageal reflux disease without esophagitis: Secondary | ICD-10-CM | POA: Diagnosis not present

## 2019-03-19 DIAGNOSIS — E782 Mixed hyperlipidemia: Secondary | ICD-10-CM | POA: Diagnosis not present

## 2019-03-19 DIAGNOSIS — R7301 Impaired fasting glucose: Secondary | ICD-10-CM | POA: Diagnosis not present

## 2019-03-19 DIAGNOSIS — I10 Essential (primary) hypertension: Secondary | ICD-10-CM | POA: Diagnosis not present

## 2019-03-19 DIAGNOSIS — M5136 Other intervertebral disc degeneration, lumbar region: Secondary | ICD-10-CM | POA: Diagnosis not present

## 2019-03-19 DIAGNOSIS — M4722 Other spondylosis with radiculopathy, cervical region: Secondary | ICD-10-CM | POA: Diagnosis not present

## 2019-03-19 DIAGNOSIS — M5030 Other cervical disc degeneration, unspecified cervical region: Secondary | ICD-10-CM | POA: Diagnosis not present

## 2019-03-19 DIAGNOSIS — G8929 Other chronic pain: Secondary | ICD-10-CM | POA: Diagnosis not present

## 2019-03-19 DIAGNOSIS — G894 Chronic pain syndrome: Secondary | ICD-10-CM | POA: Diagnosis not present
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
## 2019-03-24 DIAGNOSIS — Z23 Encounter for immunization: Secondary | ICD-10-CM | POA: Diagnosis not present

## 2019-03-24 DIAGNOSIS — Z0001 Encounter for general adult medical examination with abnormal findings: Secondary | ICD-10-CM | POA: Diagnosis not present

## 2019-03-24 DIAGNOSIS — I10 Essential (primary) hypertension: Secondary | ICD-10-CM | POA: Diagnosis not present

## 2019-03-24 DIAGNOSIS — Z1212 Encounter for screening for malignant neoplasm of rectum: Secondary | ICD-10-CM | POA: Diagnosis not present

## 2019-03-24 DIAGNOSIS — I700 Atherosclerosis of aorta: Secondary | ICD-10-CM | POA: Diagnosis not present

## 2019-03-24 DIAGNOSIS — R7301 Impaired fasting glucose: Secondary | ICD-10-CM | POA: Diagnosis not present

## 2019-03-24 DIAGNOSIS — F1190 Opioid use, unspecified, uncomplicated: Secondary | ICD-10-CM | POA: Diagnosis not present

## 2019-03-24 DIAGNOSIS — J45909 Unspecified asthma, uncomplicated: Secondary | ICD-10-CM | POA: Diagnosis not present

## 2019-04-03 ENCOUNTER — Ambulatory Visit: Payer: Medicare HMO | Attending: Internal Medicine

## 2019-04-03 DIAGNOSIS — Z23 Encounter for immunization: Secondary | ICD-10-CM | POA: Insufficient documentation

## 2019-04-03 NOTE — Progress Notes (Signed)
   Covid-19 Vaccination Clinic  Name:  Theresa Lucas    MRN: [id number] DOB: Nov 17, 1960  04/03/2019  Theresa Lucas was observed post Covid-19 immunization for 15 minutes without incidence. She was provided with Vaccine Information Sheet and instruction to access the V-Safe system.   Theresa Lucas was instructed to call 911 with any severe reactions post vaccine: Marland Kitchen Difficulty breathing  . Swelling of your face and throat  . A fast heartbeat  . A bad rash all over your body  . Dizziness and weakness    Immunizations Administered    Name Date Dose VIS Date Route   Pfizer COVID-19 Vaccine 04/03/2019  5:42 PM 0.3 mL 02/21/2019 Intramuscular   Manufacturer: [place]   Lot: [id number]   [place]: [id number]

## 2019-04-11 DIAGNOSIS — F419 Anxiety disorder, unspecified: Secondary | ICD-10-CM | POA: Diagnosis not present

## 2019-04-11 DIAGNOSIS — I10 Essential (primary) hypertension: Secondary | ICD-10-CM | POA: Diagnosis not present

## 2019-04-11 DIAGNOSIS — E7849 Other hyperlipidemia: Secondary | ICD-10-CM | POA: Diagnosis not present

## 2019-04-24 ENCOUNTER — Ambulatory Visit: Payer: Medicare HMO | Attending: Internal Medicine

## 2019-04-24 DIAGNOSIS — Z23 Encounter for immunization: Secondary | ICD-10-CM | POA: Insufficient documentation

## 2019-04-24 NOTE — Progress Notes (Signed)
   Covid-19 Vaccination Clinic  Name:  Theresa Lucas    MRN: [id number] DOB: 03-May-1960  04/24/2019  Ms. [name] was observed post Covid-19 immunization for 15 minutes without incidence. She was provided with Vaccine Information Sheet and instruction to access the V-Safe system.   Ms. [name] was instructed to call 911 with any severe reactions post vaccine: Marland Kitchen Difficulty breathing  . Swelling of your face and throat  . A fast heartbeat  . A bad rash all over your body  . Dizziness and weakness    Immunizations Administered    Name Date Dose VIS Date Route   Pfizer COVID-19 Vaccine 04/24/2019  8:34 AM 0.3 mL 02/21/2019 Intramuscular   Manufacturer: [place]   Lot: [id number]   [place]: [id number]

## 2019-05-09 DIAGNOSIS — I10 Essential (primary) hypertension: Secondary | ICD-10-CM | POA: Diagnosis not present

## 2019-05-09 DIAGNOSIS — E7849 Other hyperlipidemia: Secondary | ICD-10-CM | POA: Diagnosis not present

## 2019-05-15 DIAGNOSIS — M722 Plantar fascial fibromatosis: Secondary | ICD-10-CM | POA: Diagnosis not present

## 2019-05-15 DIAGNOSIS — R52 Pain, unspecified: Secondary | ICD-10-CM | POA: Diagnosis not present

## 2019-06-11 DIAGNOSIS — F419 Anxiety disorder, unspecified: Secondary | ICD-10-CM | POA: Diagnosis not present

## 2019-06-11 DIAGNOSIS — I10 Essential (primary) hypertension: Secondary | ICD-10-CM | POA: Diagnosis not present

## 2019-06-16 DIAGNOSIS — J320 Chronic maxillary sinusitis: Secondary | ICD-10-CM | POA: Diagnosis not present

## 2019-06-16 DIAGNOSIS — J322 Chronic ethmoidal sinusitis: Secondary | ICD-10-CM | POA: Diagnosis not present

## 2019-06-16 DIAGNOSIS — J310 Chronic rhinitis: Secondary | ICD-10-CM | POA: Diagnosis not present

## 2019-06-19 DIAGNOSIS — M797 Fibromyalgia: Secondary | ICD-10-CM | POA: Diagnosis not present

## 2019-06-19 DIAGNOSIS — M47812 Spondylosis without myelopathy or radiculopathy, cervical region: Secondary | ICD-10-CM | POA: Diagnosis not present

## 2019-06-25 DIAGNOSIS — E782 Mixed hyperlipidemia: Secondary | ICD-10-CM | POA: Diagnosis not present

## 2019-06-25 DIAGNOSIS — I10 Essential (primary) hypertension: Secondary | ICD-10-CM | POA: Diagnosis not present

## 2019-06-25 DIAGNOSIS — R7301 Impaired fasting glucose: Secondary | ICD-10-CM | POA: Diagnosis not present

## 2019-06-25 DIAGNOSIS — M797 Fibromyalgia: Secondary | ICD-10-CM | POA: Diagnosis not present

## 2019-06-25 DIAGNOSIS — K2100 Gastro-esophageal reflux disease with esophagitis, without bleeding: Secondary | ICD-10-CM | POA: Diagnosis not present

## 2019-06-25 DIAGNOSIS — R739 Hyperglycemia, unspecified: Secondary | ICD-10-CM | POA: Diagnosis not present

## 2019-07-01 DIAGNOSIS — M5136 Other intervertebral disc degeneration, lumbar region: Secondary | ICD-10-CM | POA: Diagnosis not present

## 2019-07-01 DIAGNOSIS — M47812 Spondylosis without myelopathy or radiculopathy, cervical region: Secondary | ICD-10-CM | POA: Diagnosis not present

## 2019-07-01 DIAGNOSIS — J302 Other seasonal allergic rhinitis: Secondary | ICD-10-CM | POA: Diagnosis not present

## 2019-07-01 DIAGNOSIS — Z6841 Body Mass Index (BMI) 40.0 and over, adult: Secondary | ICD-10-CM | POA: Diagnosis not present

## 2019-07-01 DIAGNOSIS — I2699 Other pulmonary embolism without acute cor pulmonale: Secondary | ICD-10-CM | POA: Diagnosis not present

## 2019-07-01 DIAGNOSIS — R7301 Impaired fasting glucose: Secondary | ICD-10-CM | POA: Diagnosis not present

## 2019-07-01 DIAGNOSIS — F1190 Opioid use, unspecified, uncomplicated: Secondary | ICD-10-CM | POA: Diagnosis not present

## 2019-07-01 DIAGNOSIS — Z7901 Long term (current) use of anticoagulants: Secondary | ICD-10-CM | POA: Diagnosis not present

## 2019-07-01 DIAGNOSIS — I10 Essential (primary) hypertension: Secondary | ICD-10-CM | POA: Diagnosis not present

## 2019-07-01 DIAGNOSIS — K219 Gastro-esophageal reflux disease without esophagitis: Secondary | ICD-10-CM | POA: Diagnosis not present

## 2019-07-01 DIAGNOSIS — I82402 Acute embolism and thrombosis of unspecified deep veins of left lower extremity: Secondary | ICD-10-CM | POA: Diagnosis not present

## 2019-07-01 DIAGNOSIS — F331 Major depressive disorder, recurrent, moderate: Secondary | ICD-10-CM | POA: Diagnosis not present

## 2019-07-01 DIAGNOSIS — I871 Compression of vein: Secondary | ICD-10-CM | POA: Diagnosis not present

## 2019-07-01 DIAGNOSIS — E782 Mixed hyperlipidemia: Secondary | ICD-10-CM | POA: Diagnosis not present

## 2019-07-01 DIAGNOSIS — F329 Major depressive disorder, single episode, unspecified: Secondary | ICD-10-CM | POA: Diagnosis not present

## 2019-07-01 DIAGNOSIS — J45909 Unspecified asthma, uncomplicated: Secondary | ICD-10-CM | POA: Diagnosis not present

## 2019-07-01 DIAGNOSIS — I700 Atherosclerosis of aorta: Secondary | ICD-10-CM | POA: Diagnosis not present

## 2019-07-01 DIAGNOSIS — M797 Fibromyalgia: Secondary | ICD-10-CM | POA: Diagnosis not present

## 2019-07-04 DIAGNOSIS — M79672 Pain in left foot: Secondary | ICD-10-CM | POA: Diagnosis not present

## 2019-07-04 DIAGNOSIS — M7661 Achilles tendinitis, right leg: Secondary | ICD-10-CM | POA: Diagnosis not present

## 2019-07-04 DIAGNOSIS — M7751 Other enthesopathy of right foot: Secondary | ICD-10-CM | POA: Diagnosis not present

## 2019-07-11 DIAGNOSIS — M7751 Other enthesopathy of right foot: Secondary | ICD-10-CM | POA: Diagnosis not present

## 2019-07-14 DIAGNOSIS — M7661 Achilles tendinitis, right leg: Secondary | ICD-10-CM | POA: Diagnosis not present

## 2019-07-14 DIAGNOSIS — M7751 Other enthesopathy of right foot: Secondary | ICD-10-CM | POA: Diagnosis not present

## 2019-07-16 DIAGNOSIS — R072 Precordial pain: Secondary | ICD-10-CM | POA: Diagnosis not present

## 2019-07-16 DIAGNOSIS — R432 Parageusia: Secondary | ICD-10-CM | POA: Diagnosis not present

## 2019-07-16 DIAGNOSIS — R0789 Other chest pain: Secondary | ICD-10-CM | POA: Diagnosis not present

## 2019-07-16 DIAGNOSIS — B341 Enterovirus infection, unspecified: Secondary | ICD-10-CM | POA: Diagnosis not present

## 2019-07-16 DIAGNOSIS — R079 Chest pain, unspecified: Secondary | ICD-10-CM | POA: Diagnosis not present

## 2019-07-16 DIAGNOSIS — F3340 Major depressive disorder, recurrent, in remission, unspecified: Secondary | ICD-10-CM | POA: Diagnosis not present

## 2019-07-16 DIAGNOSIS — Z6841 Body Mass Index (BMI) 40.0 and over, adult: Secondary | ICD-10-CM | POA: Diagnosis not present

## 2019-07-16 DIAGNOSIS — R0602 Shortness of breath: Secondary | ICD-10-CM | POA: Diagnosis not present

## 2019-07-16 DIAGNOSIS — J301 Allergic rhinitis due to pollen: Secondary | ICD-10-CM | POA: Diagnosis not present

## 2019-07-16 DIAGNOSIS — I10 Essential (primary) hypertension: Secondary | ICD-10-CM | POA: Diagnosis not present

## 2019-07-16 DIAGNOSIS — Z20822 Contact with and (suspected) exposure to covid-19: Secondary | ICD-10-CM | POA: Diagnosis not present

## 2019-07-16 DIAGNOSIS — R0600 Dyspnea, unspecified: Secondary | ICD-10-CM | POA: Diagnosis not present

## 2019-07-16 DIAGNOSIS — H1010 Acute atopic conjunctivitis, unspecified eye: Secondary | ICD-10-CM | POA: Diagnosis not present

## 2019-07-17 DIAGNOSIS — I10 Essential (primary) hypertension: Secondary | ICD-10-CM | POA: Diagnosis not present

## 2019-07-17 DIAGNOSIS — R0789 Other chest pain: Secondary | ICD-10-CM | POA: Diagnosis not present

## 2019-07-17 DIAGNOSIS — R0609 Other forms of dyspnea: Secondary | ICD-10-CM | POA: Diagnosis not present

## 2019-07-17 DIAGNOSIS — R072 Precordial pain: Secondary | ICD-10-CM | POA: Diagnosis not present

## 2019-07-17 DIAGNOSIS — E042 Nontoxic multinodular goiter: Secondary | ICD-10-CM | POA: Diagnosis not present

## 2019-07-17 DIAGNOSIS — Z86711 Personal history of pulmonary embolism: Secondary | ICD-10-CM | POA: Diagnosis not present

## 2019-07-17 DIAGNOSIS — R0600 Dyspnea, unspecified: Secondary | ICD-10-CM | POA: Diagnosis not present

## 2019-07-18 DIAGNOSIS — R079 Chest pain, unspecified: Secondary | ICD-10-CM | POA: Diagnosis not present

## 2019-07-24 DIAGNOSIS — J4540 Moderate persistent asthma, uncomplicated: Secondary | ICD-10-CM | POA: Diagnosis not present

## 2019-07-24 DIAGNOSIS — R05 Cough: Secondary | ICD-10-CM | POA: Diagnosis not present

## 2019-07-25 DIAGNOSIS — M7751 Other enthesopathy of right foot: Secondary | ICD-10-CM | POA: Diagnosis not present

## 2019-07-25 DIAGNOSIS — M79672 Pain in left foot: Secondary | ICD-10-CM | POA: Diagnosis not present

## 2019-07-25 DIAGNOSIS — M7661 Achilles tendinitis, right leg: Secondary | ICD-10-CM | POA: Diagnosis not present

## 2019-08-04 DIAGNOSIS — Z5181 Encounter for therapeutic drug level monitoring: Secondary | ICD-10-CM | POA: Diagnosis not present

## 2019-08-04 DIAGNOSIS — M797 Fibromyalgia: Secondary | ICD-10-CM | POA: Diagnosis not present

## 2019-08-04 DIAGNOSIS — M47816 Spondylosis without myelopathy or radiculopathy, lumbar region: Secondary | ICD-10-CM | POA: Diagnosis not present

## 2019-08-04 DIAGNOSIS — Z79891 Long term (current) use of opiate analgesic: Secondary | ICD-10-CM | POA: Diagnosis not present

## 2019-08-07 DIAGNOSIS — M79672 Pain in left foot: Secondary | ICD-10-CM | POA: Diagnosis not present

## 2019-08-07 DIAGNOSIS — S92352A Displaced fracture of fifth metatarsal bone, left foot, initial encounter for closed fracture: Secondary | ICD-10-CM | POA: Diagnosis not present

## 2019-08-07 DIAGNOSIS — S96912A Strain of unspecified muscle and tendon at ankle and foot level, left foot, initial encounter: Secondary | ICD-10-CM | POA: Diagnosis not present

## 2019-08-08 DIAGNOSIS — M7661 Achilles tendinitis, right leg: Secondary | ICD-10-CM | POA: Diagnosis not present

## 2019-08-08 DIAGNOSIS — E041 Nontoxic single thyroid nodule: Secondary | ICD-10-CM | POA: Diagnosis not present

## 2019-08-08 DIAGNOSIS — E042 Nontoxic multinodular goiter: Secondary | ICD-10-CM | POA: Diagnosis not present

## 2019-08-08 DIAGNOSIS — S92302A Fracture of unspecified metatarsal bone(s), left foot, initial encounter for closed fracture: Secondary | ICD-10-CM | POA: Diagnosis not present

## 2019-08-08 DIAGNOSIS — M7751 Other enthesopathy of right foot: Secondary | ICD-10-CM | POA: Diagnosis not present

## 2019-08-08 DIAGNOSIS — M79672 Pain in left foot: Secondary | ICD-10-CM | POA: Diagnosis not present

## 2019-08-11 DIAGNOSIS — I10 Essential (primary) hypertension: Secondary | ICD-10-CM | POA: Diagnosis not present

## 2019-08-11 DIAGNOSIS — K219 Gastro-esophageal reflux disease without esophagitis: Secondary | ICD-10-CM | POA: Diagnosis not present

## 2019-08-11 DIAGNOSIS — E7849 Other hyperlipidemia: Secondary | ICD-10-CM | POA: Diagnosis not present

## 2019-08-17 DIAGNOSIS — Z6841 Body Mass Index (BMI) 40.0 and over, adult: Secondary | ICD-10-CM | POA: Diagnosis not present

## 2019-08-17 DIAGNOSIS — E041 Nontoxic single thyroid nodule: Secondary | ICD-10-CM | POA: Diagnosis not present

## 2019-08-17 DIAGNOSIS — J4540 Moderate persistent asthma, uncomplicated: Secondary | ICD-10-CM | POA: Diagnosis not present

## 2019-08-17 DIAGNOSIS — R05 Cough: Secondary | ICD-10-CM | POA: Diagnosis not present

## 2019-08-19 DIAGNOSIS — Z91040 Latex allergy status: Secondary | ICD-10-CM | POA: Diagnosis not present

## 2019-08-19 DIAGNOSIS — M47816 Spondylosis without myelopathy or radiculopathy, lumbar region: Secondary | ICD-10-CM | POA: Diagnosis not present

## 2019-08-19 DIAGNOSIS — Z79899 Other long term (current) drug therapy: Secondary | ICD-10-CM | POA: Diagnosis not present

## 2019-08-19 DIAGNOSIS — Z888 Allergy status to other drugs, medicaments and biological substances status: Secondary | ICD-10-CM | POA: Diagnosis not present

## 2019-09-02 DIAGNOSIS — E041 Nontoxic single thyroid nodule: Secondary | ICD-10-CM | POA: Diagnosis not present

## 2019-09-19 DIAGNOSIS — Z7901 Long term (current) use of anticoagulants: Secondary | ICD-10-CM | POA: Diagnosis not present

## 2019-09-19 DIAGNOSIS — M79672 Pain in left foot: Secondary | ICD-10-CM | POA: Diagnosis not present

## 2019-09-19 DIAGNOSIS — S92302G Fracture of unspecified metatarsal bone(s), left foot, subsequent encounter for fracture with delayed healing: Secondary | ICD-10-CM | POA: Diagnosis not present

## 2019-09-23 DIAGNOSIS — S92302G Fracture of unspecified metatarsal bone(s), left foot, subsequent encounter for fracture with delayed healing: Secondary | ICD-10-CM | POA: Diagnosis not present

## 2019-09-29 DIAGNOSIS — H1045 Other chronic allergic conjunctivitis: Secondary | ICD-10-CM | POA: Diagnosis not present

## 2019-09-29 DIAGNOSIS — J4540 Moderate persistent asthma, uncomplicated: Secondary | ICD-10-CM | POA: Diagnosis not present

## 2019-09-29 DIAGNOSIS — J300 Vasomotor rhinitis: Secondary | ICD-10-CM | POA: Diagnosis not present

## 2019-10-01 DIAGNOSIS — M79672 Pain in left foot: Secondary | ICD-10-CM | POA: Diagnosis not present

## 2019-10-01 DIAGNOSIS — R29898 Other symptoms and signs involving the musculoskeletal system: Secondary | ICD-10-CM | POA: Diagnosis not present

## 2019-10-01 DIAGNOSIS — S92302G Fracture of unspecified metatarsal bone(s), left foot, subsequent encounter for fracture with delayed healing: Secondary | ICD-10-CM | POA: Diagnosis not present

## 2019-10-01 DIAGNOSIS — M722 Plantar fascial fibromatosis: Secondary | ICD-10-CM | POA: Diagnosis not present

## 2019-10-07 DIAGNOSIS — M47816 Spondylosis without myelopathy or radiculopathy, lumbar region: Secondary | ICD-10-CM | POA: Diagnosis not present

## 2019-10-07 DIAGNOSIS — M47812 Spondylosis without myelopathy or radiculopathy, cervical region: Secondary | ICD-10-CM | POA: Diagnosis not present

## 2019-10-07 DIAGNOSIS — G894 Chronic pain syndrome: Secondary | ICD-10-CM | POA: Diagnosis not present

## 2019-10-07 DIAGNOSIS — M797 Fibromyalgia: Secondary | ICD-10-CM | POA: Diagnosis not present

## 2019-10-09 DIAGNOSIS — M722 Plantar fascial fibromatosis: Secondary | ICD-10-CM | POA: Diagnosis not present

## 2019-10-09 DIAGNOSIS — M79672 Pain in left foot: Secondary | ICD-10-CM | POA: Diagnosis not present

## 2019-10-09 DIAGNOSIS — R29898 Other symptoms and signs involving the musculoskeletal system: Secondary | ICD-10-CM | POA: Diagnosis not present

## 2019-10-09 DIAGNOSIS — S92302G Fracture of unspecified metatarsal bone(s), left foot, subsequent encounter for fracture with delayed healing: Secondary | ICD-10-CM | POA: Diagnosis not present

## 2019-10-10 DIAGNOSIS — E7849 Other hyperlipidemia: Secondary | ICD-10-CM | POA: Diagnosis not present

## 2019-10-10 DIAGNOSIS — K219 Gastro-esophageal reflux disease without esophagitis: Secondary | ICD-10-CM | POA: Diagnosis not present

## 2019-10-10 DIAGNOSIS — I10 Essential (primary) hypertension: Secondary | ICD-10-CM | POA: Diagnosis not present

## 2019-10-13 DIAGNOSIS — S92302G Fracture of unspecified metatarsal bone(s), left foot, subsequent encounter for fracture with delayed healing: Secondary | ICD-10-CM | POA: Diagnosis not present

## 2019-10-15 DIAGNOSIS — S92302G Fracture of unspecified metatarsal bone(s), left foot, subsequent encounter for fracture with delayed healing: Secondary | ICD-10-CM | POA: Diagnosis not present

## 2019-10-22 DIAGNOSIS — S92302G Fracture of unspecified metatarsal bone(s), left foot, subsequent encounter for fracture with delayed healing: Secondary | ICD-10-CM | POA: Diagnosis not present

## 2019-10-24 DIAGNOSIS — Z20822 Contact with and (suspected) exposure to covid-19: Secondary | ICD-10-CM | POA: Diagnosis not present

## 2019-10-27 DIAGNOSIS — R05 Cough: Secondary | ICD-10-CM | POA: Diagnosis not present

## 2019-10-27 DIAGNOSIS — Z20828 Contact with and (suspected) exposure to other viral communicable diseases: Secondary | ICD-10-CM | POA: Diagnosis not present

## 2019-10-28 DIAGNOSIS — S92302G Fracture of unspecified metatarsal bone(s), left foot, subsequent encounter for fracture with delayed healing: Secondary | ICD-10-CM | POA: Diagnosis not present

## 2019-10-29 DIAGNOSIS — E041 Nontoxic single thyroid nodule: Secondary | ICD-10-CM | POA: Diagnosis not present

## 2019-10-29 DIAGNOSIS — E782 Mixed hyperlipidemia: Secondary | ICD-10-CM | POA: Diagnosis not present

## 2019-10-29 DIAGNOSIS — R946 Abnormal results of thyroid function studies: Secondary | ICD-10-CM | POA: Diagnosis not present

## 2019-10-29 DIAGNOSIS — I10 Essential (primary) hypertension: Secondary | ICD-10-CM | POA: Diagnosis not present

## 2019-10-29 DIAGNOSIS — K219 Gastro-esophageal reflux disease without esophagitis: Secondary | ICD-10-CM | POA: Diagnosis not present

## 2019-10-29 DIAGNOSIS — R7301 Impaired fasting glucose: Secondary | ICD-10-CM | POA: Diagnosis not present

## 2019-10-31 DIAGNOSIS — I10 Essential (primary) hypertension: Secondary | ICD-10-CM | POA: Diagnosis not present

## 2019-10-31 DIAGNOSIS — E782 Mixed hyperlipidemia: Secondary | ICD-10-CM | POA: Diagnosis not present

## 2019-10-31 DIAGNOSIS — J45909 Unspecified asthma, uncomplicated: Secondary | ICD-10-CM | POA: Diagnosis not present

## 2019-10-31 DIAGNOSIS — F1190 Opioid use, unspecified, uncomplicated: Secondary | ICD-10-CM | POA: Diagnosis not present

## 2019-10-31 DIAGNOSIS — R7301 Impaired fasting glucose: Secondary | ICD-10-CM | POA: Diagnosis not present

## 2019-10-31 DIAGNOSIS — K219 Gastro-esophageal reflux disease without esophagitis: Secondary | ICD-10-CM | POA: Diagnosis not present

## 2019-10-31 DIAGNOSIS — I700 Atherosclerosis of aorta: Secondary | ICD-10-CM | POA: Diagnosis not present

## 2019-10-31 DIAGNOSIS — Z6841 Body Mass Index (BMI) 40.0 and over, adult: Secondary | ICD-10-CM | POA: Diagnosis not present

## 2019-11-04 DIAGNOSIS — M79672 Pain in left foot: Secondary | ICD-10-CM | POA: Diagnosis not present

## 2019-11-04 DIAGNOSIS — M7751 Other enthesopathy of right foot: Secondary | ICD-10-CM | POA: Diagnosis not present

## 2019-11-04 DIAGNOSIS — M7661 Achilles tendinitis, right leg: Secondary | ICD-10-CM | POA: Diagnosis not present

## 2019-11-04 DIAGNOSIS — M722 Plantar fascial fibromatosis: Secondary | ICD-10-CM | POA: Diagnosis not present

## 2019-11-04 DIAGNOSIS — S92302G Fracture of unspecified metatarsal bone(s), left foot, subsequent encounter for fracture with delayed healing: Secondary | ICD-10-CM | POA: Diagnosis not present

## 2019-12-03 DIAGNOSIS — G894 Chronic pain syndrome: Secondary | ICD-10-CM | POA: Diagnosis not present

## 2019-12-03 DIAGNOSIS — M797 Fibromyalgia: Secondary | ICD-10-CM | POA: Diagnosis not present

## 2019-12-03 DIAGNOSIS — M47812 Spondylosis without myelopathy or radiculopathy, cervical region: Secondary | ICD-10-CM | POA: Diagnosis not present

## 2019-12-03 DIAGNOSIS — M7918 Myalgia, other site: Secondary | ICD-10-CM | POA: Diagnosis not present

## 2019-12-03 DIAGNOSIS — G8929 Other chronic pain: Secondary | ICD-10-CM | POA: Diagnosis not present

## 2019-12-11 DIAGNOSIS — K219 Gastro-esophageal reflux disease without esophagitis: Secondary | ICD-10-CM | POA: Diagnosis not present

## 2019-12-11 DIAGNOSIS — E7849 Other hyperlipidemia: Secondary | ICD-10-CM | POA: Diagnosis not present

## 2019-12-11 DIAGNOSIS — I10 Essential (primary) hypertension: Secondary | ICD-10-CM | POA: Diagnosis not present

## 2019-12-15 DIAGNOSIS — J310 Chronic rhinitis: Secondary | ICD-10-CM | POA: Diagnosis not present

## 2019-12-15 DIAGNOSIS — J320 Chronic maxillary sinusitis: Secondary | ICD-10-CM | POA: Diagnosis not present

## 2019-12-15 DIAGNOSIS — J322 Chronic ethmoidal sinusitis: Secondary | ICD-10-CM | POA: Diagnosis not present

## 2019-12-20 DIAGNOSIS — G4733 Obstructive sleep apnea (adult) (pediatric): Secondary | ICD-10-CM | POA: Diagnosis not present

## 2019-12-23 DIAGNOSIS — M47812 Spondylosis without myelopathy or radiculopathy, cervical region: Secondary | ICD-10-CM | POA: Diagnosis not present

## 2019-12-23 DIAGNOSIS — R29898 Other symptoms and signs involving the musculoskeletal system: Secondary | ICD-10-CM | POA: Diagnosis not present

## 2019-12-23 DIAGNOSIS — M5382 Other specified dorsopathies, cervical region: Secondary | ICD-10-CM | POA: Diagnosis not present

## 2019-12-23 DIAGNOSIS — M542 Cervicalgia: Secondary | ICD-10-CM | POA: Diagnosis not present

## 2019-12-23 DIAGNOSIS — M797 Fibromyalgia: Secondary | ICD-10-CM | POA: Diagnosis not present

## 2019-12-23 DIAGNOSIS — M6281 Muscle weakness (generalized): Secondary | ICD-10-CM | POA: Diagnosis not present

## 2019-12-30 DIAGNOSIS — Z79899 Other long term (current) drug therapy: Secondary | ICD-10-CM | POA: Diagnosis not present

## 2019-12-30 DIAGNOSIS — M1990 Unspecified osteoarthritis, unspecified site: Secondary | ICD-10-CM | POA: Diagnosis not present

## 2019-12-30 DIAGNOSIS — M47812 Spondylosis without myelopathy or radiculopathy, cervical region: Secondary | ICD-10-CM | POA: Diagnosis not present

## 2019-12-30 DIAGNOSIS — M47816 Spondylosis without myelopathy or radiculopathy, lumbar region: Secondary | ICD-10-CM | POA: Diagnosis not present

## 2019-12-30 DIAGNOSIS — Z7951 Long term (current) use of inhaled steroids: Secondary | ICD-10-CM | POA: Diagnosis not present

## 2019-12-30 DIAGNOSIS — M797 Fibromyalgia: Secondary | ICD-10-CM | POA: Diagnosis not present

## 2019-12-30 DIAGNOSIS — J45909 Unspecified asthma, uncomplicated: Secondary | ICD-10-CM | POA: Diagnosis not present

## 2019-12-30 DIAGNOSIS — M4802 Spinal stenosis, cervical region: Secondary | ICD-10-CM | POA: Diagnosis not present

## 2019-12-30 DIAGNOSIS — K219 Gastro-esophageal reflux disease without esophagitis: Secondary | ICD-10-CM | POA: Diagnosis not present

## 2020-01-06 DIAGNOSIS — M7751 Other enthesopathy of right foot: Secondary | ICD-10-CM | POA: Diagnosis not present

## 2020-01-06 DIAGNOSIS — M7661 Achilles tendinitis, right leg: Secondary | ICD-10-CM | POA: Diagnosis not present

## 2020-01-06 DIAGNOSIS — S92302G Fracture of unspecified metatarsal bone(s), left foot, subsequent encounter for fracture with delayed healing: Secondary | ICD-10-CM | POA: Diagnosis not present

## 2020-01-06 DIAGNOSIS — M79672 Pain in left foot: Secondary | ICD-10-CM | POA: Diagnosis not present

## 2020-01-07 DIAGNOSIS — M797 Fibromyalgia: Secondary | ICD-10-CM | POA: Diagnosis not present

## 2020-01-07 DIAGNOSIS — M47812 Spondylosis without myelopathy or radiculopathy, cervical region: Secondary | ICD-10-CM | POA: Diagnosis not present

## 2020-01-07 DIAGNOSIS — R29898 Other symptoms and signs involving the musculoskeletal system: Secondary | ICD-10-CM | POA: Diagnosis not present

## 2020-01-10 DIAGNOSIS — K219 Gastro-esophageal reflux disease without esophagitis: Secondary | ICD-10-CM | POA: Diagnosis not present

## 2020-01-10 DIAGNOSIS — I10 Essential (primary) hypertension: Secondary | ICD-10-CM | POA: Diagnosis not present

## 2020-01-10 DIAGNOSIS — E7849 Other hyperlipidemia: Secondary | ICD-10-CM | POA: Diagnosis not present

## 2020-01-14 DIAGNOSIS — M47812 Spondylosis without myelopathy or radiculopathy, cervical region: Secondary | ICD-10-CM | POA: Diagnosis not present

## 2020-01-15 DIAGNOSIS — M79672 Pain in left foot: Secondary | ICD-10-CM | POA: Diagnosis not present

## 2020-01-15 DIAGNOSIS — M792 Neuralgia and neuritis, unspecified: Secondary | ICD-10-CM | POA: Diagnosis not present

## 2020-01-15 DIAGNOSIS — S92302G Fracture of unspecified metatarsal bone(s), left foot, subsequent encounter for fracture with delayed healing: Secondary | ICD-10-CM | POA: Diagnosis not present

## 2020-01-22 DIAGNOSIS — G8929 Other chronic pain: Secondary | ICD-10-CM | POA: Diagnosis not present

## 2020-01-22 DIAGNOSIS — M797 Fibromyalgia: Secondary | ICD-10-CM | POA: Diagnosis not present

## 2020-01-22 DIAGNOSIS — M47812 Spondylosis without myelopathy or radiculopathy, cervical region: Secondary | ICD-10-CM | POA: Diagnosis not present

## 2020-01-27 DIAGNOSIS — M47812 Spondylosis without myelopathy or radiculopathy, cervical region: Secondary | ICD-10-CM | POA: Diagnosis not present

## 2020-01-27 DIAGNOSIS — M797 Fibromyalgia: Secondary | ICD-10-CM | POA: Diagnosis not present

## 2020-01-27 DIAGNOSIS — G8929 Other chronic pain: Secondary | ICD-10-CM | POA: Diagnosis not present

## 2020-02-03 DIAGNOSIS — G8929 Other chronic pain: Secondary | ICD-10-CM | POA: Diagnosis not present

## 2020-02-03 DIAGNOSIS — M797 Fibromyalgia: Secondary | ICD-10-CM | POA: Diagnosis not present

## 2020-02-03 DIAGNOSIS — M47812 Spondylosis without myelopathy or radiculopathy, cervical region: Secondary | ICD-10-CM | POA: Diagnosis not present

## 2020-02-06 DIAGNOSIS — Z0001 Encounter for general adult medical examination with abnormal findings: Secondary | ICD-10-CM | POA: Diagnosis not present

## 2020-02-06 DIAGNOSIS — E782 Mixed hyperlipidemia: Secondary | ICD-10-CM | POA: Diagnosis not present

## 2020-02-06 DIAGNOSIS — K2100 Gastro-esophageal reflux disease with esophagitis, without bleeding: Secondary | ICD-10-CM | POA: Diagnosis not present

## 2020-02-06 DIAGNOSIS — I10 Essential (primary) hypertension: Secondary | ICD-10-CM | POA: Diagnosis not present

## 2020-02-06 DIAGNOSIS — Z1329 Encounter for screening for other suspected endocrine disorder: Secondary | ICD-10-CM | POA: Diagnosis not present

## 2020-02-06 DIAGNOSIS — R7301 Impaired fasting glucose: Secondary | ICD-10-CM | POA: Diagnosis not present

## 2020-02-10 DIAGNOSIS — F1190 Opioid use, unspecified, uncomplicated: Secondary | ICD-10-CM | POA: Diagnosis not present

## 2020-02-10 DIAGNOSIS — K219 Gastro-esophageal reflux disease without esophagitis: Secondary | ICD-10-CM | POA: Diagnosis not present

## 2020-02-10 DIAGNOSIS — I2699 Other pulmonary embolism without acute cor pulmonale: Secondary | ICD-10-CM | POA: Diagnosis not present

## 2020-02-10 DIAGNOSIS — I700 Atherosclerosis of aorta: Secondary | ICD-10-CM | POA: Diagnosis not present

## 2020-02-10 DIAGNOSIS — Z23 Encounter for immunization: Secondary | ICD-10-CM | POA: Diagnosis not present

## 2020-02-10 DIAGNOSIS — F331 Major depressive disorder, recurrent, moderate: Secondary | ICD-10-CM | POA: Diagnosis not present

## 2020-02-10 DIAGNOSIS — E7849 Other hyperlipidemia: Secondary | ICD-10-CM | POA: Diagnosis not present

## 2020-02-10 DIAGNOSIS — J45909 Unspecified asthma, uncomplicated: Secondary | ICD-10-CM | POA: Diagnosis not present

## 2020-02-10 DIAGNOSIS — I10 Essential (primary) hypertension: Secondary | ICD-10-CM | POA: Diagnosis not present

## 2020-02-10 DIAGNOSIS — E782 Mixed hyperlipidemia: Secondary | ICD-10-CM | POA: Diagnosis not present

## 2020-02-11 DIAGNOSIS — M47812 Spondylosis without myelopathy or radiculopathy, cervical region: Secondary | ICD-10-CM | POA: Diagnosis not present

## 2020-02-11 DIAGNOSIS — M542 Cervicalgia: Secondary | ICD-10-CM | POA: Diagnosis not present

## 2020-02-11 DIAGNOSIS — G8929 Other chronic pain: Secondary | ICD-10-CM | POA: Diagnosis not present

## 2020-02-11 DIAGNOSIS — M797 Fibromyalgia: Secondary | ICD-10-CM | POA: Diagnosis not present

## 2020-02-11 DIAGNOSIS — M436 Torticollis: Secondary | ICD-10-CM | POA: Diagnosis not present

## 2020-02-11 DIAGNOSIS — Z9889 Other specified postprocedural states: Secondary | ICD-10-CM | POA: Diagnosis not present

## 2020-02-11 DIAGNOSIS — R519 Headache, unspecified: Secondary | ICD-10-CM | POA: Diagnosis not present

## 2020-02-16 DIAGNOSIS — M79672 Pain in left foot: Secondary | ICD-10-CM | POA: Diagnosis not present

## 2020-02-23 DIAGNOSIS — M79672 Pain in left foot: Secondary | ICD-10-CM | POA: Diagnosis not present

## 2020-02-24 DIAGNOSIS — J45909 Unspecified asthma, uncomplicated: Secondary | ICD-10-CM | POA: Diagnosis not present

## 2020-02-24 DIAGNOSIS — F329 Major depressive disorder, single episode, unspecified: Secondary | ICD-10-CM | POA: Diagnosis not present

## 2020-02-24 DIAGNOSIS — Z86718 Personal history of other venous thrombosis and embolism: Secondary | ICD-10-CM | POA: Diagnosis not present

## 2020-02-24 DIAGNOSIS — M47816 Spondylosis without myelopathy or radiculopathy, lumbar region: Secondary | ICD-10-CM | POA: Diagnosis not present

## 2020-02-24 DIAGNOSIS — I10 Essential (primary) hypertension: Secondary | ICD-10-CM | POA: Diagnosis not present

## 2020-02-24 DIAGNOSIS — I871 Compression of vein: Secondary | ICD-10-CM | POA: Diagnosis not present

## 2020-02-24 DIAGNOSIS — M797 Fibromyalgia: Secondary | ICD-10-CM | POA: Diagnosis not present

## 2020-02-24 DIAGNOSIS — R269 Unspecified abnormalities of gait and mobility: Secondary | ICD-10-CM | POA: Diagnosis not present

## 2020-02-24 DIAGNOSIS — M47812 Spondylosis without myelopathy or radiculopathy, cervical region: Secondary | ICD-10-CM | POA: Diagnosis not present

## 2020-02-24 DIAGNOSIS — M4802 Spinal stenosis, cervical region: Secondary | ICD-10-CM | POA: Diagnosis not present

## 2020-02-26 DIAGNOSIS — M792 Neuralgia and neuritis, unspecified: Secondary | ICD-10-CM | POA: Diagnosis not present

## 2020-03-01 DIAGNOSIS — H5213 Myopia, bilateral: Secondary | ICD-10-CM | POA: Diagnosis not present

## 2020-03-01 DIAGNOSIS — H52209 Unspecified astigmatism, unspecified eye: Secondary | ICD-10-CM | POA: Diagnosis not present

## 2020-03-01 DIAGNOSIS — H524 Presbyopia: Secondary | ICD-10-CM | POA: Diagnosis not present

## 2020-03-02 DIAGNOSIS — Z96653 Presence of artificial knee joint, bilateral: Secondary | ICD-10-CM | POA: Diagnosis not present

## 2020-03-02 DIAGNOSIS — M25671 Stiffness of right ankle, not elsewhere classified: Secondary | ICD-10-CM | POA: Diagnosis not present

## 2020-03-02 DIAGNOSIS — M25672 Stiffness of left ankle, not elsewhere classified: Secondary | ICD-10-CM | POA: Diagnosis not present

## 2020-03-02 DIAGNOSIS — S92302G Fracture of unspecified metatarsal bone(s), left foot, subsequent encounter for fracture with delayed healing: Secondary | ICD-10-CM | POA: Diagnosis not present

## 2020-03-02 DIAGNOSIS — M792 Neuralgia and neuritis, unspecified: Secondary | ICD-10-CM | POA: Diagnosis not present

## 2020-03-02 DIAGNOSIS — M79661 Pain in right lower leg: Secondary | ICD-10-CM | POA: Diagnosis not present

## 2020-03-02 DIAGNOSIS — R262 Difficulty in walking, not elsewhere classified: Secondary | ICD-10-CM | POA: Diagnosis not present

## 2020-03-11 DIAGNOSIS — M779 Enthesopathy, unspecified: Secondary | ICD-10-CM | POA: Diagnosis not present

## 2020-03-12 DIAGNOSIS — K219 Gastro-esophageal reflux disease without esophagitis: Secondary | ICD-10-CM | POA: Diagnosis not present

## 2020-03-12 DIAGNOSIS — E7849 Other hyperlipidemia: Secondary | ICD-10-CM | POA: Diagnosis not present

## 2020-03-12 DIAGNOSIS — I10 Essential (primary) hypertension: Secondary | ICD-10-CM | POA: Diagnosis not present

## 2020-03-24 DIAGNOSIS — J4540 Moderate persistent asthma, uncomplicated: Secondary | ICD-10-CM | POA: Diagnosis not present

## 2020-03-24 DIAGNOSIS — H1045 Other chronic allergic conjunctivitis: Secondary | ICD-10-CM | POA: Diagnosis not present

## 2020-03-24 DIAGNOSIS — J300 Vasomotor rhinitis: Secondary | ICD-10-CM | POA: Diagnosis not present

## 2020-03-26 DIAGNOSIS — M47816 Spondylosis without myelopathy or radiculopathy, lumbar region: Secondary | ICD-10-CM | POA: Diagnosis not present

## 2020-03-26 DIAGNOSIS — M4722 Other spondylosis with radiculopathy, cervical region: Secondary | ICD-10-CM | POA: Diagnosis not present

## 2020-03-26 DIAGNOSIS — Z79891 Long term (current) use of opiate analgesic: Secondary | ICD-10-CM | POA: Diagnosis not present

## 2020-03-26 DIAGNOSIS — Z5181 Encounter for therapeutic drug level monitoring: Secondary | ICD-10-CM | POA: Diagnosis not present

## 2020-03-26 DIAGNOSIS — M5136 Other intervertebral disc degeneration, lumbar region: Secondary | ICD-10-CM | POA: Diagnosis not present

## 2020-03-26 DIAGNOSIS — M797 Fibromyalgia: Secondary | ICD-10-CM | POA: Diagnosis not present

## 2020-04-02 DIAGNOSIS — Z20828 Contact with and (suspected) exposure to other viral communicable diseases: Secondary | ICD-10-CM | POA: Diagnosis not present

## 2020-04-02 DIAGNOSIS — R059 Cough, unspecified: Secondary | ICD-10-CM | POA: Diagnosis not present

## 2020-04-10 DIAGNOSIS — E7849 Other hyperlipidemia: Secondary | ICD-10-CM | POA: Diagnosis not present

## 2020-04-10 DIAGNOSIS — K219 Gastro-esophageal reflux disease without esophagitis: Secondary | ICD-10-CM | POA: Diagnosis not present

## 2020-04-10 DIAGNOSIS — I10 Essential (primary) hypertension: Secondary | ICD-10-CM | POA: Diagnosis not present

## 2020-04-15 DIAGNOSIS — M779 Enthesopathy, unspecified: Secondary | ICD-10-CM | POA: Diagnosis not present

## 2020-04-15 DIAGNOSIS — M79671 Pain in right foot: Secondary | ICD-10-CM | POA: Diagnosis not present

## 2020-04-19 ENCOUNTER — Other Ambulatory Visit: Payer: Self-pay | Admitting: Neurology

## 2020-04-19 DIAGNOSIS — F0781 Postconcussional syndrome: Secondary | ICD-10-CM

## 2020-04-21 DIAGNOSIS — M4802 Spinal stenosis, cervical region: Secondary | ICD-10-CM | POA: Diagnosis not present

## 2020-04-21 DIAGNOSIS — F32A Depression, unspecified: Secondary | ICD-10-CM | POA: Diagnosis not present

## 2020-04-21 DIAGNOSIS — I10 Essential (primary) hypertension: Secondary | ICD-10-CM | POA: Diagnosis not present

## 2020-04-21 DIAGNOSIS — J45909 Unspecified asthma, uncomplicated: Secondary | ICD-10-CM | POA: Diagnosis not present

## 2020-04-21 DIAGNOSIS — M47816 Spondylosis without myelopathy or radiculopathy, lumbar region: Secondary | ICD-10-CM | POA: Diagnosis not present

## 2020-04-21 DIAGNOSIS — M47817 Spondylosis without myelopathy or radiculopathy, lumbosacral region: Secondary | ICD-10-CM | POA: Diagnosis not present

## 2020-04-21 DIAGNOSIS — M797 Fibromyalgia: Secondary | ICD-10-CM | POA: Diagnosis not present

## 2020-04-21 DIAGNOSIS — M4722 Other spondylosis with radiculopathy, cervical region: Secondary | ICD-10-CM | POA: Diagnosis not present

## 2020-04-21 DIAGNOSIS — K219 Gastro-esophageal reflux disease without esophagitis: Secondary | ICD-10-CM | POA: Diagnosis not present

## 2020-04-21 DIAGNOSIS — M461 Sacroiliitis, not elsewhere classified: Secondary | ICD-10-CM | POA: Diagnosis not present

## 2020-04-30 DIAGNOSIS — M792 Neuralgia and neuritis, unspecified: Secondary | ICD-10-CM | POA: Diagnosis not present

## 2020-05-04 DIAGNOSIS — Z7901 Long term (current) use of anticoagulants: Secondary | ICD-10-CM | POA: Diagnosis not present

## 2020-05-04 DIAGNOSIS — M1990 Unspecified osteoarthritis, unspecified site: Secondary | ICD-10-CM | POA: Diagnosis not present

## 2020-05-04 DIAGNOSIS — Z79899 Other long term (current) drug therapy: Secondary | ICD-10-CM | POA: Diagnosis not present

## 2020-05-04 DIAGNOSIS — K219 Gastro-esophageal reflux disease without esophagitis: Secondary | ICD-10-CM | POA: Diagnosis not present

## 2020-05-04 DIAGNOSIS — M47812 Spondylosis without myelopathy or radiculopathy, cervical region: Secondary | ICD-10-CM | POA: Diagnosis not present

## 2020-05-04 DIAGNOSIS — M5030 Other cervical disc degeneration, unspecified cervical region: Secondary | ICD-10-CM | POA: Diagnosis not present

## 2020-05-04 DIAGNOSIS — M4802 Spinal stenosis, cervical region: Secondary | ICD-10-CM | POA: Diagnosis not present

## 2020-05-04 DIAGNOSIS — M47816 Spondylosis without myelopathy or radiculopathy, lumbar region: Secondary | ICD-10-CM | POA: Diagnosis not present

## 2020-05-04 DIAGNOSIS — F32A Depression, unspecified: Secondary | ICD-10-CM | POA: Diagnosis not present

## 2020-05-10 DIAGNOSIS — I10 Essential (primary) hypertension: Secondary | ICD-10-CM | POA: Diagnosis not present

## 2020-05-10 DIAGNOSIS — K219 Gastro-esophageal reflux disease without esophagitis: Secondary | ICD-10-CM | POA: Diagnosis not present

## 2020-05-10 DIAGNOSIS — E7849 Other hyperlipidemia: Secondary | ICD-10-CM | POA: Diagnosis not present

## 2020-05-11 DIAGNOSIS — K219 Gastro-esophageal reflux disease without esophagitis: Secondary | ICD-10-CM | POA: Diagnosis not present

## 2020-05-11 DIAGNOSIS — M797 Fibromyalgia: Secondary | ICD-10-CM | POA: Diagnosis not present

## 2020-05-11 DIAGNOSIS — E041 Nontoxic single thyroid nodule: Secondary | ICD-10-CM | POA: Diagnosis not present

## 2020-05-11 DIAGNOSIS — E782 Mixed hyperlipidemia: Secondary | ICD-10-CM | POA: Diagnosis not present

## 2020-05-11 DIAGNOSIS — R946 Abnormal results of thyroid function studies: Secondary | ICD-10-CM | POA: Diagnosis not present

## 2020-05-11 DIAGNOSIS — K2100 Gastro-esophageal reflux disease with esophagitis, without bleeding: Secondary | ICD-10-CM | POA: Diagnosis not present

## 2020-05-11 DIAGNOSIS — R7301 Impaired fasting glucose: Secondary | ICD-10-CM | POA: Diagnosis not present

## 2020-05-11 DIAGNOSIS — I10 Essential (primary) hypertension: Secondary | ICD-10-CM | POA: Diagnosis not present

## 2020-05-11 DIAGNOSIS — Z1159 Encounter for screening for other viral diseases: Secondary | ICD-10-CM | POA: Diagnosis not present

## 2020-05-11 DIAGNOSIS — E7849 Other hyperlipidemia: Secondary | ICD-10-CM | POA: Diagnosis not present

## 2020-05-13 DIAGNOSIS — I700 Atherosclerosis of aorta: Secondary | ICD-10-CM | POA: Diagnosis not present

## 2020-05-13 DIAGNOSIS — Z6841 Body Mass Index (BMI) 40.0 and over, adult: Secondary | ICD-10-CM | POA: Diagnosis not present

## 2020-05-13 DIAGNOSIS — F1190 Opioid use, unspecified, uncomplicated: Secondary | ICD-10-CM | POA: Diagnosis not present

## 2020-05-13 DIAGNOSIS — I10 Essential (primary) hypertension: Secondary | ICD-10-CM | POA: Diagnosis not present

## 2020-05-13 DIAGNOSIS — E041 Nontoxic single thyroid nodule: Secondary | ICD-10-CM | POA: Diagnosis not present

## 2020-05-13 DIAGNOSIS — E7849 Other hyperlipidemia: Secondary | ICD-10-CM | POA: Diagnosis not present

## 2020-05-13 DIAGNOSIS — M797 Fibromyalgia: Secondary | ICD-10-CM | POA: Diagnosis not present

## 2020-05-13 DIAGNOSIS — J45909 Unspecified asthma, uncomplicated: Secondary | ICD-10-CM | POA: Diagnosis not present

## 2020-05-18 DIAGNOSIS — M79673 Pain in unspecified foot: Secondary | ICD-10-CM | POA: Diagnosis not present

## 2020-05-18 DIAGNOSIS — M779 Enthesopathy, unspecified: Secondary | ICD-10-CM | POA: Diagnosis not present

## 2020-06-09 DIAGNOSIS — K219 Gastro-esophageal reflux disease without esophagitis: Secondary | ICD-10-CM | POA: Diagnosis not present

## 2020-06-09 DIAGNOSIS — I10 Essential (primary) hypertension: Secondary | ICD-10-CM | POA: Diagnosis not present

## 2020-06-09 DIAGNOSIS — E7849 Other hyperlipidemia: Secondary | ICD-10-CM | POA: Diagnosis not present

## 2020-06-14 DIAGNOSIS — J322 Chronic ethmoidal sinusitis: Secondary | ICD-10-CM | POA: Diagnosis not present

## 2020-06-14 DIAGNOSIS — J320 Chronic maxillary sinusitis: Secondary | ICD-10-CM | POA: Diagnosis not present

## 2020-06-14 DIAGNOSIS — J310 Chronic rhinitis: Secondary | ICD-10-CM | POA: Diagnosis not present

## 2020-06-17 DIAGNOSIS — Z800 Family history of malignant neoplasm of digestive organs: Secondary | ICD-10-CM | POA: Diagnosis not present

## 2020-06-28 DIAGNOSIS — M797 Fibromyalgia: Secondary | ICD-10-CM | POA: Diagnosis not present

## 2020-06-28 DIAGNOSIS — G894 Chronic pain syndrome: Secondary | ICD-10-CM | POA: Diagnosis not present

## 2020-06-28 DIAGNOSIS — M47816 Spondylosis without myelopathy or radiculopathy, lumbar region: Secondary | ICD-10-CM | POA: Diagnosis not present

## 2020-06-28 DIAGNOSIS — M47812 Spondylosis without myelopathy or radiculopathy, cervical region: Secondary | ICD-10-CM | POA: Diagnosis not present

## 2020-06-28 DIAGNOSIS — M5136 Other intervertebral disc degeneration, lumbar region: Secondary | ICD-10-CM | POA: Diagnosis not present

## 2020-07-10 DIAGNOSIS — E7849 Other hyperlipidemia: Secondary | ICD-10-CM | POA: Diagnosis not present

## 2020-07-10 DIAGNOSIS — I10 Essential (primary) hypertension: Secondary | ICD-10-CM | POA: Diagnosis not present

## 2020-07-10 DIAGNOSIS — K219 Gastro-esophageal reflux disease without esophagitis: Secondary | ICD-10-CM | POA: Diagnosis not present

## 2020-07-19 DIAGNOSIS — J45909 Unspecified asthma, uncomplicated: Secondary | ICD-10-CM | POA: Diagnosis not present

## 2020-07-19 DIAGNOSIS — D123 Benign neoplasm of transverse colon: Secondary | ICD-10-CM | POA: Diagnosis not present

## 2020-07-19 DIAGNOSIS — K635 Polyp of colon: Secondary | ICD-10-CM | POA: Diagnosis not present

## 2020-07-19 DIAGNOSIS — K621 Rectal polyp: Secondary | ICD-10-CM | POA: Diagnosis not present

## 2020-07-19 DIAGNOSIS — K5730 Diverticulosis of large intestine without perforation or abscess without bleeding: Secondary | ICD-10-CM | POA: Diagnosis not present

## 2020-07-19 DIAGNOSIS — K219 Gastro-esophageal reflux disease without esophagitis: Secondary | ICD-10-CM | POA: Diagnosis not present

## 2020-07-19 DIAGNOSIS — Z800 Family history of malignant neoplasm of digestive organs: Secondary | ICD-10-CM | POA: Diagnosis not present

## 2020-07-19 DIAGNOSIS — F419 Anxiety disorder, unspecified: Secondary | ICD-10-CM | POA: Diagnosis not present

## 2020-07-19 DIAGNOSIS — Z1211 Encounter for screening for malignant neoplasm of colon: Secondary | ICD-10-CM | POA: Diagnosis not present

## 2020-07-19 DIAGNOSIS — I10 Essential (primary) hypertension: Secondary | ICD-10-CM | POA: Diagnosis not present

## 2020-07-21 DIAGNOSIS — M5136 Other intervertebral disc degeneration, lumbar region: Secondary | ICD-10-CM | POA: Diagnosis not present

## 2020-07-21 DIAGNOSIS — M4726 Other spondylosis with radiculopathy, lumbar region: Secondary | ICD-10-CM | POA: Diagnosis not present

## 2020-08-03 DIAGNOSIS — D126 Benign neoplasm of colon, unspecified: Secondary | ICD-10-CM | POA: Diagnosis not present

## 2020-08-04 DIAGNOSIS — M5459 Other low back pain: Secondary | ICD-10-CM | POA: Diagnosis not present

## 2020-08-04 DIAGNOSIS — M5136 Other intervertebral disc degeneration, lumbar region: Secondary | ICD-10-CM | POA: Diagnosis not present

## 2020-08-04 DIAGNOSIS — M4726 Other spondylosis with radiculopathy, lumbar region: Secondary | ICD-10-CM | POA: Diagnosis not present

## 2020-08-04 DIAGNOSIS — G894 Chronic pain syndrome: Secondary | ICD-10-CM | POA: Diagnosis not present

## 2020-08-04 DIAGNOSIS — M48061 Spinal stenosis, lumbar region without neurogenic claudication: Secondary | ICD-10-CM | POA: Diagnosis not present

## 2020-08-09 DIAGNOSIS — K219 Gastro-esophageal reflux disease without esophagitis: Secondary | ICD-10-CM | POA: Diagnosis not present

## 2020-08-09 DIAGNOSIS — I10 Essential (primary) hypertension: Secondary | ICD-10-CM | POA: Diagnosis not present

## 2020-08-09 DIAGNOSIS — E7849 Other hyperlipidemia: Secondary | ICD-10-CM | POA: Diagnosis not present

## 2020-08-10 DIAGNOSIS — E7849 Other hyperlipidemia: Secondary | ICD-10-CM | POA: Diagnosis not present

## 2020-08-10 DIAGNOSIS — E782 Mixed hyperlipidemia: Secondary | ICD-10-CM | POA: Diagnosis not present

## 2020-08-10 DIAGNOSIS — Z1329 Encounter for screening for other suspected endocrine disorder: Secondary | ICD-10-CM | POA: Diagnosis not present

## 2020-08-10 DIAGNOSIS — Z0001 Encounter for general adult medical examination with abnormal findings: Secondary | ICD-10-CM | POA: Diagnosis not present

## 2020-08-10 DIAGNOSIS — I10 Essential (primary) hypertension: Secondary | ICD-10-CM | POA: Diagnosis not present

## 2020-08-10 DIAGNOSIS — R7301 Impaired fasting glucose: Secondary | ICD-10-CM | POA: Diagnosis not present

## 2020-08-12 DIAGNOSIS — E7849 Other hyperlipidemia: Secondary | ICD-10-CM | POA: Diagnosis not present

## 2020-08-12 DIAGNOSIS — F331 Major depressive disorder, recurrent, moderate: Secondary | ICD-10-CM | POA: Diagnosis not present

## 2020-08-12 DIAGNOSIS — E041 Nontoxic single thyroid nodule: Secondary | ICD-10-CM | POA: Diagnosis not present

## 2020-08-12 DIAGNOSIS — Z0001 Encounter for general adult medical examination with abnormal findings: Secondary | ICD-10-CM | POA: Diagnosis not present

## 2020-08-12 DIAGNOSIS — I10 Essential (primary) hypertension: Secondary | ICD-10-CM | POA: Diagnosis not present

## 2020-08-12 DIAGNOSIS — K2100 Gastro-esophageal reflux disease with esophagitis, without bleeding: Secondary | ICD-10-CM | POA: Diagnosis not present

## 2020-08-12 DIAGNOSIS — F1190 Opioid use, unspecified, uncomplicated: Secondary | ICD-10-CM | POA: Diagnosis not present

## 2020-08-12 DIAGNOSIS — I700 Atherosclerosis of aorta: Secondary | ICD-10-CM | POA: Diagnosis not present

## 2020-08-12 DIAGNOSIS — J45909 Unspecified asthma, uncomplicated: Secondary | ICD-10-CM | POA: Diagnosis not present

## 2020-08-18 DIAGNOSIS — M5117 Intervertebral disc disorders with radiculopathy, lumbosacral region: Secondary | ICD-10-CM | POA: Diagnosis not present

## 2020-08-18 DIAGNOSIS — M4726 Other spondylosis with radiculopathy, lumbar region: Secondary | ICD-10-CM | POA: Diagnosis not present

## 2020-08-18 DIAGNOSIS — M5116 Intervertebral disc disorders with radiculopathy, lumbar region: Secondary | ICD-10-CM | POA: Diagnosis not present

## 2020-08-18 DIAGNOSIS — M48061 Spinal stenosis, lumbar region without neurogenic claudication: Secondary | ICD-10-CM | POA: Diagnosis not present

## 2020-08-18 DIAGNOSIS — M5136 Other intervertebral disc degeneration, lumbar region: Secondary | ICD-10-CM | POA: Diagnosis not present

## 2020-08-20 DIAGNOSIS — Z23 Encounter for immunization: Secondary | ICD-10-CM | POA: Diagnosis not present

## 2020-08-24 DIAGNOSIS — H6980 Other specified disorders of Eustachian tube, unspecified ear: Secondary | ICD-10-CM | POA: Diagnosis not present

## 2020-08-24 DIAGNOSIS — H6590 Unspecified nonsuppurative otitis media, unspecified ear: Secondary | ICD-10-CM | POA: Diagnosis not present

## 2020-08-24 DIAGNOSIS — J309 Allergic rhinitis, unspecified: Secondary | ICD-10-CM | POA: Diagnosis not present

## 2020-08-24 DIAGNOSIS — I889 Nonspecific lymphadenitis, unspecified: Secondary | ICD-10-CM | POA: Diagnosis not present

## 2020-09-09 DIAGNOSIS — M9903 Segmental and somatic dysfunction of lumbar region: Secondary | ICD-10-CM | POA: Diagnosis not present

## 2020-09-09 DIAGNOSIS — I10 Essential (primary) hypertension: Secondary | ICD-10-CM | POA: Diagnosis not present

## 2020-09-09 DIAGNOSIS — S338XXA Sprain of other parts of lumbar spine and pelvis, initial encounter: Secondary | ICD-10-CM | POA: Diagnosis not present

## 2020-09-09 DIAGNOSIS — M9901 Segmental and somatic dysfunction of cervical region: Secondary | ICD-10-CM | POA: Diagnosis not present

## 2020-09-09 DIAGNOSIS — M9902 Segmental and somatic dysfunction of thoracic region: Secondary | ICD-10-CM | POA: Diagnosis not present

## 2020-09-09 DIAGNOSIS — E7849 Other hyperlipidemia: Secondary | ICD-10-CM | POA: Diagnosis not present

## 2020-09-09 DIAGNOSIS — S233XXA Sprain of ligaments of thoracic spine, initial encounter: Secondary | ICD-10-CM | POA: Diagnosis not present

## 2020-09-09 DIAGNOSIS — S134XXA Sprain of ligaments of cervical spine, initial encounter: Secondary | ICD-10-CM | POA: Diagnosis not present

## 2020-09-09 DIAGNOSIS — K219 Gastro-esophageal reflux disease without esophagitis: Secondary | ICD-10-CM | POA: Diagnosis not present

## 2020-09-16 DIAGNOSIS — S233XXA Sprain of ligaments of thoracic spine, initial encounter: Secondary | ICD-10-CM | POA: Diagnosis not present

## 2020-09-16 DIAGNOSIS — S338XXA Sprain of other parts of lumbar spine and pelvis, initial encounter: Secondary | ICD-10-CM | POA: Diagnosis not present

## 2020-09-16 DIAGNOSIS — M9903 Segmental and somatic dysfunction of lumbar region: Secondary | ICD-10-CM | POA: Diagnosis not present

## 2020-09-16 DIAGNOSIS — S134XXA Sprain of ligaments of cervical spine, initial encounter: Secondary | ICD-10-CM | POA: Diagnosis not present

## 2020-09-16 DIAGNOSIS — M9901 Segmental and somatic dysfunction of cervical region: Secondary | ICD-10-CM | POA: Diagnosis not present

## 2020-09-16 DIAGNOSIS — M9902 Segmental and somatic dysfunction of thoracic region: Secondary | ICD-10-CM | POA: Diagnosis not present

## 2020-09-28 DIAGNOSIS — M9902 Segmental and somatic dysfunction of thoracic region: Secondary | ICD-10-CM | POA: Diagnosis not present

## 2020-09-28 DIAGNOSIS — S338XXA Sprain of other parts of lumbar spine and pelvis, initial encounter: Secondary | ICD-10-CM | POA: Diagnosis not present

## 2020-09-28 DIAGNOSIS — M9901 Segmental and somatic dysfunction of cervical region: Secondary | ICD-10-CM | POA: Diagnosis not present

## 2020-09-28 DIAGNOSIS — M9903 Segmental and somatic dysfunction of lumbar region: Secondary | ICD-10-CM | POA: Diagnosis not present

## 2020-09-28 DIAGNOSIS — S134XXA Sprain of ligaments of cervical spine, initial encounter: Secondary | ICD-10-CM | POA: Diagnosis not present

## 2020-09-28 DIAGNOSIS — S233XXA Sprain of ligaments of thoracic spine, initial encounter: Secondary | ICD-10-CM | POA: Diagnosis not present

## 2020-10-10 DIAGNOSIS — I10 Essential (primary) hypertension: Secondary | ICD-10-CM | POA: Diagnosis not present

## 2020-10-10 DIAGNOSIS — E7849 Other hyperlipidemia: Secondary | ICD-10-CM | POA: Diagnosis not present

## 2020-10-10 DIAGNOSIS — K219 Gastro-esophageal reflux disease without esophagitis: Secondary | ICD-10-CM | POA: Diagnosis not present

## 2020-10-13 DIAGNOSIS — S134XXA Sprain of ligaments of cervical spine, initial encounter: Secondary | ICD-10-CM | POA: Diagnosis not present

## 2020-10-13 DIAGNOSIS — M9903 Segmental and somatic dysfunction of lumbar region: Secondary | ICD-10-CM | POA: Diagnosis not present

## 2020-10-13 DIAGNOSIS — M9902 Segmental and somatic dysfunction of thoracic region: Secondary | ICD-10-CM | POA: Diagnosis not present

## 2020-10-13 DIAGNOSIS — S233XXA Sprain of ligaments of thoracic spine, initial encounter: Secondary | ICD-10-CM | POA: Diagnosis not present

## 2020-10-13 DIAGNOSIS — S338XXA Sprain of other parts of lumbar spine and pelvis, initial encounter: Secondary | ICD-10-CM | POA: Diagnosis not present

## 2020-10-13 DIAGNOSIS — M9901 Segmental and somatic dysfunction of cervical region: Secondary | ICD-10-CM | POA: Diagnosis not present

## 2020-10-20 DIAGNOSIS — M47816 Spondylosis without myelopathy or radiculopathy, lumbar region: Secondary | ICD-10-CM | POA: Diagnosis not present

## 2020-10-26 DIAGNOSIS — Z0100 Encounter for examination of eyes and vision without abnormal findings: Secondary | ICD-10-CM | POA: Diagnosis not present

## 2020-10-26 DIAGNOSIS — S134XXA Sprain of ligaments of cervical spine, initial encounter: Secondary | ICD-10-CM | POA: Diagnosis not present

## 2020-10-26 DIAGNOSIS — S338XXA Sprain of other parts of lumbar spine and pelvis, initial encounter: Secondary | ICD-10-CM | POA: Diagnosis not present

## 2020-10-26 DIAGNOSIS — H524 Presbyopia: Secondary | ICD-10-CM | POA: Diagnosis not present

## 2020-10-26 DIAGNOSIS — M9903 Segmental and somatic dysfunction of lumbar region: Secondary | ICD-10-CM | POA: Diagnosis not present

## 2020-10-26 DIAGNOSIS — M9901 Segmental and somatic dysfunction of cervical region: Secondary | ICD-10-CM | POA: Diagnosis not present

## 2020-10-26 DIAGNOSIS — M9902 Segmental and somatic dysfunction of thoracic region: Secondary | ICD-10-CM | POA: Diagnosis not present

## 2020-10-26 DIAGNOSIS — S233XXA Sprain of ligaments of thoracic spine, initial encounter: Secondary | ICD-10-CM | POA: Diagnosis not present

## 2020-11-03 DIAGNOSIS — M47816 Spondylosis without myelopathy or radiculopathy, lumbar region: Secondary | ICD-10-CM | POA: Diagnosis not present

## 2020-11-03 DIAGNOSIS — G894 Chronic pain syndrome: Secondary | ICD-10-CM | POA: Diagnosis not present

## 2020-11-03 DIAGNOSIS — M5136 Other intervertebral disc degeneration, lumbar region: Secondary | ICD-10-CM | POA: Diagnosis not present

## 2020-11-03 DIAGNOSIS — M4726 Other spondylosis with radiculopathy, lumbar region: Secondary | ICD-10-CM | POA: Diagnosis not present

## 2020-11-09 DIAGNOSIS — M9901 Segmental and somatic dysfunction of cervical region: Secondary | ICD-10-CM | POA: Diagnosis not present

## 2020-11-09 DIAGNOSIS — S134XXA Sprain of ligaments of cervical spine, initial encounter: Secondary | ICD-10-CM | POA: Diagnosis not present

## 2020-11-09 DIAGNOSIS — M9903 Segmental and somatic dysfunction of lumbar region: Secondary | ICD-10-CM | POA: Diagnosis not present

## 2020-11-09 DIAGNOSIS — M9902 Segmental and somatic dysfunction of thoracic region: Secondary | ICD-10-CM | POA: Diagnosis not present

## 2020-11-09 DIAGNOSIS — S338XXA Sprain of other parts of lumbar spine and pelvis, initial encounter: Secondary | ICD-10-CM | POA: Diagnosis not present

## 2020-11-09 DIAGNOSIS — S233XXA Sprain of ligaments of thoracic spine, initial encounter: Secondary | ICD-10-CM | POA: Diagnosis not present

## 2020-11-10 DIAGNOSIS — E7849 Other hyperlipidemia: Secondary | ICD-10-CM | POA: Diagnosis not present

## 2020-11-10 DIAGNOSIS — I10 Essential (primary) hypertension: Secondary | ICD-10-CM | POA: Diagnosis not present

## 2020-11-10 DIAGNOSIS — K219 Gastro-esophageal reflux disease without esophagitis: Secondary | ICD-10-CM | POA: Diagnosis not present

## 2020-11-23 DIAGNOSIS — Z1231 Encounter for screening mammogram for malignant neoplasm of breast: Secondary | ICD-10-CM | POA: Diagnosis not present

## 2020-12-02 DIAGNOSIS — M4726 Other spondylosis with radiculopathy, lumbar region: Secondary | ICD-10-CM | POA: Diagnosis not present

## 2020-12-02 DIAGNOSIS — M5136 Other intervertebral disc degeneration, lumbar region: Secondary | ICD-10-CM | POA: Diagnosis not present

## 2020-12-02 DIAGNOSIS — M48061 Spinal stenosis, lumbar region without neurogenic claudication: Secondary | ICD-10-CM | POA: Diagnosis not present

## 2020-12-08 DIAGNOSIS — E7849 Other hyperlipidemia: Secondary | ICD-10-CM | POA: Diagnosis not present

## 2020-12-08 DIAGNOSIS — K2100 Gastro-esophageal reflux disease with esophagitis, without bleeding: Secondary | ICD-10-CM | POA: Diagnosis not present

## 2020-12-08 DIAGNOSIS — I10 Essential (primary) hypertension: Secondary | ICD-10-CM | POA: Diagnosis not present

## 2020-12-08 DIAGNOSIS — R7301 Impaired fasting glucose: Secondary | ICD-10-CM | POA: Diagnosis not present

## 2020-12-08 DIAGNOSIS — E782 Mixed hyperlipidemia: Secondary | ICD-10-CM | POA: Diagnosis not present

## 2020-12-13 DIAGNOSIS — J310 Chronic rhinitis: Secondary | ICD-10-CM | POA: Diagnosis not present

## 2020-12-13 DIAGNOSIS — J320 Chronic maxillary sinusitis: Secondary | ICD-10-CM | POA: Diagnosis not present

## 2020-12-13 DIAGNOSIS — J322 Chronic ethmoidal sinusitis: Secondary | ICD-10-CM | POA: Diagnosis not present

## 2020-12-16 DIAGNOSIS — J45909 Unspecified asthma, uncomplicated: Secondary | ICD-10-CM | POA: Diagnosis not present

## 2020-12-16 DIAGNOSIS — E041 Nontoxic single thyroid nodule: Secondary | ICD-10-CM | POA: Diagnosis not present

## 2020-12-16 DIAGNOSIS — F1190 Opioid use, unspecified, uncomplicated: Secondary | ICD-10-CM | POA: Diagnosis not present

## 2020-12-16 DIAGNOSIS — Z23 Encounter for immunization: Secondary | ICD-10-CM | POA: Diagnosis not present

## 2020-12-16 DIAGNOSIS — I2699 Other pulmonary embolism without acute cor pulmonale: Secondary | ICD-10-CM | POA: Diagnosis not present

## 2020-12-16 DIAGNOSIS — I700 Atherosclerosis of aorta: Secondary | ICD-10-CM | POA: Diagnosis not present

## 2020-12-16 DIAGNOSIS — I10 Essential (primary) hypertension: Secondary | ICD-10-CM | POA: Diagnosis not present

## 2020-12-16 DIAGNOSIS — E7849 Other hyperlipidemia: Secondary | ICD-10-CM | POA: Diagnosis not present

## 2020-12-16 DIAGNOSIS — R7301 Impaired fasting glucose: Secondary | ICD-10-CM | POA: Diagnosis not present

## 2021-01-04 DIAGNOSIS — M461 Sacroiliitis, not elsewhere classified: Secondary | ICD-10-CM | POA: Diagnosis not present

## 2021-01-04 DIAGNOSIS — M5126 Other intervertebral disc displacement, lumbar region: Secondary | ICD-10-CM | POA: Diagnosis not present

## 2021-01-04 DIAGNOSIS — M5136 Other intervertebral disc degeneration, lumbar region: Secondary | ICD-10-CM | POA: Diagnosis not present

## 2021-01-04 DIAGNOSIS — M5416 Radiculopathy, lumbar region: Secondary | ICD-10-CM | POA: Diagnosis not present

## 2021-01-12 DIAGNOSIS — Z23 Encounter for immunization: Secondary | ICD-10-CM | POA: Diagnosis not present

## 2021-01-22 DIAGNOSIS — J209 Acute bronchitis, unspecified: Secondary | ICD-10-CM | POA: Diagnosis not present

## 2021-02-07 DIAGNOSIS — M461 Sacroiliitis, not elsewhere classified: Secondary | ICD-10-CM | POA: Diagnosis not present

## 2021-02-07 DIAGNOSIS — Z79891 Long term (current) use of opiate analgesic: Secondary | ICD-10-CM | POA: Diagnosis not present

## 2021-02-07 DIAGNOSIS — M5136 Other intervertebral disc degeneration, lumbar region: Secondary | ICD-10-CM | POA: Diagnosis not present

## 2021-02-07 DIAGNOSIS — G894 Chronic pain syndrome: Secondary | ICD-10-CM | POA: Diagnosis not present

## 2021-02-28 DIAGNOSIS — Z91040 Latex allergy status: Secondary | ICD-10-CM | POA: Diagnosis not present

## 2021-02-28 DIAGNOSIS — Z888 Allergy status to other drugs, medicaments and biological substances status: Secondary | ICD-10-CM | POA: Diagnosis not present

## 2021-02-28 DIAGNOSIS — J45909 Unspecified asthma, uncomplicated: Secondary | ICD-10-CM | POA: Diagnosis not present

## 2021-02-28 DIAGNOSIS — M461 Sacroiliitis, not elsewhere classified: Secondary | ICD-10-CM | POA: Diagnosis not present

## 2021-02-28 DIAGNOSIS — Z79899 Other long term (current) drug therapy: Secondary | ICD-10-CM | POA: Diagnosis not present

## 2021-02-28 DIAGNOSIS — M5136 Other intervertebral disc degeneration, lumbar region: Secondary | ICD-10-CM | POA: Diagnosis not present

## 2021-02-28 DIAGNOSIS — G8929 Other chronic pain: Secondary | ICD-10-CM | POA: Diagnosis not present

## 2021-02-28 DIAGNOSIS — K219 Gastro-esophageal reflux disease without esophagitis: Secondary | ICD-10-CM | POA: Diagnosis not present

## 2021-03-15 DIAGNOSIS — N393 Stress incontinence (female) (male): Secondary | ICD-10-CM | POA: Diagnosis not present

## 2021-03-15 DIAGNOSIS — N3946 Mixed incontinence: Secondary | ICD-10-CM | POA: Diagnosis not present

## 2021-03-15 DIAGNOSIS — N3941 Urge incontinence: Secondary | ICD-10-CM | POA: Diagnosis not present

## 2021-03-21 ENCOUNTER — Other Ambulatory Visit: Payer: Self-pay | Admitting: Allergy

## 2021-03-21 ENCOUNTER — Ambulatory Visit
Admission: RE | Admit: 2021-03-21 | Discharge: 2021-03-21 | Disposition: A | Discharge status: Home / Self Care | Payer: Medicare HMO | Source: Ambulatory Visit | Attending: Allergy | Admitting: Allergy

## 2021-03-21 ENCOUNTER — Other Ambulatory Visit: Payer: Self-pay

## 2021-03-21 DIAGNOSIS — J209 Acute bronchitis, unspecified: Secondary | ICD-10-CM

## 2021-03-21 DIAGNOSIS — R059 Cough, unspecified: Secondary | ICD-10-CM | POA: Diagnosis not present

## 2021-03-21 DIAGNOSIS — J300 Vasomotor rhinitis: Secondary | ICD-10-CM | POA: Diagnosis not present

## 2021-03-21 DIAGNOSIS — H1045 Other chronic allergic conjunctivitis: Secondary | ICD-10-CM | POA: Diagnosis not present

## 2021-03-21 DIAGNOSIS — J4540 Moderate persistent asthma, uncomplicated: Secondary | ICD-10-CM | POA: Diagnosis not present

## 2021-04-04 DIAGNOSIS — J45901 Unspecified asthma with (acute) exacerbation: Secondary | ICD-10-CM | POA: Diagnosis not present

## 2021-04-04 DIAGNOSIS — J069 Acute upper respiratory infection, unspecified: Secondary | ICD-10-CM | POA: Diagnosis not present

## 2021-04-04 DIAGNOSIS — Z20828 Contact with and (suspected) exposure to other viral communicable diseases: Secondary | ICD-10-CM | POA: Diagnosis not present

## 2021-04-07 DIAGNOSIS — H16143 Punctate keratitis, bilateral: Secondary | ICD-10-CM | POA: Diagnosis not present

## 2021-04-07 DIAGNOSIS — H04123 Dry eye syndrome of bilateral lacrimal glands: Secondary | ICD-10-CM | POA: Diagnosis not present

## 2021-04-14 DIAGNOSIS — N3946 Mixed incontinence: Secondary | ICD-10-CM | POA: Diagnosis not present

## 2021-04-14 DIAGNOSIS — N951 Menopausal and female climacteric states: Secondary | ICD-10-CM | POA: Diagnosis not present

## 2021-04-14 DIAGNOSIS — N952 Postmenopausal atrophic vaginitis: Secondary | ICD-10-CM | POA: Diagnosis not present

## 2021-04-14 DIAGNOSIS — Z01419 Encounter for gynecological examination (general) (routine) without abnormal findings: Secondary | ICD-10-CM | POA: Diagnosis not present

## 2021-04-14 DIAGNOSIS — Z6841 Body Mass Index (BMI) 40.0 and over, adult: Secondary | ICD-10-CM | POA: Diagnosis not present

## 2021-04-15 DIAGNOSIS — R7301 Impaired fasting glucose: Secondary | ICD-10-CM | POA: Diagnosis not present

## 2021-04-15 DIAGNOSIS — I10 Essential (primary) hypertension: Secondary | ICD-10-CM | POA: Diagnosis not present

## 2021-04-15 DIAGNOSIS — E782 Mixed hyperlipidemia: Secondary | ICD-10-CM | POA: Diagnosis not present

## 2021-04-15 DIAGNOSIS — K2100 Gastro-esophageal reflux disease with esophagitis, without bleeding: Secondary | ICD-10-CM | POA: Diagnosis not present

## 2021-04-15 DIAGNOSIS — Z1329 Encounter for screening for other suspected endocrine disorder: Secondary | ICD-10-CM | POA: Diagnosis not present

## 2021-04-15 DIAGNOSIS — E7849 Other hyperlipidemia: Secondary | ICD-10-CM | POA: Diagnosis not present

## 2021-04-19 DIAGNOSIS — J45909 Unspecified asthma, uncomplicated: Secondary | ICD-10-CM | POA: Diagnosis not present

## 2021-04-19 DIAGNOSIS — I700 Atherosclerosis of aorta: Secondary | ICD-10-CM | POA: Diagnosis not present

## 2021-04-19 DIAGNOSIS — E7849 Other hyperlipidemia: Secondary | ICD-10-CM | POA: Diagnosis not present

## 2021-04-19 DIAGNOSIS — M797 Fibromyalgia: Secondary | ICD-10-CM | POA: Diagnosis not present

## 2021-04-19 DIAGNOSIS — F1190 Opioid use, unspecified, uncomplicated: Secondary | ICD-10-CM | POA: Diagnosis not present

## 2021-04-19 DIAGNOSIS — R4582 Worries: Secondary | ICD-10-CM | POA: Diagnosis not present

## 2021-04-19 DIAGNOSIS — E041 Nontoxic single thyroid nodule: Secondary | ICD-10-CM | POA: Diagnosis not present

## 2021-04-19 DIAGNOSIS — I10 Essential (primary) hypertension: Secondary | ICD-10-CM | POA: Diagnosis not present

## 2021-05-10 DIAGNOSIS — G894 Chronic pain syndrome: Secondary | ICD-10-CM | POA: Diagnosis not present

## 2021-05-10 DIAGNOSIS — M461 Sacroiliitis, not elsewhere classified: Secondary | ICD-10-CM | POA: Diagnosis not present

## 2021-05-10 DIAGNOSIS — M5136 Other intervertebral disc degeneration, lumbar region: Secondary | ICD-10-CM | POA: Diagnosis not present

## 2021-05-10 DIAGNOSIS — M47816 Spondylosis without myelopathy or radiculopathy, lumbar region: Secondary | ICD-10-CM | POA: Diagnosis not present
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
## 2021-05-13 ENCOUNTER — Encounter: Payer: Self-pay | Admitting: Obstetrics and Gynecology

## 2021-05-13 ENCOUNTER — Ambulatory Visit (INDEPENDENT_AMBULATORY_CARE_PROVIDER_SITE_OTHER): Payer: Medicare HMO | Admitting: Obstetrics and Gynecology

## 2021-05-13 ENCOUNTER — Other Ambulatory Visit: Payer: Self-pay

## 2021-05-13 VITALS — BP 141/94 | HR 73 | Ht 64.0 in | Wt 224.0 lb

## 2021-05-13 DIAGNOSIS — R350 Frequency of micturition: Secondary | ICD-10-CM

## 2021-05-13 DIAGNOSIS — N393 Stress incontinence (female) (male): Secondary | ICD-10-CM

## 2021-05-13 DIAGNOSIS — N3281 Overactive bladder: Secondary | ICD-10-CM | POA: Diagnosis not present

## 2021-05-13 LAB — POCT URINALYSIS DIPSTICK
Appearance: NORMAL
Bilirubin, UA: NEGATIVE
Glucose, UA: NEGATIVE
Ketones, UA: NEGATIVE
Leukocytes, UA: NEGATIVE
Nitrite, UA: NEGATIVE
Protein, UA: NEGATIVE
Spec Grav, UA: >=1.03 — AB (ref 1.010–1.025)
Urobilinogen, UA: 0.2 E.U./dL
pH, UA: 6.5 (ref 5.0–8.0)

## 2021-05-13 NOTE — Patient Instructions (Signed)

## 2021-05-16 NOTE — Progress Notes (Signed)
PA needed for PTNS 12 visits. ?[facility] was contacted to start PA [phone] ?Auth #: [id number] ?Tracking #: [id number] ? ?STATUS: Pending ?

## 2021-05-19 NOTE — Progress Notes (Signed)
error 

## 2021-05-19 NOTE — Progress Notes (Addendum)
Theresa Lucas from [place] on the VM for additional notes for case # [id number] (PTNS) ?-Visit note and Labs have been faxed to [phone] ?Status: PENDING ? ?

## 2021-05-23 DIAGNOSIS — M461 Sacroiliitis, not elsewhere classified: Secondary | ICD-10-CM | POA: Diagnosis not present

## 2021-05-23 DIAGNOSIS — M47816 Spondylosis without myelopathy or radiculopathy, lumbar region: Secondary | ICD-10-CM | POA: Diagnosis not present

## 2021-05-24 NOTE — Progress Notes (Addendum)
[employer] called with an APPROVAL for 12 units of PTNS ?Valid through 05/16/2021- 08/14/2021 ?Auth #: [id number] ?

## 2021-05-30 ENCOUNTER — Other Ambulatory Visit: Payer: Self-pay

## 2021-05-30 ENCOUNTER — Encounter: Payer: Self-pay | Admitting: Physical Therapy

## 2021-05-30 ENCOUNTER — Ambulatory Visit: Payer: Medicare HMO | Attending: Obstetrics and Gynecology | Admitting: Physical Therapy

## 2021-05-30 DIAGNOSIS — M6281 Muscle weakness (generalized): Secondary | ICD-10-CM | POA: Diagnosis not present

## 2021-05-30 DIAGNOSIS — R279 Unspecified lack of coordination: Secondary | ICD-10-CM | POA: Diagnosis not present

## 2021-05-30 DIAGNOSIS — R293 Abnormal posture: Secondary | ICD-10-CM | POA: Insufficient documentation
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
## 2021-05-30 NOTE — Patient Instructions (Signed)
Urge Incontinence ? ?Ideal urination frequency is every 2-4 wakeful hours, which equates to 5-8 times within a 24-hour period.   ?Urge incontinence is leakage that occurs when the bladder muscle contracts, creating a sudden need to go before getting to the bathroom.   ?Going too often when your bladder isn't actually full can disrupt the body's automatic signals to store and hold urine longer, which will increase urgency/frequency.  In this case, the bladder ?is running the show? and strategies can be learned to retrain this pattern.   ?One should be able to control the first urge to urinate, at around 159m.  The bladder can hold up to a ?grande latte,? or 4076m ?To help you gain control, practice the Urge Drill below when urgency strikes.  This drill will help retrain your bladder signals and allow you to store and hold urine longer.  The overall goal is to stretch out your time between voids to reach a more manageable voiding schedule.    ?Practice your "quick flicks" often throughout the day (each waking hour) even when you don't need feel the urge to go.  This will help strengthen your pelvic floor muscles, making them more effective in controlling leakage. ? ?Urge Drill ? ?When you feel an urge to go, follow these steps to regain control: ?Stop what you are doing and be still ?Take one deep breath, directing your air into your abdomen ?Think an affirming thought, such as ?I've got this.? ?Do 5 quick flicks of your pelvic floor ?Walk with control to the bathroom to void, or delay voiding ? ? ? ?Bladder Irritants ? ?Certain foods and beverages can be irritating to the bladder.  Avoiding these irritants may decrease your symptoms of urinary urgency, frequency or bladder pain.  Even reducing your intake can help with your symptoms.  Not everyone is sensitive to all bladder irritants, so you may consider focusing on one irritant at a time, removing or reducing your intake of that irritant for 7-10 days to see if  this change helps your symptoms.  Water intake is also very important. ? ?Below is a list of bladder irritants. ? ?Drinks: alcohol, carbonated beverages, caffeinated beverages such as coffee and tea, drinks with artificial sweeteners, citrus juices, apple juice, tomato juice ? ?Foods: tomatoes and tomato based foods, spicy food, sugar and artificial sweeteners, vinegar, chocolate, raw onion, apples, citrus fruits, pineapple, cranberries, tomatoes, strawberries, plums, peaches, cantaloupe ? ?Other: acidic urine (too concentrated) - see water intake info below ? ?Substitutes you can try that are NOT irritating to the bladder: cooked onion, pears, papayas, sun-brewed decaf teas, watermelons, non-citrus herbal teas, apricots, kava and low-acid instant drinks (Postum). ? ? ? ?WATER INTAKE: Remember to drink lots of water (aim for fluid intake of half your body weight with 2/3 of fluids being water).  You may be limiting fluids due to fear of leakage, but this can actually worsen urgency symptoms due to highly concentrated urine.  Water helps balance the pH of your urine so it doesn't become too acidic - acidic urine is a bladder irritant! ? ? ? ?THE KNACK ? ?The Knack is a strategy you may use to help to reduce or prevent leakage or passing of urine, gas or feces during an activity that causes downward force on the pelvic floor muscles.   ? ?Activities that can cause downward pressure on the pelvic floor muscles include coughing, sneezing, laughing, bending, lifting, and transitioning from different body positions such as from laying down  to sitting up and sitting to standing. ? ?To perform The Knack, consciously squeeze and lift your pelvic floor muscles to perform a strong, well-timed pelvic muscle contraction BEFORE AND DURING these activities above.  As your contraction gets more coordinated and your muscles get stronger, you will become more effective in controlling your experience of incontinence or gas passing  during these activities.   ? ?

## 2021-06-01 DIAGNOSIS — Z20828 Contact with and (suspected) exposure to other viral communicable diseases: Secondary | ICD-10-CM | POA: Diagnosis not present

## 2021-06-01 DIAGNOSIS — R509 Fever, unspecified: Secondary | ICD-10-CM | POA: Diagnosis not present

## 2021-06-01 DIAGNOSIS — Z6841 Body Mass Index (BMI) 40.0 and over, adult: Secondary | ICD-10-CM | POA: Diagnosis not present

## 2021-06-01 DIAGNOSIS — J329 Chronic sinusitis, unspecified: Secondary | ICD-10-CM | POA: Diagnosis not present

## 2021-06-01 DIAGNOSIS — R059 Cough, unspecified: Secondary | ICD-10-CM | POA: Diagnosis not present

## 2021-06-01 DIAGNOSIS — J029 Acute pharyngitis, unspecified: Secondary | ICD-10-CM | POA: Diagnosis not present

## 2021-06-07 DIAGNOSIS — I10 Essential (primary) hypertension: Secondary | ICD-10-CM | POA: Diagnosis not present

## 2021-06-07 DIAGNOSIS — Z6841 Body Mass Index (BMI) 40.0 and over, adult: Secondary | ICD-10-CM | POA: Diagnosis not present

## 2021-06-07 DIAGNOSIS — R059 Cough, unspecified: Secondary | ICD-10-CM | POA: Diagnosis not present

## 2021-06-07 DIAGNOSIS — J45909 Unspecified asthma, uncomplicated: Secondary | ICD-10-CM | POA: Diagnosis not present

## 2021-06-07 DIAGNOSIS — Z20828 Contact with and (suspected) exposure to other viral communicable diseases: Secondary | ICD-10-CM | POA: Diagnosis not present

## 2021-06-09 ENCOUNTER — Encounter: Payer: Medicare HMO | Admitting: Physical Therapy

## 2021-06-13 ENCOUNTER — Ambulatory Visit: Payer: Medicare HMO | Attending: Obstetrics and Gynecology | Admitting: Physical Therapy

## 2021-06-13 DIAGNOSIS — R279 Unspecified lack of coordination: Secondary | ICD-10-CM | POA: Insufficient documentation

## 2021-06-13 DIAGNOSIS — M6281 Muscle weakness (generalized): Secondary | ICD-10-CM | POA: Insufficient documentation

## 2021-06-13 DIAGNOSIS — R293 Abnormal posture: Secondary | ICD-10-CM | POA: Diagnosis not present
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
## 2021-06-14 DIAGNOSIS — J322 Chronic ethmoidal sinusitis: Secondary | ICD-10-CM | POA: Diagnosis not present

## 2021-06-14 DIAGNOSIS — J320 Chronic maxillary sinusitis: Secondary | ICD-10-CM | POA: Diagnosis not present

## 2021-06-14 DIAGNOSIS — J310 Chronic rhinitis: Secondary | ICD-10-CM | POA: Diagnosis not present

## 2021-06-20 DIAGNOSIS — J4540 Moderate persistent asthma, uncomplicated: Secondary | ICD-10-CM | POA: Diagnosis not present

## 2021-06-20 DIAGNOSIS — J300 Vasomotor rhinitis: Secondary | ICD-10-CM | POA: Diagnosis not present

## 2021-06-20 DIAGNOSIS — H1045 Other chronic allergic conjunctivitis: Secondary | ICD-10-CM | POA: Diagnosis not present

## 2021-06-21 ENCOUNTER — Ambulatory Visit: Payer: Medicare HMO | Admitting: Physical Therapy

## 2021-06-21 DIAGNOSIS — M6281 Muscle weakness (generalized): Secondary | ICD-10-CM

## 2021-06-21 DIAGNOSIS — R293 Abnormal posture: Secondary | ICD-10-CM

## 2021-06-21 DIAGNOSIS — R279 Unspecified lack of coordination: Secondary | ICD-10-CM | POA: Diagnosis not present

## 2021-06-21 NOTE — Patient Instructions (Signed)
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
## 2021-06-27 ENCOUNTER — Ambulatory Visit: Payer: Medicare HMO | Admitting: Physical Therapy

## 2021-06-27 DIAGNOSIS — M6281 Muscle weakness (generalized): Secondary | ICD-10-CM | POA: Diagnosis not present

## 2021-06-27 DIAGNOSIS — R279 Unspecified lack of coordination: Secondary | ICD-10-CM | POA: Diagnosis not present

## 2021-06-27 DIAGNOSIS — R293 Abnormal posture: Secondary | ICD-10-CM | POA: Diagnosis not present
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
## 2021-07-04 ENCOUNTER — Encounter: Payer: Medicare HMO | Admitting: Physical Therapy

## 2021-07-04 DIAGNOSIS — M461 Sacroiliitis, not elsewhere classified: Secondary | ICD-10-CM | POA: Diagnosis not present

## 2021-07-04 DIAGNOSIS — G894 Chronic pain syndrome: Secondary | ICD-10-CM | POA: Diagnosis not present

## 2021-07-04 DIAGNOSIS — M47816 Spondylosis without myelopathy or radiculopathy, lumbar region: Secondary | ICD-10-CM | POA: Diagnosis not present

## 2021-07-05 ENCOUNTER — Ambulatory Visit: Payer: Medicare HMO | Admitting: Physical Therapy

## 2021-07-05 DIAGNOSIS — M6281 Muscle weakness (generalized): Secondary | ICD-10-CM

## 2021-07-05 DIAGNOSIS — R293 Abnormal posture: Secondary | ICD-10-CM | POA: Diagnosis not present

## 2021-07-05 DIAGNOSIS — R279 Unspecified lack of coordination: Secondary | ICD-10-CM | POA: Diagnosis not present
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
## 2021-07-11 ENCOUNTER — Ambulatory Visit: Payer: Medicare HMO | Attending: Obstetrics and Gynecology | Admitting: Physical Therapy

## 2021-07-11 DIAGNOSIS — M6281 Muscle weakness (generalized): Secondary | ICD-10-CM | POA: Insufficient documentation

## 2021-07-11 DIAGNOSIS — R293 Abnormal posture: Secondary | ICD-10-CM | POA: Insufficient documentation

## 2021-07-11 DIAGNOSIS — R279 Unspecified lack of coordination: Secondary | ICD-10-CM | POA: Diagnosis not present
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
## 2021-07-13 DIAGNOSIS — K2100 Gastro-esophageal reflux disease with esophagitis, without bleeding: Secondary | ICD-10-CM | POA: Diagnosis not present

## 2021-07-13 DIAGNOSIS — E782 Mixed hyperlipidemia: Secondary | ICD-10-CM | POA: Diagnosis not present

## 2021-07-13 DIAGNOSIS — R7301 Impaired fasting glucose: Secondary | ICD-10-CM | POA: Diagnosis not present

## 2021-07-13 DIAGNOSIS — E7849 Other hyperlipidemia: Secondary | ICD-10-CM | POA: Diagnosis not present

## 2021-07-13 DIAGNOSIS — Z0001 Encounter for general adult medical examination with abnormal findings: Secondary | ICD-10-CM | POA: Diagnosis not present

## 2021-07-13 DIAGNOSIS — I10 Essential (primary) hypertension: Secondary | ICD-10-CM | POA: Diagnosis not present

## 2021-07-18 ENCOUNTER — Ambulatory Visit: Payer: Medicare HMO | Admitting: Physical Therapy

## 2021-07-18 DIAGNOSIS — R279 Unspecified lack of coordination: Secondary | ICD-10-CM

## 2021-07-18 DIAGNOSIS — R293 Abnormal posture: Secondary | ICD-10-CM | POA: Diagnosis not present

## 2021-07-18 DIAGNOSIS — F1190 Opioid use, unspecified, uncomplicated: Secondary | ICD-10-CM | POA: Diagnosis not present

## 2021-07-18 DIAGNOSIS — E7849 Other hyperlipidemia: Secondary | ICD-10-CM | POA: Diagnosis not present

## 2021-07-18 DIAGNOSIS — R0609 Other forms of dyspnea: Secondary | ICD-10-CM | POA: Diagnosis not present

## 2021-07-18 DIAGNOSIS — F331 Major depressive disorder, recurrent, moderate: Secondary | ICD-10-CM | POA: Diagnosis not present

## 2021-07-18 DIAGNOSIS — I700 Atherosclerosis of aorta: Secondary | ICD-10-CM | POA: Diagnosis not present

## 2021-07-18 DIAGNOSIS — M6281 Muscle weakness (generalized): Secondary | ICD-10-CM | POA: Diagnosis not present

## 2021-07-18 DIAGNOSIS — M797 Fibromyalgia: Secondary | ICD-10-CM | POA: Diagnosis not present

## 2021-07-18 DIAGNOSIS — J45909 Unspecified asthma, uncomplicated: Secondary | ICD-10-CM | POA: Diagnosis not present

## 2021-07-18 DIAGNOSIS — I10 Essential (primary) hypertension: Secondary | ICD-10-CM | POA: Diagnosis not present

## 2021-07-18 DIAGNOSIS — E041 Nontoxic single thyroid nodule: Secondary | ICD-10-CM | POA: Diagnosis not present
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
## 2021-07-22 DIAGNOSIS — R0609 Other forms of dyspnea: Secondary | ICD-10-CM | POA: Diagnosis not present

## 2021-07-22 DIAGNOSIS — I10 Essential (primary) hypertension: Secondary | ICD-10-CM | POA: Diagnosis not present

## 2021-07-25 ENCOUNTER — Encounter: Payer: Medicare HMO | Admitting: Physical Therapy

## 2021-07-25 DIAGNOSIS — J45909 Unspecified asthma, uncomplicated: Secondary | ICD-10-CM | POA: Diagnosis not present

## 2021-07-25 DIAGNOSIS — K219 Gastro-esophageal reflux disease without esophagitis: Secondary | ICD-10-CM | POA: Diagnosis not present

## 2021-07-25 DIAGNOSIS — G588 Other specified mononeuropathies: Secondary | ICD-10-CM | POA: Diagnosis not present

## 2021-07-25 DIAGNOSIS — Z888 Allergy status to other drugs, medicaments and biological substances status: Secondary | ICD-10-CM | POA: Diagnosis not present

## 2021-07-25 DIAGNOSIS — I10 Essential (primary) hypertension: Secondary | ICD-10-CM | POA: Diagnosis not present

## 2021-07-25 DIAGNOSIS — M47816 Spondylosis without myelopathy or radiculopathy, lumbar region: Secondary | ICD-10-CM | POA: Diagnosis not present

## 2021-07-25 DIAGNOSIS — M533 Sacrococcygeal disorders, not elsewhere classified: Secondary | ICD-10-CM | POA: Diagnosis not present

## 2021-07-25 DIAGNOSIS — F32A Depression, unspecified: Secondary | ICD-10-CM | POA: Diagnosis not present

## 2021-07-25 DIAGNOSIS — Z91040 Latex allergy status: Secondary | ICD-10-CM | POA: Diagnosis not present

## 2021-07-25 NOTE — Progress Notes (Signed)
[facility] ? ?PTNS VISIT ? ?CC:  Overactive bladder ? ?61 y.o. with refractory overactive bladder who presents for percutaneous tibial nerve stimulation. ?The patient presents for PTNS session # 1. ? ?She was given a bladder diary today to fill out over 3 days for baseline. She states she notices that her symptoms of incontinence are worse after taking her lasix in the morning.  ? ?Procedure: ?The patient spontaneously voided prior to beginning the procedure. The patient was placed in the sitting position and the right lower extremity was prepped in the usual fashion. The PTNS needle was then inserted at a 60 degree angle, 5 cm cephalad and 2 cm posterior to the medial malleolus. The PTNS unit was then programmed and an optimal response was noted at 7 milliamps. The PTNS stimulation was then performed at this setting for 30 minutes without incident and the patient tolerated the procedure well. The needle was removed and hemostasis was noted.  ? ?The pt will return in 1 week for PTNS session # 2. ?All questions were answered. ? ?

## 2021-07-26 ENCOUNTER — Encounter: Payer: Self-pay | Admitting: Obstetrics and Gynecology

## 2021-07-26 ENCOUNTER — Ambulatory Visit (INDEPENDENT_AMBULATORY_CARE_PROVIDER_SITE_OTHER): Payer: Medicare HMO | Admitting: Obstetrics and Gynecology

## 2021-07-26 VITALS — BP 152/86 | HR 67

## 2021-07-26 DIAGNOSIS — N3281 Overactive bladder: Secondary | ICD-10-CM | POA: Diagnosis not present

## 2021-07-27 ENCOUNTER — Ambulatory Visit: Payer: Medicare HMO | Admitting: Physical Therapy

## 2021-07-27 DIAGNOSIS — M6281 Muscle weakness (generalized): Secondary | ICD-10-CM

## 2021-07-27 DIAGNOSIS — R279 Unspecified lack of coordination: Secondary | ICD-10-CM | POA: Diagnosis not present

## 2021-07-27 DIAGNOSIS — R293 Abnormal posture: Secondary | ICD-10-CM | POA: Diagnosis not present
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
## 2021-08-01 ENCOUNTER — Encounter: Payer: Medicare HMO | Admitting: Physical Therapy

## 2021-08-01 NOTE — Progress Notes (Unsigned)
[facility]  PTNS VISIT  CC:  Overactive bladder  61 y.o. with refractory overactive bladder who presents for percutaneous tibial nerve stimulation. The patient presents for PTNS session # 2.  She filled out her baseline bladder diary but forgot to bring it to this session, she will bring next week.   Procedure: The patient spontaneously voided prior to beginning the procedure. The patient was placed in the sitting position and the right lower extremity was prepped in the usual fashion. The PTNS needle was then inserted at a 60 degree angle, 5 cm cephalad and 2 cm posterior to the medial malleolus. The PTNS unit was then programmed and an optimal response was noted at 11 milliamps. The PTNS stimulation was then performed at this setting for 30 minutes without incident and the patient tolerated the procedure well. The needle was removed and hemostasis was noted.   The pt will return in 1 week for PTNS session #3. All questions were answered.  Marguerita Beards, MD

## 2021-08-02 ENCOUNTER — Ambulatory Visit (INDEPENDENT_AMBULATORY_CARE_PROVIDER_SITE_OTHER): Payer: Medicare HMO | Admitting: Obstetrics and Gynecology

## 2021-08-02 ENCOUNTER — Encounter: Payer: Self-pay | Admitting: Obstetrics and Gynecology

## 2021-08-02 VITALS — BP 162/91 | HR 62

## 2021-08-02 DIAGNOSIS — H52223 Regular astigmatism, bilateral: Secondary | ICD-10-CM | POA: Diagnosis not present

## 2021-08-02 DIAGNOSIS — H524 Presbyopia: Secondary | ICD-10-CM | POA: Diagnosis not present

## 2021-08-02 DIAGNOSIS — N3281 Overactive bladder: Secondary | ICD-10-CM | POA: Diagnosis not present

## 2021-08-04 ENCOUNTER — Encounter: Payer: Self-pay | Admitting: Physical Therapy

## 2021-08-09 ENCOUNTER — Ambulatory Visit (INDEPENDENT_AMBULATORY_CARE_PROVIDER_SITE_OTHER): Payer: Medicare HMO | Admitting: Obstetrics and Gynecology

## 2021-08-09 ENCOUNTER — Encounter: Payer: Self-pay | Admitting: Obstetrics and Gynecology

## 2021-08-09 VITALS — BP 141/84 | HR 88 | Resp 12 | Ht 64.0 in | Wt 215.0 lb

## 2021-08-09 DIAGNOSIS — N3281 Overactive bladder: Secondary | ICD-10-CM | POA: Diagnosis not present

## 2021-08-09 NOTE — Progress Notes (Signed)
[facility]  PTNS VISIT  CC:  Overactive bladder  61 y.o. with refractory overactive bladder who presents for percutaneous tibial nerve stimulation. The patient presents for PTNS session # 3.  Baseline bladder diary scanned into epic  Procedure: The patient spontaneously voided prior to beginning the procedure. The patient was placed in the sitting position and the right lower extremity was prepped in the usual fashion. The PTNS needle was then inserted at a 60 degree angle, 5 cm cephalad and 2 cm posterior to the medial malleolus. The PTNS unit was then programmed and an optimal response was noted at 8 milliamps. The PTNS stimulation was then performed at this setting for 30 minutes without incident and the patient tolerated the procedure well. The needle was removed and hemostasis was noted.   The pt will return in 1 week for PTNS session #4. All questions were answered.  Jaquita Folds, MD

## 2021-08-16 ENCOUNTER — Ambulatory Visit (INDEPENDENT_AMBULATORY_CARE_PROVIDER_SITE_OTHER): Payer: Medicare HMO | Admitting: Obstetrics and Gynecology

## 2021-08-16 ENCOUNTER — Encounter: Payer: Self-pay | Admitting: Obstetrics and Gynecology

## 2021-08-16 VITALS — BP 132/78 | HR 78

## 2021-08-16 DIAGNOSIS — N3281 Overactive bladder: Secondary | ICD-10-CM

## 2021-08-16 NOTE — Progress Notes (Signed)
[facility]  PTNS VISIT  CC:  Overactive bladder  61 y.o. with refractory overactive bladder who presents for percutaneous tibial nerve stimulation. The patient presents for PTNS session # 4.  She has noticed some improvement with her leakage.   Procedure: The patient spontaneously voided prior to beginning the procedure. The patient was placed in the sitting position and the right lower extremity was prepped in the usual fashion. The PTNS needle was then inserted at a 60 degree angle, 5 cm cephalad and 2 cm posterior to the medial malleolus. The PTNS unit was then programmed and an optimal response was noted at 14 milliamps. The PTNS stimulation was then performed at this setting for 30 minutes without incident and the patient tolerated the procedure well. The needle was removed and hemostasis was noted.   The pt will return in 1 week for PTNS session #5   Jaquita Folds, MD

## 2021-08-23 DIAGNOSIS — M5459 Other low back pain: Secondary | ICD-10-CM | POA: Diagnosis not present

## 2021-08-23 DIAGNOSIS — G894 Chronic pain syndrome: Secondary | ICD-10-CM | POA: Diagnosis not present

## 2021-08-23 DIAGNOSIS — Z7901 Long term (current) use of anticoagulants: Secondary | ICD-10-CM | POA: Diagnosis not present

## 2021-08-23 DIAGNOSIS — K219 Gastro-esophageal reflux disease without esophagitis: Secondary | ICD-10-CM | POA: Diagnosis not present

## 2021-08-23 DIAGNOSIS — Z86718 Personal history of other venous thrombosis and embolism: Secondary | ICD-10-CM | POA: Diagnosis not present

## 2021-08-23 DIAGNOSIS — M47816 Spondylosis without myelopathy or radiculopathy, lumbar region: Secondary | ICD-10-CM | POA: Diagnosis not present

## 2021-08-23 DIAGNOSIS — M797 Fibromyalgia: Secondary | ICD-10-CM | POA: Diagnosis not present

## 2021-08-23 DIAGNOSIS — J45909 Unspecified asthma, uncomplicated: Secondary | ICD-10-CM | POA: Diagnosis not present

## 2021-08-23 DIAGNOSIS — F32A Depression, unspecified: Secondary | ICD-10-CM | POA: Diagnosis not present

## 2021-08-23 DIAGNOSIS — Z79899 Other long term (current) drug therapy: Secondary | ICD-10-CM | POA: Diagnosis not present

## 2021-08-24 ENCOUNTER — Ambulatory Visit (INDEPENDENT_AMBULATORY_CARE_PROVIDER_SITE_OTHER): Payer: Medicare HMO | Admitting: Obstetrics and Gynecology

## 2021-08-24 ENCOUNTER — Encounter: Payer: Self-pay | Admitting: Obstetrics and Gynecology

## 2021-08-24 VITALS — BP 131/70 | HR 75

## 2021-08-24 DIAGNOSIS — N3281 Overactive bladder: Secondary | ICD-10-CM

## 2021-08-24 NOTE — Progress Notes (Signed)
[facility]  PTNS VISIT  CC:  Overactive bladder  61 y.o. with refractory overactive bladder who presents for percutaneous tibial nerve stimulation. The patient presents for PTNS session # 5.   Procedure: The patient spontaneously voided prior to beginning the procedure. The patient was placed in the sitting position and the right lower extremity was prepped in the usual fashion. The PTNS needle was then inserted at a 60 degree angle, 5 cm cephalad and 2 cm posterior to the medial malleolus. The PTNS unit was then programmed and an optimal response was noted at 9 milliamps. The PTNS stimulation was then performed at this setting for 30 minutes without incident and the patient tolerated the procedure well. The needle was removed and hemostasis was noted.   The pt will return in 1 week for PTNS session #6   Jaquita Folds, MD

## 2021-08-30 ENCOUNTER — Ambulatory Visit: Payer: Medicare HMO | Admitting: Obstetrics and Gynecology

## 2021-09-05 DIAGNOSIS — H04123 Dry eye syndrome of bilateral lacrimal glands: Secondary | ICD-10-CM | POA: Diagnosis not present

## 2021-09-06 ENCOUNTER — Encounter: Payer: Self-pay | Admitting: Obstetrics and Gynecology

## 2021-09-06 ENCOUNTER — Ambulatory Visit (INDEPENDENT_AMBULATORY_CARE_PROVIDER_SITE_OTHER): Payer: Medicare HMO | Admitting: Obstetrics and Gynecology

## 2021-09-06 VITALS — BP 117/75 | HR 71

## 2021-09-06 DIAGNOSIS — N3281 Overactive bladder: Secondary | ICD-10-CM

## 2021-09-21 ENCOUNTER — Encounter: Payer: Self-pay | Admitting: Obstetrics and Gynecology

## 2021-09-21 ENCOUNTER — Ambulatory Visit (INDEPENDENT_AMBULATORY_CARE_PROVIDER_SITE_OTHER): Payer: Medicare HMO | Admitting: Obstetrics and Gynecology

## 2021-09-21 VITALS — BP 137/83 | HR 65

## 2021-09-21 DIAGNOSIS — N3281 Overactive bladder: Secondary | ICD-10-CM

## 2021-09-21 NOTE — Progress Notes (Signed)
[facility]  PTNS VISIT  CC:  Overactive bladder  61 y.o. with refractory overactive bladder who presents for percutaneous tibial nerve stimulation. The patient presents for PTNS session # 7   Procedure: The patient spontaneously voided prior to beginning the procedure. The patient was placed in the sitting position and the right lower extremity was prepped in the usual fashion. The PTNS needle was then inserted at a 60 degree angle, 5 cm cephalad and 2 cm posterior to the medial malleolus. The PTNS unit was then programmed and an optimal response was noted at 11 milliamps. The PTNS stimulation was then performed at this setting for 30 minutes without incident and the patient tolerated the procedure well. The needle was removed and hemostasis was noted.   The pt will return in 1 week for PTNS session #8.  Will have her fill out a bladder diary before next visit to assess progress.    Jaquita Folds, MD

## 2021-09-27 ENCOUNTER — Encounter: Payer: Self-pay | Admitting: Obstetrics and Gynecology

## 2021-09-27 ENCOUNTER — Ambulatory Visit (INDEPENDENT_AMBULATORY_CARE_PROVIDER_SITE_OTHER): Payer: Medicare HMO | Admitting: Obstetrics and Gynecology

## 2021-09-27 VITALS — BP 113/77 | HR 66

## 2021-09-27 DIAGNOSIS — R350 Frequency of micturition: Secondary | ICD-10-CM

## 2021-09-27 LAB — POCT URINALYSIS DIPSTICK
Bilirubin, UA: NEGATIVE
Blood, UA: NEGATIVE
Glucose, UA: NEGATIVE
Ketones, UA: NEGATIVE
Leukocytes, UA: NEGATIVE
Nitrite, UA: NEGATIVE
Protein, UA: NEGATIVE
Spec Grav, UA: >=1.03 — AB (ref 1.010–1.025)
Urobilinogen, UA: 0.2 E.U./dL
pH, UA: 6 (ref 5.0–8.0)

## 2021-09-27 NOTE — Progress Notes (Signed)
[facility]  PTNS VISIT  CC:  Overactive bladder  Today's Vitals   09/27/21 1041  BP: 113/77  Pulse: 66    61 y.o. with refractory overactive bladder who presents for percutaneous tibial nerve stimulation. The patient presents for PTNS session # 8.   This last week, she has noticed increased leakage. Has some urgency with urination.   POC urine today negative.    Procedure: The patient spontaneously voided prior to beginning the procedure. The patient was placed in the sitting position and the right lower extremity was prepped in the usual fashion. The PTNS needle was then inserted at a 60 degree angle, 5 cm cephalad and 2 cm posterior to the medial malleolus. The PTNS unit was then programmed and an optimal response was noted at 11 milliamps. The PTNS stimulation was then performed at this setting for 30 minutes without incident and the patient tolerated the procedure well. The needle was removed and hemostasis was noted.   The pt will return in 1 week for PTNS session #9.    Theresa Folds, MD

## 2021-10-03 DIAGNOSIS — M47812 Spondylosis without myelopathy or radiculopathy, cervical region: Secondary | ICD-10-CM | POA: Diagnosis not present

## 2021-10-03 DIAGNOSIS — G894 Chronic pain syndrome: Secondary | ICD-10-CM | POA: Diagnosis not present

## 2021-10-03 DIAGNOSIS — Z79891 Long term (current) use of opiate analgesic: Secondary | ICD-10-CM | POA: Diagnosis not present

## 2021-10-03 DIAGNOSIS — M5136 Other intervertebral disc degeneration, lumbar region: Secondary | ICD-10-CM | POA: Diagnosis not present

## 2021-10-03 DIAGNOSIS — M5416 Radiculopathy, lumbar region: Secondary | ICD-10-CM | POA: Diagnosis not present

## 2021-10-04 ENCOUNTER — Encounter: Payer: Self-pay | Admitting: Obstetrics and Gynecology

## 2021-10-04 ENCOUNTER — Ambulatory Visit (INDEPENDENT_AMBULATORY_CARE_PROVIDER_SITE_OTHER): Payer: Medicare HMO | Admitting: Obstetrics and Gynecology

## 2021-10-04 VITALS — BP 128/74 | HR 60

## 2021-10-04 DIAGNOSIS — N3281 Overactive bladder: Secondary | ICD-10-CM | POA: Diagnosis not present

## 2021-10-04 NOTE — Progress Notes (Signed)
[facility]  PTNS VISIT  CC:  Overactive bladder  Today's Vitals   10/04/21 1033  BP: 128/74  Pulse: 60    61 y.o. with refractory overactive bladder who presents for percutaneous tibial nerve stimulation. The patient presents for PTNS session # 9.  Urgency has improved this last week.   Procedure:  The patient was placed in the sitting position and the right lower extremity was prepped in the usual fashion. The PTNS needle was then inserted at a 60 degree angle, 5 cm cephalad and 2 cm posterior to the medial malleolus. The PTNS unit was then programmed and an optimal response was noted at 9 milliamps. The PTNS stimulation was then performed at this setting for 30 minutes without incident and the patient tolerated the procedure well. The needle was removed and hemostasis was noted.   The pt will return in 1 week for PTNS session #10.    Jaquita Folds, MD

## 2021-10-11 ENCOUNTER — Encounter: Payer: Self-pay | Admitting: Obstetrics and Gynecology

## 2021-10-11 ENCOUNTER — Ambulatory Visit (INDEPENDENT_AMBULATORY_CARE_PROVIDER_SITE_OTHER): Payer: Medicare HMO | Admitting: Obstetrics and Gynecology

## 2021-10-11 VITALS — BP 134/77 | HR 59

## 2021-10-11 DIAGNOSIS — N3281 Overactive bladder: Secondary | ICD-10-CM

## 2021-10-11 NOTE — Progress Notes (Signed)
[facility]  PTNS VISIT  CC:  Overactive bladder  Today's Vitals   10/11/21 1039  BP: 134/77  Pulse: (!) 63    61 y.o. with refractory overactive bladder who presents for percutaneous tibial nerve stimulation. The patient presents for PTNS session # 10.   Procedure:  The patient was placed in the sitting position and the right lower extremity was prepped in the usual fashion. The PTNS needle was then inserted at a 60 degree angle, 5 cm cephalad and 2 cm posterior to the medial malleolus. The PTNS unit was then programmed and an optimal response was noted at 16 milliamps. The PTNS stimulation was then performed at this setting for 30 minutes without incident and the patient tolerated the procedure well. The needle was removed and hemostasis was noted.   The pt will return in 1 week for PTNS session #11.    Jaquita Folds, MD

## 2021-10-19 ENCOUNTER — Encounter: Payer: Self-pay | Admitting: Obstetrics and Gynecology

## 2021-10-19 ENCOUNTER — Ambulatory Visit (INDEPENDENT_AMBULATORY_CARE_PROVIDER_SITE_OTHER): Payer: Medicare HMO | Admitting: Obstetrics and Gynecology

## 2021-10-19 VITALS — BP 124/61 | HR 66

## 2021-10-19 DIAGNOSIS — N3281 Overactive bladder: Secondary | ICD-10-CM

## 2021-10-19 NOTE — Progress Notes (Signed)
[facility]  PTNS VISIT  CC:  Overactive bladder  Today's Vitals   10/19/21 1032  BP: 124/61  Pulse: 66    61 y.o. with refractory overactive bladder who presents for percutaneous tibial nerve stimulation. The patient presents for PTNS session # 11.   Procedure:  The patient was placed in the sitting position and the left lower extremity was prepped in the usual fashion. The PTNS needle was then inserted at a 60 degree angle, 5 cm cephalad and 2 cm posterior to the medial malleolus. The PTNS unit was then programmed and an optimal response was noted at 6 milliamps. The PTNS stimulation was then performed at this setting for 30 minutes without incident and the patient tolerated the procedure well. The needle was removed and hemostasis was noted.   The pt will return in 1 week for PTNS session #12.    Jaquita Folds, MD

## 2021-10-25 ENCOUNTER — Ambulatory Visit: Payer: Medicare HMO | Admitting: Obstetrics and Gynecology

## 2021-11-01 DIAGNOSIS — M5116 Intervertebral disc disorders with radiculopathy, lumbar region: Secondary | ICD-10-CM | POA: Diagnosis not present

## 2021-11-01 DIAGNOSIS — Z86718 Personal history of other venous thrombosis and embolism: Secondary | ICD-10-CM | POA: Diagnosis not present

## 2021-11-01 DIAGNOSIS — Z9109 Other allergy status, other than to drugs and biological substances: Secondary | ICD-10-CM | POA: Diagnosis not present

## 2021-11-01 DIAGNOSIS — Z91040 Latex allergy status: Secondary | ICD-10-CM | POA: Diagnosis not present

## 2021-11-01 DIAGNOSIS — M4692 Unspecified inflammatory spondylopathy, cervical region: Secondary | ICD-10-CM | POA: Diagnosis not present

## 2021-11-01 DIAGNOSIS — M47812 Spondylosis without myelopathy or radiculopathy, cervical region: Secondary | ICD-10-CM | POA: Diagnosis not present

## 2021-11-01 DIAGNOSIS — Z79899 Other long term (current) drug therapy: Secondary | ICD-10-CM | POA: Diagnosis not present

## 2021-11-04 ENCOUNTER — Ambulatory Visit: Payer: Medicare HMO | Admitting: Obstetrics and Gynecology

## 2021-11-21 ENCOUNTER — Encounter: Payer: Self-pay | Admitting: Obstetrics and Gynecology

## 2021-11-21 ENCOUNTER — Ambulatory Visit (INDEPENDENT_AMBULATORY_CARE_PROVIDER_SITE_OTHER): Payer: Medicare HMO | Admitting: Obstetrics and Gynecology

## 2021-11-21 VITALS — BP 146/89 | HR 70

## 2021-11-21 DIAGNOSIS — N3281 Overactive bladder: Secondary | ICD-10-CM | POA: Diagnosis not present

## 2021-11-21 MED ORDER — DIAZEPAM 5 MG PO TABS: ORAL_TABLET | ORAL | 0 refills | Status: DC

## 2021-11-21 MED ORDER — SULFAMETHOXAZOLE-TRIMETHOPRIM 800-160 MG PO TABS: 1.0000 | ORAL_TABLET | Freq: Two times a day (BID) | ORAL | 0 refills | Status: DC
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
## 2021-11-22 DIAGNOSIS — M5116 Intervertebral disc disorders with radiculopathy, lumbar region: Secondary | ICD-10-CM | POA: Diagnosis not present

## 2021-11-22 DIAGNOSIS — D720 Genetic anomalies of leukocytes: Secondary | ICD-10-CM | POA: Diagnosis not present

## 2021-11-22 DIAGNOSIS — G894 Chronic pain syndrome: Secondary | ICD-10-CM | POA: Diagnosis not present

## 2021-11-22 DIAGNOSIS — Z888 Allergy status to other drugs, medicaments and biological substances status: Secondary | ICD-10-CM | POA: Diagnosis not present

## 2021-11-22 DIAGNOSIS — Z91040 Latex allergy status: Secondary | ICD-10-CM | POA: Diagnosis not present

## 2021-11-22 DIAGNOSIS — Z7901 Long term (current) use of anticoagulants: Secondary | ICD-10-CM | POA: Diagnosis not present

## 2021-11-22 DIAGNOSIS — Z79899 Other long term (current) drug therapy: Secondary | ICD-10-CM | POA: Diagnosis not present

## 2021-11-22 DIAGNOSIS — I10 Essential (primary) hypertension: Secondary | ICD-10-CM | POA: Diagnosis not present

## 2021-11-22 DIAGNOSIS — M5416 Radiculopathy, lumbar region: Secondary | ICD-10-CM | POA: Diagnosis not present

## 2021-11-22 DIAGNOSIS — M47816 Spondylosis without myelopathy or radiculopathy, lumbar region: Secondary | ICD-10-CM | POA: Diagnosis not present

## 2021-11-23 ENCOUNTER — Encounter: Payer: Self-pay | Admitting: Obstetrics and Gynecology

## 2021-11-24 NOTE — Progress Notes (Unsigned)
PA attempt made through [facility] on the provider portal to check CPT codes for procedure :  Cysto w/ Botox  CPT code: [phone]  PA needed for this procedure- PENDING Ref#: [id number]  [facility] confirmation has been scanned.

## 2021-12-02 DIAGNOSIS — J300 Vasomotor rhinitis: Secondary | ICD-10-CM | POA: Diagnosis not present

## 2021-12-02 DIAGNOSIS — J209 Acute bronchitis, unspecified: Secondary | ICD-10-CM | POA: Diagnosis not present

## 2021-12-02 DIAGNOSIS — H1045 Other chronic allergic conjunctivitis: Secondary | ICD-10-CM | POA: Diagnosis not present

## 2021-12-02 DIAGNOSIS — J4540 Moderate persistent asthma, uncomplicated: Secondary | ICD-10-CM | POA: Diagnosis not present

## 2021-12-07 ENCOUNTER — Encounter: Payer: Self-pay | Admitting: Obstetrics and Gynecology

## 2021-12-13 DIAGNOSIS — J322 Chronic ethmoidal sinusitis: Secondary | ICD-10-CM | POA: Diagnosis not present

## 2021-12-13 DIAGNOSIS — J310 Chronic rhinitis: Secondary | ICD-10-CM | POA: Diagnosis not present

## 2021-12-13 DIAGNOSIS — J320 Chronic maxillary sinusitis: Secondary | ICD-10-CM | POA: Diagnosis not present

## 2021-12-26 DIAGNOSIS — K219 Gastro-esophageal reflux disease without esophagitis: Secondary | ICD-10-CM | POA: Diagnosis not present

## 2021-12-26 DIAGNOSIS — R7301 Impaired fasting glucose: Secondary | ICD-10-CM | POA: Diagnosis not present

## 2021-12-26 DIAGNOSIS — R946 Abnormal results of thyroid function studies: Secondary | ICD-10-CM | POA: Diagnosis not present

## 2021-12-26 DIAGNOSIS — I10 Essential (primary) hypertension: Secondary | ICD-10-CM | POA: Diagnosis not present

## 2021-12-26 DIAGNOSIS — E7849 Other hyperlipidemia: Secondary | ICD-10-CM | POA: Diagnosis not present

## 2021-12-28 DIAGNOSIS — I700 Atherosclerosis of aorta: Secondary | ICD-10-CM | POA: Diagnosis not present

## 2021-12-28 DIAGNOSIS — I10 Essential (primary) hypertension: Secondary | ICD-10-CM | POA: Diagnosis not present

## 2021-12-28 DIAGNOSIS — J45909 Unspecified asthma, uncomplicated: Secondary | ICD-10-CM | POA: Diagnosis not present

## 2021-12-28 DIAGNOSIS — R7301 Impaired fasting glucose: Secondary | ICD-10-CM | POA: Diagnosis not present

## 2021-12-28 DIAGNOSIS — F331 Major depressive disorder, recurrent, moderate: Secondary | ICD-10-CM | POA: Diagnosis not present

## 2021-12-28 DIAGNOSIS — Z0001 Encounter for general adult medical examination with abnormal findings: Secondary | ICD-10-CM | POA: Diagnosis not present

## 2021-12-28 DIAGNOSIS — E7849 Other hyperlipidemia: Secondary | ICD-10-CM | POA: Diagnosis not present

## 2021-12-28 DIAGNOSIS — M797 Fibromyalgia: Secondary | ICD-10-CM | POA: Diagnosis not present

## 2021-12-28 DIAGNOSIS — F1190 Opioid use, unspecified, uncomplicated: Secondary | ICD-10-CM | POA: Diagnosis not present

## 2021-12-29 ENCOUNTER — Ambulatory Visit (INDEPENDENT_AMBULATORY_CARE_PROVIDER_SITE_OTHER): Payer: Medicare HMO | Admitting: Obstetrics and Gynecology

## 2021-12-29 ENCOUNTER — Ambulatory Visit (INDEPENDENT_AMBULATORY_CARE_PROVIDER_SITE_OTHER): Payer: Medicare HMO

## 2021-12-29 DIAGNOSIS — N3281 Overactive bladder: Secondary | ICD-10-CM | POA: Diagnosis not present

## 2021-12-29 DIAGNOSIS — Z7189 Other specified counseling: Secondary | ICD-10-CM

## 2021-12-29 NOTE — Progress Notes (Signed)
Intravesical Botox Procedure:  Prior to the procedure, the patient took bactrim for antibiotic prophylaxis. Time out was performed. The bladder was catherized and 50 ml of 2% lidocaine was placed in the bladder and 10 ml of 2% lidocaine jelly placed in the urethra. After 30 minutes the lidocaine was drained. Cystoscopy was performed with sterile H2O and a 70 degree scope. Bladder mucosa was noted to be within normal limits. A total of 18m (100 units) of Botox A,  Lot # [id number] 01/2024  was injected in the detrusor muscle via 5 injections of 26meach. These were spaced about 1 cm apart. Care was taken to avoid the ureteral orifices and the trigone. Patient tolerated the procedure well. The patient voided spontaneously prior to leaving.  Impression: 89.o. s/p cystoscopic injection of BOTOX A for detrusor overactivity.   Plan: Post-procedure instructions given regarding bleeding, infection, urinary retention.  Self-catheterization teaching was previously performed.   Patient will follow up in 4 weeks All questions answered.  MiJaquita FoldsMD

## 2021-12-29 NOTE — Patient Instructions (Signed)
Taking Care of Yourself after Botox Injection   Drink plenty of water for a day or two following your procedure. Try to have about 8 ounces (one cup) at a time, and do this 6 times or more per day unless you have fluid restrictitons AVOID irritative beverages such as coffee, tea, soda, alcoholic or citrus drinks for a day or two, as this may cause burning with urination.  For the first 1-2 days after the procedure, your urine may be pink or red in color. You may have some blood in your urine as a normal side effect of the procedure. Large amounts of bleeding or difficulty urinating are NOT normal. Call the nurse line if this happens or go to the nearest Emergency Room if the bleeding is heavy or you cannot urinate at all and it is after hours.  You may experience some discomfort or a burning sensation with urination after having this procedure. You can use over the counter Azo or pyridium to help with burning and follow the instructions on the packaging. If it does not improve within 1-2 days, or other symptoms appear (fever, chills, or difficulty urinating) call the office to speak to a nurse.  You may return to normal daily activities such as work, school, driving, exercising and housework on the day of the procedure. If your doctor gave you a prescription, take it as ordered.

## 2021-12-30 NOTE — Progress Notes (Signed)
Theresa Lucas is a 61 y.o. female here for a self-cath teaching. I demonstrated on the pt using a mirror how to use a 85f cath to drain urine from the bladder. Pt was able to demonstrate successfully using the catheter and verbalized understanding. Pt was given a self-cath bag with several catheters, lubricant, a mirror, measuring hat.

## 2022-01-03 DIAGNOSIS — J4540 Moderate persistent asthma, uncomplicated: Secondary | ICD-10-CM | POA: Diagnosis not present

## 2022-01-03 DIAGNOSIS — K219 Gastro-esophageal reflux disease without esophagitis: Secondary | ICD-10-CM | POA: Diagnosis not present

## 2022-01-03 DIAGNOSIS — J300 Vasomotor rhinitis: Secondary | ICD-10-CM | POA: Diagnosis not present

## 2022-01-03 DIAGNOSIS — M5136 Other intervertebral disc degeneration, lumbar region: Secondary | ICD-10-CM | POA: Diagnosis not present

## 2022-01-03 DIAGNOSIS — H1045 Other chronic allergic conjunctivitis: Secondary | ICD-10-CM | POA: Diagnosis not present

## 2022-01-03 DIAGNOSIS — M47816 Spondylosis without myelopathy or radiculopathy, lumbar region: Secondary | ICD-10-CM | POA: Diagnosis not present

## 2022-01-05 DIAGNOSIS — Z1231 Encounter for screening mammogram for malignant neoplasm of breast: Secondary | ICD-10-CM | POA: Diagnosis not present

## 2022-02-06 DIAGNOSIS — X32XXXA Exposure to sunlight, initial encounter: Secondary | ICD-10-CM | POA: Diagnosis not present

## 2022-02-06 DIAGNOSIS — L570 Actinic keratosis: Secondary | ICD-10-CM | POA: Diagnosis not present

## 2022-02-06 MED ORDER — ONABOTULINUMTOXINA 100 UNITS IJ SOLR
100.0000 [IU] | Freq: Once | INTRAMUSCULAR | Status: AC
  Administered 2021-12-29: 100 [IU] via INTRAMUSCULAR

## 2022-02-06 NOTE — Addendum Note (Signed)
Addended by: Alen Blew on: 02/06/2022 02:59 PM   Modules accepted: Orders

## 2022-02-07 ENCOUNTER — Ambulatory Visit: Payer: Medicare HMO | Admitting: Obstetrics and Gynecology

## 2022-02-17 ENCOUNTER — Ambulatory Visit (INDEPENDENT_AMBULATORY_CARE_PROVIDER_SITE_OTHER): Payer: Medicare HMO | Admitting: Obstetrics and Gynecology

## 2022-02-17 ENCOUNTER — Encounter: Payer: Self-pay | Admitting: Obstetrics and Gynecology

## 2022-02-17 VITALS — BP 120/54 | HR 63

## 2022-02-17 DIAGNOSIS — N393 Stress incontinence (female) (male): Secondary | ICD-10-CM

## 2022-02-17 DIAGNOSIS — N3281 Overactive bladder: Secondary | ICD-10-CM | POA: Diagnosis not present
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
## 2022-02-21 DIAGNOSIS — I10 Essential (primary) hypertension: Secondary | ICD-10-CM | POA: Diagnosis not present

## 2022-02-21 DIAGNOSIS — Z7901 Long term (current) use of anticoagulants: Secondary | ICD-10-CM | POA: Diagnosis not present

## 2022-02-21 DIAGNOSIS — Z79899 Other long term (current) drug therapy: Secondary | ICD-10-CM | POA: Diagnosis not present

## 2022-02-21 DIAGNOSIS — G894 Chronic pain syndrome: Secondary | ICD-10-CM | POA: Diagnosis not present

## 2022-02-21 DIAGNOSIS — Z86711 Personal history of pulmonary embolism: Secondary | ICD-10-CM | POA: Diagnosis not present

## 2022-02-21 DIAGNOSIS — J45909 Unspecified asthma, uncomplicated: Secondary | ICD-10-CM | POA: Diagnosis not present

## 2022-02-21 DIAGNOSIS — Z86718 Personal history of other venous thrombosis and embolism: Secondary | ICD-10-CM | POA: Diagnosis not present

## 2022-02-21 DIAGNOSIS — K219 Gastro-esophageal reflux disease without esophagitis: Secondary | ICD-10-CM | POA: Diagnosis not present

## 2022-02-21 DIAGNOSIS — M47816 Spondylosis without myelopathy or radiculopathy, lumbar region: Secondary | ICD-10-CM | POA: Diagnosis not present

## 2022-02-27 DIAGNOSIS — Z91040 Latex allergy status: Secondary | ICD-10-CM | POA: Diagnosis not present

## 2022-02-27 DIAGNOSIS — M5136 Other intervertebral disc degeneration, lumbar region: Secondary | ICD-10-CM | POA: Diagnosis not present

## 2022-02-27 DIAGNOSIS — Z79899 Other long term (current) drug therapy: Secondary | ICD-10-CM | POA: Diagnosis not present

## 2022-02-27 DIAGNOSIS — G894 Chronic pain syndrome: Secondary | ICD-10-CM | POA: Diagnosis not present

## 2022-02-27 DIAGNOSIS — Z888 Allergy status to other drugs, medicaments and biological substances status: Secondary | ICD-10-CM | POA: Diagnosis not present

## 2022-02-27 DIAGNOSIS — Z7901 Long term (current) use of anticoagulants: Secondary | ICD-10-CM | POA: Diagnosis not present

## 2022-02-27 DIAGNOSIS — M48061 Spinal stenosis, lumbar region without neurogenic claudication: Secondary | ICD-10-CM | POA: Diagnosis not present

## 2022-02-27 DIAGNOSIS — J45909 Unspecified asthma, uncomplicated: Secondary | ICD-10-CM | POA: Diagnosis not present

## 2022-02-27 DIAGNOSIS — D720 Genetic anomalies of leukocytes: Secondary | ICD-10-CM | POA: Diagnosis not present

## 2022-03-16 DIAGNOSIS — X32XXXD Exposure to sunlight, subsequent encounter: Secondary | ICD-10-CM | POA: Diagnosis not present

## 2022-03-16 DIAGNOSIS — L818 Other specified disorders of pigmentation: Secondary | ICD-10-CM | POA: Diagnosis not present

## 2022-03-16 DIAGNOSIS — L570 Actinic keratosis: Secondary | ICD-10-CM | POA: Diagnosis not present

## 2022-03-24 DIAGNOSIS — Z6832 Body mass index (BMI) 32.0-32.9, adult: Secondary | ICD-10-CM | POA: Diagnosis not present

## 2022-03-24 DIAGNOSIS — R030 Elevated blood-pressure reading, without diagnosis of hypertension: Secondary | ICD-10-CM | POA: Diagnosis not present

## 2022-03-24 DIAGNOSIS — G510 Bell's palsy: Secondary | ICD-10-CM | POA: Diagnosis not present

## 2022-03-27 DIAGNOSIS — J45909 Unspecified asthma, uncomplicated: Secondary | ICD-10-CM | POA: Diagnosis not present

## 2022-03-27 DIAGNOSIS — I10 Essential (primary) hypertension: Secondary | ICD-10-CM | POA: Diagnosis not present

## 2022-03-27 DIAGNOSIS — F1190 Opioid use, unspecified, uncomplicated: Secondary | ICD-10-CM | POA: Diagnosis not present

## 2022-03-27 DIAGNOSIS — M5136 Other intervertebral disc degeneration, lumbar region: Secondary | ICD-10-CM | POA: Diagnosis not present

## 2022-03-27 DIAGNOSIS — I700 Atherosclerosis of aorta: Secondary | ICD-10-CM | POA: Diagnosis not present

## 2022-03-27 DIAGNOSIS — E7849 Other hyperlipidemia: Secondary | ICD-10-CM | POA: Diagnosis not present

## 2022-03-27 DIAGNOSIS — R7301 Impaired fasting glucose: Secondary | ICD-10-CM | POA: Diagnosis not present

## 2022-03-27 DIAGNOSIS — F331 Major depressive disorder, recurrent, moderate: Secondary | ICD-10-CM | POA: Diagnosis not present

## 2022-03-27 DIAGNOSIS — M797 Fibromyalgia: Secondary | ICD-10-CM | POA: Diagnosis not present

## 2022-04-04 DIAGNOSIS — H02402 Unspecified ptosis of left eyelid: Secondary | ICD-10-CM | POA: Diagnosis not present

## 2022-04-12 ENCOUNTER — Encounter: Payer: Self-pay | Admitting: Neurology

## 2022-04-18 DIAGNOSIS — H538 Other visual disturbances: Secondary | ICD-10-CM | POA: Diagnosis not present

## 2022-04-18 DIAGNOSIS — H02402 Unspecified ptosis of left eyelid: Secondary | ICD-10-CM | POA: Diagnosis not present
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
## 2022-04-19 ENCOUNTER — Encounter: Payer: Self-pay | Admitting: Neurology

## 2022-04-19 ENCOUNTER — Ambulatory Visit (INDEPENDENT_AMBULATORY_CARE_PROVIDER_SITE_OTHER): Payer: Medicare HMO | Admitting: Neurology

## 2022-04-19 ENCOUNTER — Other Ambulatory Visit (INDEPENDENT_AMBULATORY_CARE_PROVIDER_SITE_OTHER): Payer: Medicare HMO

## 2022-04-19 VITALS — BP 117/55 | HR 87 | Ht 64.0 in | Wt 195.0 lb

## 2022-04-19 DIAGNOSIS — M47812 Spondylosis without myelopathy or radiculopathy, cervical region: Secondary | ICD-10-CM | POA: Diagnosis not present

## 2022-04-19 DIAGNOSIS — Z79891 Long term (current) use of opiate analgesic: Secondary | ICD-10-CM | POA: Diagnosis not present

## 2022-04-19 DIAGNOSIS — G894 Chronic pain syndrome: Secondary | ICD-10-CM | POA: Diagnosis not present

## 2022-04-19 DIAGNOSIS — H532 Diplopia: Secondary | ICD-10-CM

## 2022-04-19 DIAGNOSIS — Z1330 Encounter for screening examination for mental health and behavioral disorders, unspecified: Secondary | ICD-10-CM | POA: Diagnosis not present

## 2022-04-19 DIAGNOSIS — M797 Fibromyalgia: Secondary | ICD-10-CM

## 2022-04-19 DIAGNOSIS — H02402 Unspecified ptosis of left eyelid: Secondary | ICD-10-CM

## 2022-04-19 DIAGNOSIS — M5136 Other intervertebral disc degeneration, lumbar region: Secondary | ICD-10-CM | POA: Diagnosis not present

## 2022-04-19 DIAGNOSIS — R0600 Dyspnea, unspecified: Secondary | ICD-10-CM

## 2022-04-19 DIAGNOSIS — G7000 Myasthenia gravis without (acute) exacerbation: Secondary | ICD-10-CM | POA: Diagnosis not present

## 2022-04-19 LAB — TSH: TSH: 0.81 u[IU]/mL (ref 0.35–5.50)

## 2022-04-19 LAB — VITAMIN D 25 HYDROXY (VIT D DEFICIENCY, FRACTURES): VITD: 13.55 ng/mL — ABNORMAL LOW (ref 30.00–100.00)

## 2022-04-19 MED ORDER — PYRIDOSTIGMINE BROMIDE 60 MG PO TABS: 60.0000 mg | ORAL_TABLET | Freq: Three times a day (TID) | ORAL | 3 refills | Status: DC | PRN

## 2022-04-19 MED ORDER — PREDNISONE 10 MG PO TABS: 10.0000 mg | ORAL_TABLET | Freq: Every day | ORAL | 1 refills | Status: DC

## 2022-04-19 NOTE — Addendum Note (Signed)
Addended by: Renae Gloss on: 04/19/2022 02:15 PM   Modules accepted: Orders

## 2022-04-19 NOTE — Addendum Note (Signed)
Addended by: Renae Gloss on: 04/19/2022 02:06 PM   Modules accepted: Orders

## 2022-04-19 NOTE — Addendum Note (Signed)
Addended by: Renae Gloss on: 04/19/2022 02:02 PM   Modules accepted: Orders

## 2022-04-19 NOTE — Patient Instructions (Signed)
I saw you today for myasthenia gravis, which is a fluctuating muscle weakness autoimmune disease.  I want to evaluate further with: -Blood work today -CT chest to look for thymoma  I will get a copy of your MRI brain to make sure this is normal.  I will be in touch when I have your results.  For your myasthenia, I am starting 2 medications today: -Prednisone 10 mg every day (I would take this in the morning as it can keep you awake if you try to take at night) -Mestinon 60 mg three times daily as needed. Mestinon can cause GI upset and diarrhea.  We discussed warning signs of myasthenia including trouble swallowing or breathing. I want you to call me with ANY new or worsening symptoms.  I want to see you back in clinic in 1 month.  The physicians and staff at [facility] are committed to providing excellent care. You may receive a survey requesting feedback about your experience at our office. We strive to receive "very good" responses to the survey questions. If you feel that your experience would prevent you from giving the office a "very good " response, please contact our office to try to remedy the situation. We may be reached at [phone]. Thank you for taking the time out of your busy day to complete the survey.  Kai Levins, MD [facility]

## 2022-04-20 ENCOUNTER — Encounter: Payer: Self-pay | Admitting: Neurology

## 2022-04-24 DIAGNOSIS — K219 Gastro-esophageal reflux disease without esophagitis: Secondary | ICD-10-CM | POA: Diagnosis not present

## 2022-04-24 DIAGNOSIS — R7301 Impaired fasting glucose: Secondary | ICD-10-CM | POA: Diagnosis not present

## 2022-04-24 DIAGNOSIS — I2699 Other pulmonary embolism without acute cor pulmonale: Secondary | ICD-10-CM | POA: Diagnosis not present

## 2022-04-24 DIAGNOSIS — E7849 Other hyperlipidemia: Secondary | ICD-10-CM | POA: Diagnosis not present

## 2022-04-26 ENCOUNTER — Ambulatory Visit
Admission: RE | Admit: 2022-04-26 | Discharge: 2022-04-26 | Disposition: A | Discharge status: Home / Self Care | Payer: Medicare HMO | Source: Ambulatory Visit | Attending: Neurology | Admitting: Neurology

## 2022-04-26 ENCOUNTER — Encounter: Payer: Self-pay | Admitting: Neurology

## 2022-04-26 DIAGNOSIS — R911 Solitary pulmonary nodule: Secondary | ICD-10-CM | POA: Diagnosis not present

## 2022-04-26 DIAGNOSIS — I700 Atherosclerosis of aorta: Secondary | ICD-10-CM | POA: Diagnosis not present

## 2022-04-26 DIAGNOSIS — G7000 Myasthenia gravis without (acute) exacerbation: Secondary | ICD-10-CM

## 2022-04-26 DIAGNOSIS — R0600 Dyspnea, unspecified: Secondary | ICD-10-CM

## 2022-04-26 DIAGNOSIS — M797 Fibromyalgia: Secondary | ICD-10-CM

## 2022-04-26 DIAGNOSIS — H532 Diplopia: Secondary | ICD-10-CM

## 2022-04-26 DIAGNOSIS — H02402 Unspecified ptosis of left eyelid: Secondary | ICD-10-CM

## 2022-04-27 DIAGNOSIS — I700 Atherosclerosis of aorta: Secondary | ICD-10-CM | POA: Diagnosis not present

## 2022-04-27 DIAGNOSIS — E041 Nontoxic single thyroid nodule: Secondary | ICD-10-CM | POA: Diagnosis not present

## 2022-04-27 DIAGNOSIS — R7301 Impaired fasting glucose: Secondary | ICD-10-CM | POA: Diagnosis not present

## 2022-04-27 DIAGNOSIS — F1190 Opioid use, unspecified, uncomplicated: Secondary | ICD-10-CM | POA: Diagnosis not present

## 2022-04-27 DIAGNOSIS — K2100 Gastro-esophageal reflux disease with esophagitis, without bleeding: Secondary | ICD-10-CM | POA: Diagnosis not present

## 2022-04-27 DIAGNOSIS — I10 Essential (primary) hypertension: Secondary | ICD-10-CM | POA: Diagnosis not present

## 2022-04-27 DIAGNOSIS — M797 Fibromyalgia: Secondary | ICD-10-CM | POA: Diagnosis not present

## 2022-04-27 DIAGNOSIS — J45909 Unspecified asthma, uncomplicated: Secondary | ICD-10-CM | POA: Diagnosis not present

## 2022-04-27 DIAGNOSIS — E7849 Other hyperlipidemia: Secondary | ICD-10-CM | POA: Diagnosis not present

## 2022-05-01 ENCOUNTER — Other Ambulatory Visit: Payer: Self-pay

## 2022-05-01 DIAGNOSIS — E041 Nontoxic single thyroid nodule: Secondary | ICD-10-CM

## 2022-05-04 ENCOUNTER — Ambulatory Visit: Payer: Medicare HMO | Admitting: Neurology
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
## 2022-05-10 ENCOUNTER — Ambulatory Visit
Admission: RE | Admit: 2022-05-10 | Discharge: 2022-05-10 | Disposition: A | Discharge status: Home / Self Care | Payer: Medicare HMO | Source: Ambulatory Visit | Attending: Neurology | Admitting: Neurology

## 2022-05-10 DIAGNOSIS — E041 Nontoxic single thyroid nodule: Secondary | ICD-10-CM

## 2022-05-10 DIAGNOSIS — E042 Nontoxic multinodular goiter: Secondary | ICD-10-CM | POA: Diagnosis not present

## 2022-05-17 ENCOUNTER — Encounter: Payer: Self-pay | Admitting: Neurology

## 2022-05-17 ENCOUNTER — Ambulatory Visit (INDEPENDENT_AMBULATORY_CARE_PROVIDER_SITE_OTHER): Payer: Medicare HMO | Admitting: Neurology

## 2022-05-17 VITALS — BP 125/64 | HR 54 | Ht 64.0 in | Wt 192.0 lb

## 2022-05-17 DIAGNOSIS — H532 Diplopia: Secondary | ICD-10-CM

## 2022-05-17 DIAGNOSIS — E041 Nontoxic single thyroid nodule: Secondary | ICD-10-CM | POA: Diagnosis not present

## 2022-05-17 DIAGNOSIS — H02402 Unspecified ptosis of left eyelid: Secondary | ICD-10-CM | POA: Diagnosis not present

## 2022-05-17 DIAGNOSIS — G7000 Myasthenia gravis without (acute) exacerbation: Secondary | ICD-10-CM

## 2022-05-17 LAB — ACETYLCHOLINE RECEPTOR, MODULATING: Acetylchol Modul Ab: 94 % Inhibition — ABNORMAL HIGH

## 2022-05-17 LAB — ACETYLCHOLINE RECEPTOR, BLOCKING: ACHR Blocking Abs: 57 % Inhibition — ABNORMAL HIGH (ref <15)

## 2022-05-17 MED ORDER — PREDNISONE 2.5 MG PO TABS: 7.5000 mg | ORAL_TABLET | Freq: Every day | ORAL | 3 refills | Status: DC

## 2022-05-17 NOTE — Patient Instructions (Signed)
Plan: -Reduced prednisone to 7.5 mg daily (I sent a new prescription of 2.5 mg tablets, so you will take 3 all at once every day) -Mestinon 60 mg Three times daily as needed -Vit D supplementation 1000 IU daily -Will follow up on thyroid US and let you know the results when I have them. -Discussed warning signs including shortness of breath or difficulty swallowing. Contact me with any concerning symptoms.  Return to clinic in 3 months  The physicians and staff at [facility] are committed to providing excellent care. You may receive a survey requesting feedback about your experience at our office. We strive to receive "very good" responses to the survey questions. If you feel that your experience would prevent you from giving the office a "very good " response, please contact our office to try to remedy the situation. We may be reached at [phone]. Thank you for taking the time out of your busy day to complete the survey.  Kai Levins, MD [facility]

## 2022-05-23 NOTE — Progress Notes (Signed)
PA request was submitted through [facility] for procedure : Urethral Bulking and Botox CPT code: U6375588 L8606 and [id number] J0585 PA is needed for this procedure. PA request was: PENDING  Transaction ID: [id number] Ref#: [id number]

## 2022-05-24 ENCOUNTER — Telehealth: Payer: Self-pay | Admitting: Neurology

## 2022-05-24 NOTE — Telephone Encounter (Signed)
Called patient to discuss results of the thyroid ultrasound. The findings appear similar to prior and are related to known thyroid nodules, not thymoma.  Patient will discuss results with her PCP in case further work up is needed.  All questions were answered.  Kai Levins, MD [facility]

## 2022-05-26 ENCOUNTER — Telehealth: Payer: Self-pay

## 2022-05-26 NOTE — Telephone Encounter (Signed)
-----   Message from Shellia Carwin, MD sent at 05/17/2022 12:40 PM EST ----- Regarding: Thyroid ultrasound D'Arcy Abraha,  This is the lady we discussed that had the thyroid ultrasound last week that does not have a report. Can you call and ask about this?  Thank you,  Kai Levins, MD

## 2022-05-26 NOTE — Telephone Encounter (Signed)
Called and fax to [employer].

## 2022-06-06 DIAGNOSIS — D720 Genetic anomalies of leukocytes: Secondary | ICD-10-CM | POA: Diagnosis not present

## 2022-06-06 DIAGNOSIS — Z86711 Personal history of pulmonary embolism: Secondary | ICD-10-CM | POA: Diagnosis not present

## 2022-06-06 DIAGNOSIS — M47812 Spondylosis without myelopathy or radiculopathy, cervical region: Secondary | ICD-10-CM | POA: Diagnosis not present

## 2022-06-06 DIAGNOSIS — M797 Fibromyalgia: Secondary | ICD-10-CM | POA: Diagnosis not present

## 2022-06-06 DIAGNOSIS — M5136 Other intervertebral disc degeneration, lumbar region: Secondary | ICD-10-CM | POA: Diagnosis not present

## 2022-06-06 DIAGNOSIS — Z86718 Personal history of other venous thrombosis and embolism: Secondary | ICD-10-CM | POA: Diagnosis not present

## 2022-06-06 DIAGNOSIS — G894 Chronic pain syndrome: Secondary | ICD-10-CM | POA: Diagnosis not present

## 2022-06-06 DIAGNOSIS — Z9049 Acquired absence of other specified parts of digestive tract: Secondary | ICD-10-CM | POA: Diagnosis not present

## 2022-06-06 DIAGNOSIS — I10 Essential (primary) hypertension: Secondary | ICD-10-CM | POA: Diagnosis not present

## 2022-06-13 ENCOUNTER — Telehealth: Payer: Self-pay

## 2022-06-13 DIAGNOSIS — J310 Chronic rhinitis: Secondary | ICD-10-CM | POA: Diagnosis not present

## 2022-06-13 DIAGNOSIS — N3281 Overactive bladder: Secondary | ICD-10-CM

## 2022-06-13 DIAGNOSIS — J320 Chronic maxillary sinusitis: Secondary | ICD-10-CM | POA: Diagnosis not present

## 2022-06-13 DIAGNOSIS — J322 Chronic ethmoidal sinusitis: Secondary | ICD-10-CM | POA: Diagnosis not present

## 2022-06-13 MED ORDER — DIAZEPAM 5 MG PO TABS: ORAL_TABLET | ORAL | 0 refills | Status: DC

## 2022-06-13 MED ORDER — SULFAMETHOXAZOLE-TRIMETHOPRIM 800-160 MG PO TABS: 1.0000 | ORAL_TABLET | Freq: Two times a day (BID) | ORAL | 0 refills | Status: DC

## 2022-06-13 NOTE — Telephone Encounter (Signed)
Rx sent for bactrim and valium for day of procedure.   Jaquita Folds, MD

## 2022-06-15 ENCOUNTER — Encounter: Payer: Self-pay | Admitting: Obstetrics and Gynecology

## 2022-06-15 ENCOUNTER — Ambulatory Visit (INDEPENDENT_AMBULATORY_CARE_PROVIDER_SITE_OTHER): Payer: Medicare HMO | Admitting: Obstetrics and Gynecology

## 2022-06-15 VITALS — BP 132/81 | HR 62

## 2022-06-15 DIAGNOSIS — R350 Frequency of micturition: Secondary | ICD-10-CM

## 2022-06-15 DIAGNOSIS — N393 Stress incontinence (female) (male): Secondary | ICD-10-CM

## 2022-06-15 DIAGNOSIS — N3281 Overactive bladder: Secondary | ICD-10-CM | POA: Diagnosis not present

## 2022-06-15 LAB — POCT URINALYSIS DIPSTICK
Bilirubin, UA: NEGATIVE
Blood, UA: NEGATIVE
Glucose, UA: NEGATIVE
Ketones, UA: NEGATIVE
Leukocytes, UA: NEGATIVE
Nitrite, UA: NEGATIVE
Protein, UA: NEGATIVE
Spec Grav, UA: 1.025 (ref 1.010–1.025)
Urobilinogen, UA: 0.2 E.U./dL
pH, UA: 7.5 (ref 5.0–8.0)

## 2022-06-15 MED ORDER — LIDOCAINE HCL URETHRAL/MUCOSAL 2 % EX GEL
1.0000 | Freq: Once | CUTANEOUS | Status: AC
  Administered 2022-06-15: 1 via TOPICAL

## 2022-06-15 MED ORDER — VIBEGRON 75 MG PO TABS: 75.0000 mg | ORAL_TABLET | Freq: Every day | ORAL | 11 refills | Status: DC

## 2022-06-15 MED ORDER — LIDOCAINE-EPINEPHRINE 1 %-1:100000 IJ SOLN
8.0000 mL | Freq: Once | INTRAMUSCULAR | Status: AC
  Administered 2022-06-15: 8 mL
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
## 2022-06-15 NOTE — Patient Instructions (Signed)
Taking Care of Yourself after Urodynamics, Cystoscopy, Bulkamid Injection, or Botox Injection   Drink plenty of water for a day or two following your procedure. Try to have about 8 ounces (one cup) at a time, and do this 6 times or more per day unless you have fluid restrictitons AVOID irritative beverages such as coffee, tea, soda, alcoholic or citrus drinks for a day or two, as this may cause burning with urination.  For the first 1-2 days after the procedure, your urine may be pink or red in color. You may have some blood in your urine as a normal side effect of the procedure. Large amounts of bleeding or difficulty urinating are NOT normal. Call the nurse line if this happens or go to the nearest Emergency Room if the bleeding is heavy or you cannot urinate at all and it is after hours. If you had a Bulkamid injection in the urethra and need to be catheterized, ask for a pediatric catheter to be used (size 10 or 12-French) so the material is not pushed out of place.   You may experience some discomfort or a burning sensation with urination after having this procedure. You can use over the counter Azo or pyridium to help with burning and follow the instructions on the packaging. If it does not improve within 1-2 days, or other symptoms appear (fever, chills, or difficulty urinating) call the office to speak to a nurse.  You may return to normal daily activities such as work, school, driving, exercising and housework on the day of the procedure. If your doctor gave you a prescription, take it as ordered.     

## 2022-06-15 NOTE — Addendum Note (Signed)
Addended by: Jaquita Folds on: 06/15/2022 09:11 AM   Modules accepted: Orders

## 2022-06-19 DIAGNOSIS — Z7901 Long term (current) use of anticoagulants: Secondary | ICD-10-CM | POA: Diagnosis not present

## 2022-06-19 DIAGNOSIS — I871 Compression of vein: Secondary | ICD-10-CM | POA: Diagnosis not present

## 2022-06-19 DIAGNOSIS — K219 Gastro-esophageal reflux disease without esophagitis: Secondary | ICD-10-CM | POA: Diagnosis not present

## 2022-06-19 DIAGNOSIS — J45909 Unspecified asthma, uncomplicated: Secondary | ICD-10-CM | POA: Diagnosis not present

## 2022-06-19 DIAGNOSIS — G894 Chronic pain syndrome: Secondary | ICD-10-CM | POA: Diagnosis not present

## 2022-06-19 DIAGNOSIS — Z79899 Other long term (current) drug therapy: Secondary | ICD-10-CM | POA: Diagnosis not present

## 2022-06-19 DIAGNOSIS — Z888 Allergy status to other drugs, medicaments and biological substances status: Secondary | ICD-10-CM | POA: Diagnosis not present

## 2022-06-19 DIAGNOSIS — M47812 Spondylosis without myelopathy or radiculopathy, cervical region: Secondary | ICD-10-CM | POA: Diagnosis not present

## 2022-06-19 DIAGNOSIS — I10 Essential (primary) hypertension: Secondary | ICD-10-CM | POA: Diagnosis not present

## 2022-07-04 NOTE — Progress Notes (Signed)
APPROVED Ref #:[id number]

## 2022-07-07 DIAGNOSIS — J4540 Moderate persistent asthma, uncomplicated: Secondary | ICD-10-CM | POA: Diagnosis not present

## 2022-07-07 DIAGNOSIS — K219 Gastro-esophageal reflux disease without esophagitis: Secondary | ICD-10-CM | POA: Diagnosis not present

## 2022-07-07 DIAGNOSIS — J300 Vasomotor rhinitis: Secondary | ICD-10-CM | POA: Diagnosis not present

## 2022-07-07 DIAGNOSIS — H1045 Other chronic allergic conjunctivitis: Secondary | ICD-10-CM | POA: Diagnosis not present
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
## 2022-07-13 ENCOUNTER — Ambulatory Visit (INDEPENDENT_AMBULATORY_CARE_PROVIDER_SITE_OTHER): Payer: Medicare HMO | Admitting: Obstetrics and Gynecology

## 2022-07-13 ENCOUNTER — Encounter: Payer: Self-pay | Admitting: Obstetrics and Gynecology

## 2022-07-13 ENCOUNTER — Other Ambulatory Visit (HOSPITAL_COMMUNITY)
Admission: RE | Admit: 2022-07-13 | Discharge: 2022-07-13 | Disposition: A | Discharge status: Home / Self Care | Payer: Medicare HMO | Source: Other Acute Inpatient Hospital | Attending: Obstetrics and Gynecology | Admitting: Obstetrics and Gynecology

## 2022-07-13 VITALS — BP 115/62 | HR 58

## 2022-07-13 DIAGNOSIS — R350 Frequency of micturition: Secondary | ICD-10-CM | POA: Diagnosis not present

## 2022-07-13 DIAGNOSIS — R82998 Other abnormal findings in urine: Secondary | ICD-10-CM | POA: Diagnosis not present

## 2022-07-13 LAB — POCT URINALYSIS DIPSTICK
Bilirubin, UA: NEGATIVE
Glucose, UA: NEGATIVE
Ketones, UA: NEGATIVE
Nitrite, UA: NEGATIVE
Protein, UA: NEGATIVE
Spec Grav, UA: 1.025 (ref 1.010–1.025)
Urobilinogen, UA: 0.2 E.U./dL
pH, UA: 6.5 (ref 5.0–8.0)

## 2022-07-13 NOTE — Patient Instructions (Addendum)
Please let us know what your neurologist says in June and have him send [place] a fax or letter.    Continue on Gemtesa 75mg  daily for now.   Pumpkin seed extract can also be beneficial to assist in overactive bladder. Up to 5gm a day is the suggested amount.

## 2022-07-14 LAB — CULTURE, OB URINE

## 2022-07-15 LAB — CULTURE, OB URINE: Culture: 80000 — AB

## 2022-07-17 ENCOUNTER — Other Ambulatory Visit: Payer: Self-pay | Admitting: Obstetrics and Gynecology

## 2022-07-17 MED ORDER — FLUCONAZOLE 150 MG PO TABS: 150.0000 mg | ORAL_TABLET | Freq: Once | ORAL | 0 refills | Status: AC

## 2022-07-17 MED ORDER — AMOXICILLIN-POT CLAVULANATE 875-125 MG PO TABS: 1.0000 | ORAL_TABLET | Freq: Two times a day (BID) | ORAL | 0 refills | Status: DC

## 2022-07-18 DIAGNOSIS — M47812 Spondylosis without myelopathy or radiculopathy, cervical region: Secondary | ICD-10-CM | POA: Diagnosis not present

## 2022-07-18 DIAGNOSIS — Z79891 Long term (current) use of opiate analgesic: Secondary | ICD-10-CM | POA: Diagnosis not present

## 2022-07-18 DIAGNOSIS — M47816 Spondylosis without myelopathy or radiculopathy, lumbar region: Secondary | ICD-10-CM | POA: Diagnosis not present

## 2022-07-18 DIAGNOSIS — Z1330 Encounter for screening examination for mental health and behavioral disorders, unspecified: Secondary | ICD-10-CM | POA: Diagnosis not present

## 2022-07-18 DIAGNOSIS — G894 Chronic pain syndrome: Secondary | ICD-10-CM | POA: Diagnosis not present

## 2022-08-02 ENCOUNTER — Other Ambulatory Visit: Payer: Self-pay

## 2022-08-02 MED ORDER — FLUCONAZOLE 150 MG PO TABS: 150.0000 mg | ORAL_TABLET | Freq: Once | ORAL | 0 refills | Status: AC

## 2022-08-02 NOTE — Progress Notes (Signed)
Pt recently has a UTI and the antibiotics gave her a yeast infection. Pt was given diflucan by Waldo Laine, NP. Pt is asking for another dose of Diflucan.  Medication has been sent to the pharmacy

## 2022-08-23 DIAGNOSIS — E041 Nontoxic single thyroid nodule: Secondary | ICD-10-CM | POA: Diagnosis not present

## 2022-08-23 DIAGNOSIS — Z86711 Personal history of pulmonary embolism: Secondary | ICD-10-CM | POA: Diagnosis not present

## 2022-08-23 DIAGNOSIS — I10 Essential (primary) hypertension: Secondary | ICD-10-CM | POA: Diagnosis not present

## 2022-08-23 DIAGNOSIS — R7301 Impaired fasting glucose: Secondary | ICD-10-CM | POA: Diagnosis not present

## 2022-08-23 DIAGNOSIS — E7849 Other hyperlipidemia: Secondary | ICD-10-CM | POA: Diagnosis not present
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
## 2022-08-29 DIAGNOSIS — H524 Presbyopia: Secondary | ICD-10-CM | POA: Diagnosis not present

## 2022-08-29 DIAGNOSIS — H52223 Regular astigmatism, bilateral: Secondary | ICD-10-CM | POA: Diagnosis not present

## 2022-08-30 ENCOUNTER — Encounter: Payer: Self-pay | Admitting: Neurology

## 2022-08-30 ENCOUNTER — Ambulatory Visit (INDEPENDENT_AMBULATORY_CARE_PROVIDER_SITE_OTHER): Payer: Medicare HMO | Admitting: Neurology

## 2022-08-30 VITALS — BP 110/62 | HR 67 | Ht 64.0 in | Wt 201.6 lb

## 2022-08-30 DIAGNOSIS — M797 Fibromyalgia: Secondary | ICD-10-CM | POA: Diagnosis not present

## 2022-08-30 DIAGNOSIS — G7000 Myasthenia gravis without (acute) exacerbation: Secondary | ICD-10-CM | POA: Diagnosis not present

## 2022-08-30 DIAGNOSIS — R0600 Dyspnea, unspecified: Secondary | ICD-10-CM | POA: Diagnosis not present

## 2022-08-30 DIAGNOSIS — H02402 Unspecified ptosis of left eyelid: Secondary | ICD-10-CM

## 2022-08-30 DIAGNOSIS — H532 Diplopia: Secondary | ICD-10-CM

## 2022-08-30 MED ORDER — PREDNISONE 2.5 MG PO TABS
5.0000 mg | ORAL_TABLET | Freq: Every day | ORAL | 3 refills | Status: DC
Start: 2022-08-30 — End: 2023-08-08

## 2022-08-30 NOTE — Patient Instructions (Signed)
-  Reduce Prednisone to 5 mg daily. If symptoms worsen, will increase back to 7.5 mg and reach out to me. -Mestinon 60 mg three times daily. Can take as needed if you would like. -Vit D supplementation 1000 IU daily   Return to clinic in 3 months or sooner if needed  The physicians and staff at [facility] are committed to providing excellent care. You may receive a survey requesting feedback about your experience at our office. We strive to receive "very good" responses to the survey questions. If you feel that your experience would prevent you from giving the office a "very good " response, please contact our office to try to remedy the situation. We may be reached at [phone]. Thank you for taking the time out of your busy day to complete the survey.  Jacquelyne Balint, MD [facility]

## 2022-08-31 DIAGNOSIS — I700 Atherosclerosis of aorta: Secondary | ICD-10-CM | POA: Diagnosis not present

## 2022-08-31 DIAGNOSIS — I10 Essential (primary) hypertension: Secondary | ICD-10-CM | POA: Diagnosis not present

## 2022-08-31 DIAGNOSIS — J45909 Unspecified asthma, uncomplicated: Secondary | ICD-10-CM | POA: Diagnosis not present

## 2022-08-31 DIAGNOSIS — K2100 Gastro-esophageal reflux disease with esophagitis, without bleeding: Secondary | ICD-10-CM | POA: Diagnosis not present

## 2022-08-31 DIAGNOSIS — F331 Major depressive disorder, recurrent, moderate: Secondary | ICD-10-CM | POA: Diagnosis not present

## 2022-08-31 DIAGNOSIS — M797 Fibromyalgia: Secondary | ICD-10-CM | POA: Diagnosis not present

## 2022-08-31 DIAGNOSIS — E7849 Other hyperlipidemia: Secondary | ICD-10-CM | POA: Diagnosis not present

## 2022-08-31 DIAGNOSIS — F1190 Opioid use, unspecified, uncomplicated: Secondary | ICD-10-CM | POA: Diagnosis not present

## 2022-08-31 DIAGNOSIS — R7301 Impaired fasting glucose: Secondary | ICD-10-CM | POA: Diagnosis not present

## 2022-09-04 DIAGNOSIS — Z86718 Personal history of other venous thrombosis and embolism: Secondary | ICD-10-CM | POA: Diagnosis not present

## 2022-09-04 DIAGNOSIS — G894 Chronic pain syndrome: Secondary | ICD-10-CM | POA: Diagnosis not present

## 2022-09-04 DIAGNOSIS — F32A Depression, unspecified: Secondary | ICD-10-CM | POA: Diagnosis not present

## 2022-09-04 DIAGNOSIS — M47816 Spondylosis without myelopathy or radiculopathy, lumbar region: Secondary | ICD-10-CM | POA: Diagnosis not present

## 2022-09-04 DIAGNOSIS — G7000 Myasthenia gravis without (acute) exacerbation: Secondary | ICD-10-CM | POA: Diagnosis not present

## 2022-09-04 DIAGNOSIS — J45909 Unspecified asthma, uncomplicated: Secondary | ICD-10-CM | POA: Diagnosis not present

## 2022-09-04 DIAGNOSIS — M797 Fibromyalgia: Secondary | ICD-10-CM | POA: Diagnosis not present

## 2022-09-04 DIAGNOSIS — I10 Essential (primary) hypertension: Secondary | ICD-10-CM | POA: Diagnosis not present

## 2022-09-04 DIAGNOSIS — K219 Gastro-esophageal reflux disease without esophagitis: Secondary | ICD-10-CM | POA: Diagnosis not present

## 2022-09-05 ENCOUNTER — Encounter (INDEPENDENT_AMBULATORY_CARE_PROVIDER_SITE_OTHER): Payer: Self-pay | Admitting: *Deleted

## 2022-09-05 DIAGNOSIS — Z6834 Body mass index (BMI) 34.0-34.9, adult: Secondary | ICD-10-CM | POA: Diagnosis not present

## 2022-09-05 DIAGNOSIS — R030 Elevated blood-pressure reading, without diagnosis of hypertension: Secondary | ICD-10-CM | POA: Diagnosis not present

## 2022-09-05 DIAGNOSIS — R300 Dysuria: Secondary | ICD-10-CM | POA: Diagnosis not present

## 2022-09-19 ENCOUNTER — Encounter (INDEPENDENT_AMBULATORY_CARE_PROVIDER_SITE_OTHER): Payer: Self-pay

## 2022-09-19 ENCOUNTER — Encounter (INDEPENDENT_AMBULATORY_CARE_PROVIDER_SITE_OTHER): Payer: Self-pay | Admitting: Gastroenterology

## 2022-09-19 ENCOUNTER — Ambulatory Visit (INDEPENDENT_AMBULATORY_CARE_PROVIDER_SITE_OTHER): Payer: Medicare HMO | Admitting: Gastroenterology

## 2022-09-19 ENCOUNTER — Telehealth (INDEPENDENT_AMBULATORY_CARE_PROVIDER_SITE_OTHER): Payer: Self-pay | Admitting: Gastroenterology

## 2022-09-19 VITALS — BP 124/78 | HR 67 | Temp 97.5°F | Ht 64.0 in | Wt 200.5 lb

## 2022-09-19 DIAGNOSIS — R1319 Other dysphagia: Secondary | ICD-10-CM | POA: Diagnosis not present

## 2022-09-19 DIAGNOSIS — K219 Gastro-esophageal reflux disease without esophagitis: Secondary | ICD-10-CM | POA: Insufficient documentation

## 2022-09-19 DIAGNOSIS — R1310 Dysphagia, unspecified: Secondary | ICD-10-CM | POA: Diagnosis not present

## 2022-09-19 DIAGNOSIS — R151 Fecal smearing: Secondary | ICD-10-CM | POA: Diagnosis not present
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
## 2022-09-19 NOTE — Telephone Encounter (Signed)
    09/19/22  Justice Rocher October 11, 1960  What type of surgery is being performed? EGD +/- DILATION  When is surgery scheduled? 10/05/22  CLEARANCE TO HOLD XARELTO 2 DAYS PRIOR   Name of physician performing surgery?  Dr. Katrinka Blazing [facility] Gastroenterology at [facility] Phone: [phone] Fax: [phone]  Anethesia type (none, local, MAC, general)? MAC

## 2022-09-19 NOTE — Patient Instructions (Signed)
It was very nice to meet you today, as dicussed with will plan for the following :  1) Esophagram 2) Upper endoscopy with biopsies and possible dilation
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
## 2022-09-20 NOTE — Telephone Encounter (Signed)
Pt is not followed by [facility], has never had an appt with [place]. Clearance should come from managing provider.

## 2022-09-20 NOTE — Telephone Encounter (Signed)
Faxed to PCP

## 2022-09-21 ENCOUNTER — Telehealth (INDEPENDENT_AMBULATORY_CARE_PROVIDER_SITE_OTHER): Payer: Self-pay | Admitting: Gastroenterology

## 2022-09-21 ENCOUNTER — Encounter (INDEPENDENT_AMBULATORY_CARE_PROVIDER_SITE_OTHER): Payer: Self-pay

## 2022-09-21 NOTE — Telephone Encounter (Signed)
Clearance received from Dr.Daniel to hold Xarelto times 2 days. Instructions sent to pt my chart. Pre op date included in instructions (10/02/22 @ 9 am).

## 2022-09-21 NOTE — Telephone Encounter (Signed)
Error

## 2022-09-26 ENCOUNTER — Ambulatory Visit (HOSPITAL_COMMUNITY)
Admission: RE | Admit: 2022-09-26 | Discharge: 2022-09-26 | Disposition: A | Discharge status: Home / Self Care | Payer: Medicare HMO | Source: Ambulatory Visit | Attending: Gastroenterology | Admitting: Gastroenterology

## 2022-09-26 DIAGNOSIS — K449 Diaphragmatic hernia without obstruction or gangrene: Secondary | ICD-10-CM | POA: Diagnosis not present

## 2022-09-26 DIAGNOSIS — R1319 Other dysphagia: Secondary | ICD-10-CM | POA: Insufficient documentation

## 2022-09-26 DIAGNOSIS — R1310 Dysphagia, unspecified: Secondary | ICD-10-CM | POA: Diagnosis not present

## 2022-09-28 NOTE — Patient Instructions (Signed)
Theresa Lucas  09/28/2022     @PREFPERIOPPHARMACY @   Your procedure is scheduled on  10/05/2022.   Report to [facility] at  [phone]  A.M.   Call this number if you have problems the morning of surgery:  [phone]  If you experience any cold or flu symptoms such as cough, fever, chills, shortness of breath, etc. between now and your scheduled surgery, please notify us at the above number.   Remember:  Follow the diet instructions given to you by the office.       Your last dose of xarelto should be on 10/02/2022.     Take these medicines the morning of surgery with A SIP OF WATER          buspar, pepcid, prozac, savella, oxycodone(if needed), protonix, prednisone, gentesa.     Do not wear jewelry, make-up or nail polish, including gel polish,  artificial nails, or any other type of covering on natural nails (fingers and  toes).  Do not wear lotions, powders, or perfumes, or deodorant.  Do not shave 48 hours prior to surgery.  Men may shave face and neck.  Do not bring valuables to the hospital.  [facility] is not responsible for any belongings or valuables.  Contacts, dentures or bridgework may not be worn into surgery.  Leave your suitcase in the car.  After surgery it may be brought to your room.  For patients admitted to the hospital, discharge time will be determined by your treatment team.  Patients discharged the day of surgery will not be allowed to drive home and must have someone with them for 24 hours.    Special instructions:   DO NOT smoke tobacco or vape for 24 hours before your procedure.  Please read over the following fact sheets that you were given. Anesthesia Post-op Instructions and Care and Recovery After Surgery      Upper Endoscopy, Adult, Care After After the procedure, it is common to have a sore throat. It is also common to have: Mild stomach pain or discomfort. Bloating. Nausea. Follow these instructions at  home: The instructions below may help you care for yourself at home. Your health care provider may give you more instructions. If you have questions, ask your health care provider. If you were given a sedative during the procedure, it can affect you for several hours. Do not drive or operate machinery until your health care provider says that it is safe. If you will be going home right after the procedure, plan to have a responsible adult: Take you home from the hospital or clinic. You will not be allowed to drive. Care for you for the time you are told. Follow instructions from your health care provider about what you may eat and drink. Return to your normal activities as told by your health care provider. Ask your health care provider what activities are safe for you. Take over-the-counter and prescription medicines only as told by your health care provider. Contact a health care provider if you: Have a sore throat that lasts longer than one day. Have trouble swallowing. Have a fever. Get help right away if you: Vomit blood or your vomit looks like coffee grounds. Have bloody, black, or tarry stools. Have a very bad sore throat or you cannot swallow. Have difficulty breathing or very bad pain in your chest or abdomen. These symptoms may be an emergency. Get help right away. Call  911. Do not wait to see if the symptoms will go away. Do not drive yourself to the hospital. Summary After the procedure, it is common to have a sore throat, mild stomach discomfort, bloating, and nausea. If you were given a sedative during the procedure, it can affect you for several hours. Do not drive until your health care provider says that it is safe. Follow instructions from your health care provider about what you may eat and drink. Return to your normal activities as told by your health care provider. This information is not intended to replace advice given to you by your health care provider. Make sure  you discuss any questions you have with your health care provider. Document Revised: 06/08/2021 Document Reviewed: 06/08/2021 Elsevier Patient Education  2024 [employer] Esophageal Dilatation Esophageal dilatation, also called esophageal dilation, is a procedure to widen or open a blocked or narrowed part of the esophagus. The esophagus is the part of the body that moves food and liquid from the mouth to the stomach. You may need this procedure if: You have a buildup of scar tissue in your esophagus that makes it difficult, painful, or impossible to swallow. This can be caused by gastroesophageal reflux disease (GERD). You have cancer of the esophagus. There is a problem with how food moves through your esophagus. In some cases, you may need this procedure repeated at a later time to dilate the esophagus gradually. Tell a health care provider about: Any allergies you have. All medicines you are taking, including vitamins, herbs, eye drops, creams, and over-the-counter medicines. Any problems you or family members have had with anesthetic medicines. Any blood disorders you have. Any surgeries you have had. Any medical conditions you have. Any antibiotic medicines you are required to take before dental procedures. Whether you are pregnant or may be pregnant. What are the risks? Generally, this is a safe procedure. However, problems may occur, including: Bleeding due to a tear in the lining of the esophagus. A hole, or perforation, in the esophagus. What happens before the procedure? Ask your health care provider about: Changing or stopping your regular medicines. This is especially important if you are taking diabetes medicines or blood thinners. Taking medicines such as aspirin and ibuprofen. These medicines can thin your blood. Do not take these medicines unless your health care provider tells you to take them. Taking over-the-counter medicines, vitamins, herbs, and supplements. Follow  instructions from your health care provider about eating or drinking restrictions. Plan to have a responsible adult take you home from the hospital or clinic. Plan to have a responsible adult care for you for the time you are told after you leave the hospital or clinic. This is important. What happens during the procedure? You may be given a medicine to help you relax (sedative). A numbing medicine may be sprayed into the back of your throat, or you may gargle the medicine. Your health care provider may perform the dilatation using various surgical instruments, such as: Simple dilators. This instrument is carefully placed in the esophagus to stretch it. Guided wire bougies. This involves using an endoscope to insert a wire into the esophagus. A dilator is passed over this wire to enlarge the esophagus. Then the wire is removed. Balloon dilators. An endoscope with a small balloon is inserted into the esophagus. The balloon is inflated to stretch the esophagus and open it up. The procedure may vary among health care providers and hospitals. What can I expect after the procedure? Your  blood pressure, heart rate, breathing rate, and blood oxygen level will be monitored until you leave the hospital or clinic. Your throat may feel slightly sore and numb. This will get better over time. You will not be allowed to eat or drink until your throat is no longer numb. When you are able to drink, urinate, and sit on the edge of the bed without nausea or dizziness, you may be able to return home. Follow these instructions at home: Take over-the-counter and prescription medicines only as told by your health care provider. If you were given a sedative during the procedure, it can affect you for several hours. Do not drive or operate machinery until your health care provider says that it is safe. Plan to have a responsible adult care for you for the time you are told. This is important. Follow instructions from  your health care provider about any eating or drinking restrictions. Do not use any products that contain nicotine or tobacco, such as cigarettes, e-cigarettes, and chewing tobacco. If you need help quitting, ask your health care provider. Keep all follow-up visits. This is important. Contact a health care provider if: You have a fever. You have pain that is not relieved by medicine. Get help right away if: You have chest pain. You have trouble breathing. You have trouble swallowing. You vomit blood. You have black, tarry, or bloody stools. These symptoms may represent a serious problem that is an emergency. Do not wait to see if the symptoms will go away. Get medical help right away. Call your local emergency services (911 in the [place] Do not drive yourself to the hospital. Summary Esophageal dilatation, also called esophageal dilation, is a procedure to widen or open a blocked or narrowed part of the esophagus. Plan to have a responsible adult take you home from the hospital or clinic. For this procedure, a numbing medicine may be sprayed into the back of your throat, or you may gargle the medicine. Do not drive or operate machinery until your health care provider says that it is safe. This information is not intended to replace advice given to you by your health care provider. Make sure you discuss any questions you have with your health care provider. Document Revised: 07/16/2019 Document Reviewed: 07/16/2019 Elsevier Patient Education  2024 Else[employer] Inc. Monitored Anesthesia Care, Care After The following information offers guidance on how to care for yourself after your procedure. Your health care provider may also give you more specific instructions. If you have problems or questions, contact your health care provider. What can I expect after the procedure? After the procedure, it is common to have: Tiredness. Little or no memory about what happened during or after the  procedure. Impaired judgment when it comes to making decisions. Nausea or vomiting. Some trouble with balance. Follow these instructions at home: For the time period you were told by your health care provider:  Rest. Do not participate in activities where you could fall or become injured. Do not drive or use machinery. Do not drink alcohol. Do not take sleeping pills or medicines that cause drowsiness. Do not make important decisions or sign legal documents. Do not take care of children on your own. Medicines Take over-the-counter and prescription medicines only as told by your health care provider. If you were prescribed antibiotics, take them as told by your health care provider. Do not stop using the antibiotic even if you start to feel better. Eating and drinking Follow instructions from your health care  provider about what you may eat and drink. Drink enough fluid to keep your urine pale yellow. If you vomit: Drink clear fluids slowly and in small amounts as you are able. Clear fluids include water, ice chips, low-calorie sports drinks, and fruit juice that has water added to it (diluted fruit juice). Eat light and bland foods in small amounts as you are able. These foods include bananas, applesauce, rice, lean meats, toast, and crackers. General instructions  Have a responsible adult stay with you for the time you are told. It is important to have someone help care for you until you are awake and alert. If you have sleep apnea, surgery and some medicines can increase your risk for breathing problems. Follow instructions from your health care provider about wearing your sleep device: When you are sleeping. This includes during daytime naps. While taking prescription pain medicines, sleeping medicines, or medicines that make you drowsy. Do not use any products that contain nicotine or tobacco. These products include cigarettes, chewing tobacco, and vaping devices, such as  e-cigarettes. If you need help quitting, ask your health care provider. Contact a health care provider if: You feel nauseous or vomit every time you eat or drink. You feel light-headed. You are still sleepy or having trouble with balance after 24 hours. You get a rash. You have a fever. You have redness or swelling around the IV site. Get help right away if: You have trouble breathing. You have new confusion after you get home. These symptoms may be an emergency. Get help right away. Call 911. Do not wait to see if the symptoms will go away. Do not drive yourself to the hospital. This information is not intended to replace advice given to you by your health care provider. Make sure you discuss any questions you have with your health care provider. Document Revised: 07/25/2021 Document Reviewed: 07/25/2021 Elsevier Patient Education  2024 [employer].

## 2022-10-02 ENCOUNTER — Encounter (HOSPITAL_COMMUNITY): Payer: Self-pay

## 2022-10-02 ENCOUNTER — Encounter (HOSPITAL_COMMUNITY)
Admission: RE | Admit: 2022-10-02 | Discharge: 2022-10-02 | Disposition: A | Discharge status: Home / Self Care | Payer: Medicare HMO | Source: Ambulatory Visit | Attending: Gastroenterology | Admitting: Gastroenterology

## 2022-10-02 VITALS — BP 124/78 | HR 67 | Temp 97.5°F | Resp 18 | Ht 64.0 in | Wt 200.5 lb

## 2022-10-02 DIAGNOSIS — R9431 Abnormal electrocardiogram [ECG] [EKG]: Secondary | ICD-10-CM | POA: Insufficient documentation

## 2022-10-02 DIAGNOSIS — Z79899 Other long term (current) drug therapy: Secondary | ICD-10-CM | POA: Diagnosis not present

## 2022-10-02 DIAGNOSIS — Z01818 Encounter for other preprocedural examination: Secondary | ICD-10-CM | POA: Diagnosis not present

## 2022-10-02 DIAGNOSIS — I10 Essential (primary) hypertension: Secondary | ICD-10-CM | POA: Diagnosis not present

## 2022-10-02 HISTORY — DX: Unspecified asthma, uncomplicated: J45.909

## 2022-10-02 HISTORY — DX: Personal history of other diseases of the nervous system and sense organs: Z86.69

## 2022-10-02 LAB — BASIC METABOLIC PANEL
Anion gap: 7 (ref 5–15)
BUN: 24 mg/dL — ABNORMAL HIGH (ref 6–20)
CO2: 25 mmol/L (ref 22–32)
Calcium: 8.7 mg/dL — ABNORMAL LOW (ref 8.9–10.3)
Chloride: 104 mmol/L (ref 98–111)
Creatinine, Ser: 1.07 mg/dL — ABNORMAL HIGH (ref 0.44–1.00)
GFR, Estimated: 59 mL/min — ABNORMAL LOW (ref >60)
Glucose, Bld: 154 mg/dL — ABNORMAL HIGH (ref 70–99)
Potassium: 3.4 mmol/L — ABNORMAL LOW (ref 3.5–5.1)
Sodium: 136 mmol/L (ref 135–145)

## 2022-10-05 ENCOUNTER — Encounter (HOSPITAL_COMMUNITY): Payer: Self-pay

## 2022-10-05 ENCOUNTER — Encounter (HOSPITAL_COMMUNITY)
Admission: RE | Disposition: A | Discharge status: Home / Self Care | Payer: Self-pay | Source: Home / Self Care | Attending: Gastroenterology

## 2022-10-05 ENCOUNTER — Ambulatory Visit (HOSPITAL_COMMUNITY)
Admission: RE | Admit: 2022-10-05 | Discharge: 2022-10-05 | Disposition: A | Discharge status: Home / Self Care | Payer: Medicare HMO | Attending: Gastroenterology | Admitting: Gastroenterology

## 2022-10-05 ENCOUNTER — Ambulatory Visit (HOSPITAL_COMMUNITY): Payer: Medicare HMO | Admitting: Anesthesiology

## 2022-10-05 ENCOUNTER — Ambulatory Visit (HOSPITAL_BASED_OUTPATIENT_CLINIC_OR_DEPARTMENT_OTHER): Payer: Medicare HMO | Admitting: Anesthesiology

## 2022-10-05 DIAGNOSIS — J45909 Unspecified asthma, uncomplicated: Secondary | ICD-10-CM | POA: Insufficient documentation

## 2022-10-05 DIAGNOSIS — E119 Type 2 diabetes mellitus without complications: Secondary | ICD-10-CM | POA: Diagnosis not present

## 2022-10-05 DIAGNOSIS — I10 Essential (primary) hypertension: Secondary | ICD-10-CM | POA: Diagnosis not present

## 2022-10-05 DIAGNOSIS — Z7952 Long term (current) use of systemic steroids: Secondary | ICD-10-CM | POA: Diagnosis not present

## 2022-10-05 DIAGNOSIS — R159 Full incontinence of feces: Secondary | ICD-10-CM | POA: Diagnosis not present

## 2022-10-05 DIAGNOSIS — Z96659 Presence of unspecified artificial knee joint: Secondary | ICD-10-CM | POA: Diagnosis not present

## 2022-10-05 DIAGNOSIS — K317 Polyp of stomach and duodenum: Secondary | ICD-10-CM | POA: Diagnosis not present

## 2022-10-05 DIAGNOSIS — R1310 Dysphagia, unspecified: Secondary | ICD-10-CM | POA: Diagnosis not present

## 2022-10-05 DIAGNOSIS — Z86711 Personal history of pulmonary embolism: Secondary | ICD-10-CM | POA: Diagnosis not present

## 2022-10-05 DIAGNOSIS — Z86718 Personal history of other venous thrombosis and embolism: Secondary | ICD-10-CM | POA: Insufficient documentation

## 2022-10-05 DIAGNOSIS — K319 Disease of stomach and duodenum, unspecified: Secondary | ICD-10-CM | POA: Diagnosis not present

## 2022-10-05 DIAGNOSIS — Z79899 Other long term (current) drug therapy: Secondary | ICD-10-CM | POA: Diagnosis not present

## 2022-10-05 DIAGNOSIS — G7000 Myasthenia gravis without (acute) exacerbation: Secondary | ICD-10-CM | POA: Diagnosis not present

## 2022-10-05 DIAGNOSIS — F419 Anxiety disorder, unspecified: Secondary | ICD-10-CM | POA: Diagnosis not present

## 2022-10-05 DIAGNOSIS — M797 Fibromyalgia: Secondary | ICD-10-CM | POA: Insufficient documentation

## 2022-10-05 DIAGNOSIS — F418 Other specified anxiety disorders: Secondary | ICD-10-CM | POA: Diagnosis not present

## 2022-10-05 DIAGNOSIS — K449 Diaphragmatic hernia without obstruction or gangrene: Secondary | ICD-10-CM | POA: Insufficient documentation

## 2022-10-05 DIAGNOSIS — F32A Depression, unspecified: Secondary | ICD-10-CM | POA: Insufficient documentation

## 2022-10-05 DIAGNOSIS — R1110 Vomiting, unspecified: Secondary | ICD-10-CM | POA: Diagnosis not present

## 2022-10-05 DIAGNOSIS — K3189 Other diseases of stomach and duodenum: Secondary | ICD-10-CM | POA: Insufficient documentation

## 2022-10-05 DIAGNOSIS — K219 Gastro-esophageal reflux disease without esophagitis: Secondary | ICD-10-CM | POA: Insufficient documentation

## 2022-10-05 HISTORY — PX: ESOPHAGOGASTRODUODENOSCOPY (EGD) WITH PROPOFOL: SHX5813

## 2022-10-05 HISTORY — PX: BIOPSY: SHX5522

## 2022-10-05 HISTORY — PX: POLYPECTOMY: SHX5525

## 2022-10-05 SURGERY — ESOPHAGOGASTRODUODENOSCOPY (EGD) WITH PROPOFOL
Anesthesia: General

## 2022-10-05 MED ORDER — LACTATED RINGERS IV SOLN: INTRAVENOUS | Status: DC

## 2022-10-05 MED ORDER — PROPOFOL 10 MG/ML IV BOLUS
INTRAVENOUS | Status: DC | PRN
  Administered 2022-10-05: 25 mg via INTRAVENOUS
  Administered 2022-10-05 (×3): 20 mg via INTRAVENOUS
  Administered 2022-10-05: 75 mg via INTRAVENOUS
  Administered 2022-10-05 (×2): 50 mg via INTRAVENOUS

## 2022-10-05 MED ORDER — LACTATED RINGERS IV SOLN: INTRAVENOUS | Status: DC | PRN

## 2022-10-05 MED ORDER — LIDOCAINE HCL 1 % IJ SOLN
INTRAMUSCULAR | Status: DC | PRN
  Administered 2022-10-05: 50 mg via INTRADERMAL

## 2022-10-05 NOTE — Interval H&P Note (Signed)
History and Physical Interval Note:  10/05/2022 9:05 AM  Theresa Lucas  has presented today for surgery, with the diagnosis of DYSPHAGIA, ODYNOPHAGIA.  The various methods of treatment have been discussed with the patient and family. After consideration of risks, benefits and other options for treatment, the patient has consented to  Procedure(s) with comments: ESOPHAGOGASTRODUODENOSCOPY (EGD) WITH PROPOFOL (N/A) - 8:45AM;ASA 3 BALLOON DILATION (N/A) - 8:45AM;ASA 3 as a surgical intervention.  The patient's history has been reviewed, patient examined, no change in status, stable for surgery.  I have reviewed the patient's chart and labs.  Questions were answered to the patient's satisfaction.     [name]

## 2022-10-05 NOTE — Anesthesia Postprocedure Evaluation (Signed)
Anesthesia Post Note  Patient: Theresa Lucas  Procedure(s) Performed: ESOPHAGOGASTRODUODENOSCOPY (EGD) WITH PROPOFOL POLYPECTOMY BIOPSY  Patient location during evaluation: Short Stay Anesthesia Type: General Level of consciousness: awake and alert Pain management: pain level controlled Vital Signs Assessment: post-procedure vital signs reviewed and stable Respiratory status: spontaneous breathing Cardiovascular status: blood pressure returned to baseline Postop Assessment: no apparent nausea or vomiting Anesthetic complications: no   No notable events documented.   Last Vitals:  Vitals:   10/05/22 0800  BP: 118/64  Pulse: (!) 58  Resp: 16  Temp: 36.7 C  SpO2: 98%    Last Pain:  Vitals:   10/05/22 0933  TempSrc:   PainSc: 0-No pain                 Olivea Sonnen

## 2022-10-05 NOTE — Discharge Instructions (Signed)

## 2022-10-05 NOTE — Op Note (Signed)
[facility] Patient Name: Theresa Lucas Procedure Date: 10/05/2022 9:26 AM MRN: [id number] Date of Birth: 1960/09/26 Attending MD: Sanjuan Dame , MD, [id number] CSN: [id number] Age: 62 Admit Type: Outpatient Procedure:                Upper GI endoscopy Indications:              Dysphagia, Odynophagia Providers:                Sanjuan Dame, MD, Angelica Ran, Elinor Parkinson,                            Dyann Ruddle Referring MD:              Medicines:                Monitored Anesthesia Care Complications:            No immediate complications. Estimated Blood Loss:     Estimated blood loss: none. Procedure:                Pre-Anesthesia Assessment:                           - Prior to the procedure, a History and Physical                            was performed, and patient medications and                            allergies were reviewed. The patient's tolerance of                            previous anesthesia was also reviewed. The risks                            and benefits of the procedure and the sedation                            options and risks were discussed with the patient.                            All questions were answered, and informed consent                            was obtained. Prior Anticoagulants: The patient has                            taken Xarelto (rivaroxaban), last dose was 3 days                            prior to procedure. ASA Grade Assessment: III - A                            patient with severe systemic disease. After  reviewing the risks and benefits, the patient was                            deemed in satisfactory condition to undergo the                            procedure.                           After obtaining informed consent, the endoscope was                            passed under direct vision. Throughout the                            procedure, the patient's blood pressure, pulse, and                             oxygen saturations were monitored continuously. The                            [id number]-H190 ([id number]) scope was introduced through the                            mouth, and advanced to the second part of duodenum.                            The upper GI endoscopy was accomplished without                            difficulty. The patient tolerated the procedure                            well. Scope In: 9:38:44 AM Scope Out: 9:49:06 AM Total Procedure Duration: 0 hours 10 minutes 22 seconds  Findings:      No endoscopic abnormality was evident in the esophagus to explain the       patient's complaint of dysphagia. Biopsies were obtained from the       proximal and distal esophagus with cold forceps for histology of       suspected eosinophilic esophagitis.      A 2 cm hiatal hernia was present.      The Z-line was regular and was found 36 cm from the incisors.      Esophagogastric landmarks were identified: the gastroesophageal junction       was found at 38 cm from the incisors.      Diffuse mildly erythematous mucosa without bleeding was found in the       stomach. Biopsies were taken with a cold forceps for histology.      Multiple 2 to 8 mm sessile polyps with no bleeding and no stigmata of       recent bleeding were found in the stomach. The polyp was removed with a       cold snare. Resection and retrieval were complete.      The duodenal bulb and second portion of the duodenum were normal. Impression:               -  No endoscopic esophageal abnormality to explain                            patient's dysphagia.                           - 2 cm hiatal hernia.                           - Z-line regular, 36 cm from the incisors.                           - Esophagogastric landmarks identified.                           - Erythematous mucosa in the stomach. Biopsied.                           - Multiple gastric polyps.largest polyp Resected                             and retrieved.                           - Normal duodenal bulb and second portion of the                            duodenum.                           - Biopsies were taken with a cold forceps for                            evaluation of eosinophilic esophagitis. Moderate Sedation:      Per Anesthesia Care Recommendation:           - Patient has a contact number available for                            emergencies. The signs and symptoms of potential                            delayed complications were discussed with the                            patient. Return to normal activities tomorrow.                            Written discharge instructions were provided to the                            patient.                           - Resume previous diet.                           -  Continue present medications.                           - Await pathology results.                           - Repeat upper endoscopy for surveillance based on                            pathology results.                           - Return to GI clinic today. Procedure Code(s):        --- Professional ---                           [phone], Esophagogastroduodenoscopy, flexible,                            transoral; with removal of tumor(s), polyp(s), or                            other lesion(s) by snare technique                           43239, 59, Esophagogastroduodenoscopy, flexible,                            transoral; with biopsy, single or multiple Diagnosis Code(s):        --- Professional ---                           R13.10, Dysphagia, unspecified                           K44.9, Diaphragmatic hernia without obstruction or                            gangrene                           K31.89, Other diseases of stomach and duodenum                           K31.7, Polyp of stomach and duodenum CPT copyright 2022 American Medical Association. All rights reserved. The codes documented in  this report are preliminary and upon coder review may  be revised to meet current compliance requirements. Sanjuan Dame, MD Sanjuan Dame, MD 10/05/2022 9:55:27 AM This report has been signed electronically. Number of Addenda: 0

## 2022-10-05 NOTE — Transfer of Care (Signed)
Immediate Anesthesia Transfer of Care Note  Patient: Theresa Lucas  Procedure(s) Performed: ESOPHAGOGASTRODUODENOSCOPY (EGD) WITH PROPOFOL POLYPECTOMY BIOPSY  Patient Location: Short Stay  Anesthesia Type:General  Level of Consciousness: awake  Airway & Oxygen Therapy: Patient Spontanous Breathing  Post-op Assessment: Report given to RN  Post vital signs: Reviewed and stable  Last Vitals:  Vitals Value Taken Time  BP    Temp    Pulse    Resp    SpO2      Last Pain:  Vitals:   10/05/22 0933  TempSrc:   PainSc: 0-No pain      Patients Stated Pain Goal: 7 (10/05/22 0800)  Complications: No notable events documented.

## 2022-10-05 NOTE — Anesthesia Preprocedure Evaluation (Signed)
Anesthesia Evaluation  Patient identified by MRN, date of birth, ID band Patient awake    Reviewed: Allergy & Precautions, H&P , NPO status , Patient's Chart, lab work & pertinent test results, reviewed documented beta blocker date and time   Airway Mallampati: II  TM Distance: >3 FB Neck ROM: full    Dental no notable dental hx.    Pulmonary neg pulmonary ROS, asthma    Pulmonary exam normal breath sounds clear to auscultation       Cardiovascular Exercise Tolerance: Good hypertension, negative cardio ROS  Rhythm:regular Rate:Normal     Neuro/Psych  PSYCHIATRIC DISORDERS Anxiety Depression     Neuromuscular disease negative neurological ROS  negative psych ROS   GI/Hepatic negative GI ROS, Neg liver ROS,GERD  ,,  Endo/Other  negative endocrine ROSdiabetes    Renal/GU negative Renal ROS  negative genitourinary   Musculoskeletal   Abdominal   Peds  Hematology negative hematology ROS (+)   Anesthesia Other Findings   Reproductive/Obstetrics negative OB ROS                             Anesthesia Physical Anesthesia Plan  ASA: 3  Anesthesia Plan: General   Post-op Pain Management:    Induction:   PONV Risk Score and Plan: Propofol infusion  Airway Management Planned:   Additional Equipment:   Intra-op Plan:   Post-operative Plan:   Informed Consent: I have reviewed the patients History and Physical, chart, labs and discussed the procedure including the risks, benefits and alternatives for the proposed anesthesia with the patient or authorized representative who has indicated his/her understanding and acceptance.     Dental Advisory Given  Plan Discussed with: CRNA  Anesthesia Plan Comments:        Anesthesia Quick Evaluation

## 2022-10-09 ENCOUNTER — Encounter (HOSPITAL_COMMUNITY): Payer: Self-pay | Admitting: Gastroenterology

## 2022-10-10 ENCOUNTER — Encounter (INDEPENDENT_AMBULATORY_CARE_PROVIDER_SITE_OTHER): Payer: Self-pay | Admitting: *Deleted

## 2022-10-17 DIAGNOSIS — Z79891 Long term (current) use of opiate analgesic: Secondary | ICD-10-CM | POA: Diagnosis not present

## 2022-10-17 DIAGNOSIS — M5136 Other intervertebral disc degeneration, lumbar region: Secondary | ICD-10-CM | POA: Diagnosis not present

## 2022-10-17 DIAGNOSIS — G894 Chronic pain syndrome: Secondary | ICD-10-CM | POA: Diagnosis not present

## 2022-10-17 DIAGNOSIS — M5416 Radiculopathy, lumbar region: Secondary | ICD-10-CM | POA: Diagnosis not present

## 2022-10-26 ENCOUNTER — Ambulatory Visit (INDEPENDENT_AMBULATORY_CARE_PROVIDER_SITE_OTHER): Payer: Medicare HMO | Admitting: Gastroenterology

## 2022-11-14 DIAGNOSIS — Z6835 Body mass index (BMI) 35.0-35.9, adult: Secondary | ICD-10-CM | POA: Diagnosis not present

## 2022-11-14 DIAGNOSIS — R030 Elevated blood-pressure reading, without diagnosis of hypertension: Secondary | ICD-10-CM | POA: Diagnosis not present

## 2022-11-14 DIAGNOSIS — J4541 Moderate persistent asthma with (acute) exacerbation: Secondary | ICD-10-CM | POA: Diagnosis not present

## 2022-11-14 DIAGNOSIS — J209 Acute bronchitis, unspecified: Secondary | ICD-10-CM | POA: Diagnosis not present

## 2022-11-22 ENCOUNTER — Emergency Department (HOSPITAL_COMMUNITY): Payer: Medicare HMO

## 2022-11-22 ENCOUNTER — Other Ambulatory Visit: Payer: Self-pay

## 2022-11-22 ENCOUNTER — Inpatient Hospital Stay (HOSPITAL_COMMUNITY)
Admission: EM | Admit: 2022-11-22 | Discharge: 2022-11-25 | DRG: 281 | Disposition: A | Discharge status: Home / Self Care | Payer: Medicare HMO | Attending: Internal Medicine | Admitting: Internal Medicine

## 2022-11-22 DIAGNOSIS — Z79899 Other long term (current) drug therapy: Secondary | ICD-10-CM | POA: Diagnosis not present

## 2022-11-22 DIAGNOSIS — I10 Essential (primary) hypertension: Secondary | ICD-10-CM | POA: Insufficient documentation

## 2022-11-22 DIAGNOSIS — Z6836 Body mass index (BMI) 36.0-36.9, adult: Secondary | ICD-10-CM

## 2022-11-22 DIAGNOSIS — Z9049 Acquired absence of other specified parts of digestive tract: Secondary | ICD-10-CM

## 2022-11-22 DIAGNOSIS — I252 Old myocardial infarction: Secondary | ICD-10-CM | POA: Diagnosis not present

## 2022-11-22 DIAGNOSIS — Z7901 Long term (current) use of anticoagulants: Secondary | ICD-10-CM

## 2022-11-22 DIAGNOSIS — I2510 Atherosclerotic heart disease of native coronary artery without angina pectoris: Secondary | ICD-10-CM | POA: Diagnosis present

## 2022-11-22 DIAGNOSIS — Z823 Family history of stroke: Secondary | ICD-10-CM

## 2022-11-22 DIAGNOSIS — I214 Non-ST elevation (NSTEMI) myocardial infarction: Secondary | ICD-10-CM | POA: Insufficient documentation

## 2022-11-22 DIAGNOSIS — F32A Depression, unspecified: Secondary | ICD-10-CM | POA: Diagnosis present

## 2022-11-22 DIAGNOSIS — I2489 Other forms of acute ischemic heart disease: Secondary | ICD-10-CM | POA: Diagnosis present

## 2022-11-22 DIAGNOSIS — Z90710 Acquired absence of both cervix and uterus: Secondary | ICD-10-CM

## 2022-11-22 DIAGNOSIS — Z7951 Long term (current) use of inhaled steroids: Secondary | ICD-10-CM

## 2022-11-22 DIAGNOSIS — K219 Gastro-esophageal reflux disease without esophagitis: Secondary | ICD-10-CM | POA: Diagnosis present

## 2022-11-22 DIAGNOSIS — Z7952 Long term (current) use of systemic steroids: Secondary | ICD-10-CM

## 2022-11-22 DIAGNOSIS — Z8249 Family history of ischemic heart disease and other diseases of the circulatory system: Secondary | ICD-10-CM | POA: Diagnosis not present

## 2022-11-22 DIAGNOSIS — F419 Anxiety disorder, unspecified: Secondary | ICD-10-CM | POA: Insufficient documentation

## 2022-11-22 DIAGNOSIS — R072 Precordial pain: Secondary | ICD-10-CM | POA: Diagnosis not present

## 2022-11-22 DIAGNOSIS — E876 Hypokalemia: Secondary | ICD-10-CM | POA: Diagnosis present

## 2022-11-22 DIAGNOSIS — Z91040 Latex allergy status: Secondary | ICD-10-CM

## 2022-11-22 DIAGNOSIS — R079 Chest pain, unspecified: Secondary | ICD-10-CM | POA: Diagnosis present

## 2022-11-22 DIAGNOSIS — I871 Compression of vein: Secondary | ICD-10-CM | POA: Diagnosis present

## 2022-11-22 DIAGNOSIS — Z8261 Family history of arthritis: Secondary | ICD-10-CM

## 2022-11-22 DIAGNOSIS — G7000 Myasthenia gravis without (acute) exacerbation: Secondary | ICD-10-CM | POA: Diagnosis present

## 2022-11-22 DIAGNOSIS — Z86711 Personal history of pulmonary embolism: Secondary | ICD-10-CM

## 2022-11-22 DIAGNOSIS — I4891 Unspecified atrial fibrillation: Principal | ICD-10-CM | POA: Diagnosis present

## 2022-11-22 DIAGNOSIS — R7989 Other specified abnormal findings of blood chemistry: Secondary | ICD-10-CM | POA: Diagnosis not present

## 2022-11-22 DIAGNOSIS — Z800 Family history of malignant neoplasm of digestive organs: Secondary | ICD-10-CM

## 2022-11-22 DIAGNOSIS — Z888 Allergy status to other drugs, medicaments and biological substances status: Secondary | ICD-10-CM

## 2022-11-22 DIAGNOSIS — E669 Obesity, unspecified: Secondary | ICD-10-CM | POA: Diagnosis present

## 2022-11-22 DIAGNOSIS — M797 Fibromyalgia: Secondary | ICD-10-CM | POA: Diagnosis present

## 2022-11-22 DIAGNOSIS — Z86718 Personal history of other venous thrombosis and embolism: Secondary | ICD-10-CM

## 2022-11-22 DIAGNOSIS — I480 Paroxysmal atrial fibrillation: Principal | ICD-10-CM | POA: Diagnosis present

## 2022-11-22 DIAGNOSIS — J45909 Unspecified asthma, uncomplicated: Secondary | ICD-10-CM | POA: Diagnosis present

## 2022-11-22 DIAGNOSIS — E785 Hyperlipidemia, unspecified: Secondary | ICD-10-CM | POA: Diagnosis present

## 2022-11-22 DIAGNOSIS — R0689 Other abnormalities of breathing: Secondary | ICD-10-CM | POA: Diagnosis not present

## 2022-11-22 DIAGNOSIS — Z833 Family history of diabetes mellitus: Secondary | ICD-10-CM

## 2022-11-22 DIAGNOSIS — R000 Tachycardia, unspecified: Secondary | ICD-10-CM | POA: Diagnosis not present

## 2022-11-22 LAB — BASIC METABOLIC PANEL
Anion gap: 8 (ref 5–15)
BUN: 21 mg/dL — ABNORMAL HIGH (ref 6–20)
CO2: 25 mmol/L (ref 22–32)
Calcium: 8.2 mg/dL — ABNORMAL LOW (ref 8.9–10.3)
Chloride: 102 mmol/L (ref 98–111)
Creatinine, Ser: 1 mg/dL (ref 0.44–1.00)
GFR, Estimated: >60 mL/min (ref >60)
Glucose, Bld: 143 mg/dL — ABNORMAL HIGH (ref 70–99)
Potassium: 3.3 mmol/L — ABNORMAL LOW (ref 3.5–5.1)
Sodium: 135 mmol/L (ref 135–145)

## 2022-11-22 LAB — MAGNESIUM: Magnesium: 2.2 mg/dL (ref 1.7–2.4)

## 2022-11-22 LAB — CBC
HCT: 44.3 % (ref 36.0–46.0)
Hemoglobin: 14.4 g/dL (ref 12.0–15.0)
MCH: 29.3 pg (ref 26.0–34.0)
MCHC: 32.5 g/dL (ref 30.0–36.0)
MCV: 90 fL (ref 80.0–100.0)
Platelets: 357 10*3/uL (ref 150–400)
RBC: 4.92 MIL/uL (ref 3.87–5.11)
RDW: 13 % (ref 11.5–15.5)
WBC: 11.9 10*3/uL — ABNORMAL HIGH (ref 4.0–10.5)
nRBC: 0 % (ref 0.0–0.2)

## 2022-11-22 LAB — TSH: TSH: 0.803 u[IU]/mL (ref 0.350–4.500)

## 2022-11-22 LAB — TROPONIN I (HIGH SENSITIVITY): Troponin I (High Sensitivity): 227 ng/L (ref <18)

## 2022-11-22 MED ORDER — RIVAROXABAN 20 MG PO TABS
20.0000 mg | ORAL_TABLET | Freq: Once | ORAL | Status: AC
  Administered 2022-11-23: 20 mg via ORAL
  Filled 2022-11-22: qty 1

## 2022-11-22 MED ORDER — ETOMIDATE 2 MG/ML IV SOLN
10.0000 mg | Freq: Once | INTRAVENOUS | Status: AC
  Administered 2022-11-23: 10 mg via INTRAVENOUS
  Filled 2022-11-22: qty 10

## 2022-11-22 MED ORDER — SODIUM CHLORIDE 0.9 % IV BOLUS
1000.0000 mL | Freq: Once | INTRAVENOUS | Status: AC
  Administered 2022-11-22: 1000 mL via INTRAVENOUS

## 2022-11-22 MED ORDER — METOPROLOL TARTRATE 5 MG/5ML IV SOLN
5.0000 mg | Freq: Once | INTRAVENOUS | Status: AC
  Administered 2022-11-22: 5 mg via INTRAVENOUS
  Filled 2022-11-22: qty 5

## 2022-11-22 NOTE — ED Notes (Signed)
EDP Wallace Cullens informed pt HR remains 140s-160s. Continues to c/o ongoing SOB.
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
## 2022-11-23 ENCOUNTER — Encounter (HOSPITAL_COMMUNITY): Payer: Self-pay | Admitting: Internal Medicine

## 2022-11-23 ENCOUNTER — Other Ambulatory Visit (HOSPITAL_COMMUNITY): Payer: Medicare HMO

## 2022-11-23 DIAGNOSIS — K219 Gastro-esophageal reflux disease without esophagitis: Secondary | ICD-10-CM | POA: Diagnosis present

## 2022-11-23 DIAGNOSIS — I4891 Unspecified atrial fibrillation: Principal | ICD-10-CM | POA: Diagnosis present

## 2022-11-23 DIAGNOSIS — G7000 Myasthenia gravis without (acute) exacerbation: Secondary | ICD-10-CM

## 2022-11-23 DIAGNOSIS — I252 Old myocardial infarction: Secondary | ICD-10-CM | POA: Diagnosis not present

## 2022-11-23 DIAGNOSIS — F32A Depression, unspecified: Secondary | ICD-10-CM | POA: Diagnosis present

## 2022-11-23 DIAGNOSIS — R079 Chest pain, unspecified: Secondary | ICD-10-CM | POA: Diagnosis present

## 2022-11-23 DIAGNOSIS — Z86711 Personal history of pulmonary embolism: Secondary | ICD-10-CM | POA: Diagnosis not present

## 2022-11-23 DIAGNOSIS — F419 Anxiety disorder, unspecified: Secondary | ICD-10-CM

## 2022-11-23 DIAGNOSIS — J45909 Unspecified asthma, uncomplicated: Secondary | ICD-10-CM | POA: Diagnosis present

## 2022-11-23 DIAGNOSIS — R7989 Other specified abnormal findings of blood chemistry: Secondary | ICD-10-CM | POA: Diagnosis not present

## 2022-11-23 DIAGNOSIS — E669 Obesity, unspecified: Secondary | ICD-10-CM | POA: Diagnosis present

## 2022-11-23 DIAGNOSIS — Z7901 Long term (current) use of anticoagulants: Secondary | ICD-10-CM | POA: Diagnosis not present

## 2022-11-23 DIAGNOSIS — Z86718 Personal history of other venous thrombosis and embolism: Secondary | ICD-10-CM

## 2022-11-23 DIAGNOSIS — E876 Hypokalemia: Secondary | ICD-10-CM | POA: Diagnosis present

## 2022-11-23 DIAGNOSIS — Z9049 Acquired absence of other specified parts of digestive tract: Secondary | ICD-10-CM | POA: Diagnosis not present

## 2022-11-23 DIAGNOSIS — Z79899 Other long term (current) drug therapy: Secondary | ICD-10-CM | POA: Diagnosis not present

## 2022-11-23 DIAGNOSIS — E785 Hyperlipidemia, unspecified: Secondary | ICD-10-CM | POA: Diagnosis present

## 2022-11-23 DIAGNOSIS — I10 Essential (primary) hypertension: Secondary | ICD-10-CM

## 2022-11-23 DIAGNOSIS — I214 Non-ST elevation (NSTEMI) myocardial infarction: Secondary | ICD-10-CM | POA: Diagnosis present

## 2022-11-23 DIAGNOSIS — M797 Fibromyalgia: Secondary | ICD-10-CM | POA: Diagnosis present

## 2022-11-23 DIAGNOSIS — I2510 Atherosclerotic heart disease of native coronary artery without angina pectoris: Secondary | ICD-10-CM | POA: Diagnosis present

## 2022-11-23 DIAGNOSIS — R0689 Other abnormalities of breathing: Secondary | ICD-10-CM | POA: Diagnosis not present

## 2022-11-23 DIAGNOSIS — Z8249 Family history of ischemic heart disease and other diseases of the circulatory system: Secondary | ICD-10-CM | POA: Diagnosis not present

## 2022-11-23 DIAGNOSIS — I2489 Other forms of acute ischemic heart disease: Secondary | ICD-10-CM | POA: Diagnosis present

## 2022-11-23 DIAGNOSIS — I871 Compression of vein: Secondary | ICD-10-CM | POA: Diagnosis present

## 2022-11-23 DIAGNOSIS — I480 Paroxysmal atrial fibrillation: Secondary | ICD-10-CM | POA: Diagnosis present

## 2022-11-23 DIAGNOSIS — Z7952 Long term (current) use of systemic steroids: Secondary | ICD-10-CM | POA: Diagnosis not present

## 2022-11-23 DIAGNOSIS — R072 Precordial pain: Secondary | ICD-10-CM | POA: Diagnosis not present

## 2022-11-23 LAB — COMPREHENSIVE METABOLIC PANEL
ALT: 22 U/L (ref 0–44)
AST: 31 U/L (ref 15–41)
Albumin: 3.3 g/dL — ABNORMAL LOW (ref 3.5–5.0)
Alkaline Phosphatase: 73 U/L (ref 38–126)
Anion gap: 5 (ref 5–15)
BUN: 20 mg/dL (ref 6–20)
CO2: 26 mmol/L (ref 22–32)
Calcium: 7.9 mg/dL — ABNORMAL LOW (ref 8.9–10.3)
Chloride: 108 mmol/L (ref 98–111)
Creatinine, Ser: 0.79 mg/dL (ref 0.44–1.00)
GFR, Estimated: >60 mL/min (ref >60)
Glucose, Bld: 131 mg/dL — ABNORMAL HIGH (ref 70–99)
Potassium: 3.9 mmol/L (ref 3.5–5.1)
Sodium: 139 mmol/L (ref 135–145)
Total Bilirubin: 0.4 mg/dL (ref 0.3–1.2)
Total Protein: 6 g/dL — ABNORMAL LOW (ref 6.5–8.1)

## 2022-11-23 LAB — CBC WITH DIFFERENTIAL/PLATELET
Abs Immature Granulocytes: 0.05 10*3/uL (ref 0.00–0.07)
Basophils Absolute: 0 10*3/uL (ref 0.0–0.1)
Basophils Relative: 0 %
Eosinophils Absolute: 0.1 10*3/uL (ref 0.0–0.5)
Eosinophils Relative: 0 %
HCT: 42.8 % (ref 36.0–46.0)
Hemoglobin: 13.6 g/dL (ref 12.0–15.0)
Immature Granulocytes: 0 %
Lymphocytes Relative: 23 %
Lymphs Abs: 3.3 10*3/uL (ref 0.7–4.0)
MCH: 28.9 pg (ref 26.0–34.0)
MCHC: 31.8 g/dL (ref 30.0–36.0)
MCV: 90.9 fL (ref 80.0–100.0)
Monocytes Absolute: 0.5 10*3/uL (ref 0.1–1.0)
Monocytes Relative: 4 %
Neutro Abs: 10.2 10*3/uL — ABNORMAL HIGH (ref 1.7–7.7)
Neutrophils Relative %: 73 %
Platelets: 364 10*3/uL (ref 150–400)
RBC: 4.71 MIL/uL (ref 3.87–5.11)
RDW: 13.1 % (ref 11.5–15.5)
WBC: 14.2 10*3/uL — ABNORMAL HIGH (ref 4.0–10.5)
nRBC: 0 % (ref 0.0–0.2)

## 2022-11-23 LAB — MAGNESIUM: Magnesium: 2.2 mg/dL (ref 1.7–2.4)

## 2022-11-23 LAB — HIV ANTIBODY (ROUTINE TESTING W REFLEX): HIV Screen 4th Generation wRfx: NONREACTIVE

## 2022-11-23 LAB — TROPONIN I (HIGH SENSITIVITY)
Troponin I (High Sensitivity): 5817 ng/L (ref <18)
Troponin I (High Sensitivity): 12302 ng/L (ref <18)
Troponin I (High Sensitivity): 14308 ng/L (ref <18)
Troponin I (High Sensitivity): 13956 ng/L (ref <18)
Troponin I (High Sensitivity): 9362 ng/L (ref <18)

## 2022-11-23 LAB — URINALYSIS, COMPLETE (UACMP) WITH MICROSCOPIC
Bacteria, UA: NONE SEEN
Bilirubin Urine: NEGATIVE
Glucose, UA: NEGATIVE mg/dL
Hgb urine dipstick: NEGATIVE
Ketones, ur: NEGATIVE mg/dL
Leukocytes,Ua: NEGATIVE
Nitrite: NEGATIVE
Protein, ur: NEGATIVE mg/dL
Specific Gravity, Urine: 1.016 (ref 1.005–1.030)
pH: 5 (ref 5.0–8.0)

## 2022-11-23 LAB — T4, FREE: Free T4: 0.94 ng/dL (ref 0.61–1.12)

## 2022-11-23 MED ORDER — FLUOXETINE HCL 20 MG PO CAPS
40.0000 mg | ORAL_CAPSULE | Freq: Two times a day (BID) | ORAL | Status: DC
  Administered 2022-11-23 – 2022-11-25 (×5): 40 mg via ORAL
  Filled 2022-11-23 (×5): qty 2

## 2022-11-23 MED ORDER — ASPIRIN 81 MG PO CHEW
81.0000 mg | CHEWABLE_TABLET | ORAL | Status: AC
  Administered 2022-11-24: 81 mg via ORAL
  Filled 2022-11-23: qty 1

## 2022-11-23 MED ORDER — ROSUVASTATIN CALCIUM 20 MG PO TABS
20.0000 mg | ORAL_TABLET | Freq: Every day | ORAL | Status: DC
  Administered 2022-11-23 – 2022-11-24 (×2): 20 mg via ORAL
  Filled 2022-11-23 (×2): qty 1

## 2022-11-23 MED ORDER — ROSUVASTATIN CALCIUM 10 MG PO TABS: 10.0000 mg | ORAL_TABLET | Freq: Every day | ORAL | Status: DC

## 2022-11-23 MED ORDER — PYRIDOSTIGMINE BROMIDE 60 MG PO TABS
60.0000 mg | ORAL_TABLET | Freq: Three times a day (TID) | ORAL | Status: DC
  Administered 2022-11-23 – 2022-11-25 (×7): 60 mg via ORAL
  Filled 2022-11-23 (×8): qty 1

## 2022-11-23 MED ORDER — BUSPIRONE HCL 5 MG PO TABS
15.0000 mg | ORAL_TABLET | Freq: Every day | ORAL | Status: DC | PRN
  Administered 2022-11-25: 15 mg via ORAL
  Filled 2022-11-23: qty 1

## 2022-11-23 MED ORDER — PROCHLORPERAZINE EDISYLATE 10 MG/2ML IJ SOLN
10.0000 mg | Freq: Four times a day (QID) | INTRAMUSCULAR | Status: DC | PRN
  Administered 2022-11-23: 5 mg via INTRAVENOUS
  Filled 2022-11-23: qty 2

## 2022-11-23 MED ORDER — ASPIRIN 81 MG PO CHEW
81.0000 mg | CHEWABLE_TABLET | Freq: Every day | ORAL | Status: DC
  Administered 2022-11-25: 81 mg via ORAL
  Filled 2022-11-23: qty 1

## 2022-11-23 MED ORDER — HEPARIN (PORCINE) 25000 UT/250ML-% IV SOLN
1050.0000 [IU]/h | INTRAVENOUS | Status: DC
  Administered 2022-11-24: 1050 [IU]/h via INTRAVENOUS
  Filled 2022-11-23: qty 250

## 2022-11-23 MED ORDER — PANTOPRAZOLE SODIUM 40 MG PO TBEC
40.0000 mg | DELAYED_RELEASE_TABLET | Freq: Two times a day (BID) | ORAL | Status: DC
  Administered 2022-11-23 – 2022-11-25 (×5): 40 mg via ORAL
  Filled 2022-11-23 (×5): qty 1

## 2022-11-23 MED ORDER — NITROGLYCERIN IN D5W 200-5 MCG/ML-% IV SOLN
0.0000 ug/min | INTRAVENOUS | Status: DC
  Administered 2022-11-23: 2.5 ug/min via INTRAVENOUS
  Filled 2022-11-23: qty 250

## 2022-11-23 MED ORDER — TRAZODONE HCL 100 MG PO TABS: 100.0000 mg | ORAL_TABLET | Freq: Every evening | ORAL | Status: DC | PRN

## 2022-11-23 MED ORDER — ACETAMINOPHEN 325 MG PO TABS
650.0000 mg | ORAL_TABLET | Freq: Four times a day (QID) | ORAL | Status: DC | PRN
  Administered 2022-11-23 – 2022-11-25 (×3): 650 mg via ORAL
  Filled 2022-11-23 (×3): qty 2

## 2022-11-23 MED ORDER — MONTELUKAST SODIUM 10 MG PO TABS
10.0000 mg | ORAL_TABLET | Freq: Every evening | ORAL | Status: DC
  Administered 2022-11-23 – 2022-11-24 (×2): 10 mg via ORAL
  Filled 2022-11-23 (×2): qty 1

## 2022-11-23 MED ORDER — LOSARTAN POTASSIUM 25 MG PO TABS: 100.0000 mg | ORAL_TABLET | Freq: Every day | ORAL | Status: DC

## 2022-11-23 MED ORDER — OXYCODONE HCL 5 MG PO TABS
5.0000 mg | ORAL_TABLET | ORAL | Status: DC | PRN
  Administered 2022-11-23: 5 mg via ORAL
  Filled 2022-11-23: qty 1

## 2022-11-23 MED ORDER — MORPHINE SULFATE (PF) 4 MG/ML IV SOLN
4.0000 mg | Freq: Once | INTRAVENOUS | Status: AC
  Administered 2022-11-23: 4 mg via INTRAVENOUS
  Filled 2022-11-23: qty 1

## 2022-11-23 MED ORDER — ONDANSETRON HCL 4 MG/2ML IJ SOLN
4.0000 mg | Freq: Four times a day (QID) | INTRAMUSCULAR | Status: DC | PRN
  Administered 2022-11-23 (×2): 4 mg via INTRAVENOUS
  Filled 2022-11-23 (×2): qty 2

## 2022-11-23 MED ORDER — ASPIRIN 81 MG PO CHEW: 81.0000 mg | CHEWABLE_TABLET | Freq: Every day | ORAL | Status: DC

## 2022-11-23 MED ORDER — PREDNISONE 5 MG PO TABS
5.0000 mg | ORAL_TABLET | Freq: Every day | ORAL | Status: DC
  Administered 2022-11-23 – 2022-11-25 (×3): 5 mg via ORAL
  Filled 2022-11-23 (×3): qty 1

## 2022-11-23 MED ORDER — HYDROCHLOROTHIAZIDE 12.5 MG PO CAPS: 12.5000 mg | ORAL_CAPSULE | Freq: Every day | ORAL | Status: DC

## 2022-11-23 MED ORDER — POTASSIUM CHLORIDE 10 MEQ/100ML IV SOLN
10.0000 meq | INTRAVENOUS | Status: AC
  Administered 2022-11-23 (×4): 10 meq via INTRAVENOUS
  Filled 2022-11-23 (×4): qty 100

## 2022-11-23 MED ORDER — ACETAMINOPHEN 650 MG RE SUPP: 650.0000 mg | Freq: Four times a day (QID) | RECTAL | Status: DC | PRN

## 2022-11-23 MED ORDER — ASPIRIN 81 MG PO CHEW
324.0000 mg | CHEWABLE_TABLET | Freq: Once | ORAL | Status: AC
  Administered 2022-11-23: 324 mg via ORAL
  Filled 2022-11-23: qty 4

## 2022-11-23 MED ORDER — MORPHINE SULFATE (PF) 2 MG/ML IV SOLN
2.0000 mg | INTRAVENOUS | Status: DC | PRN
  Administered 2022-11-23 (×2): 2 mg via INTRAVENOUS
  Filled 2022-11-23 (×2): qty 1

## 2022-11-23 MED ORDER — ONDANSETRON HCL 4 MG PO TABS: 4.0000 mg | ORAL_TABLET | Freq: Four times a day (QID) | ORAL | Status: DC | PRN

## 2022-11-23 MED ORDER — MILNACIPRAN HCL 50 MG PO TABS
50.0000 mg | ORAL_TABLET | Freq: Two times a day (BID) | ORAL | Status: DC
  Filled 2022-11-23 (×6): qty 1

## 2022-11-23 MED ORDER — FAMOTIDINE 20 MG PO TABS
40.0000 mg | ORAL_TABLET | Freq: Two times a day (BID) | ORAL | Status: DC
  Administered 2022-11-23 – 2022-11-25 (×5): 40 mg via ORAL
  Filled 2022-11-23 (×5): qty 2

## 2022-11-23 NOTE — ED Notes (Signed)
NV improved. BP soft. CP increasing when ntg paused. Restarted at low dose/rate. Admitting and cards notified. Alert, NAD, calm, interactive. Repeat EKG completed.
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
## 2022-11-23 NOTE — Assessment & Plan Note (Signed)
-   Troponin initially 200s and then 5000 then 12,000 and now 14,000 - Cardiology consulted from the ED and initially recommended the patient could stay here any pain but when patient started to have a recurrence of her chest pain they advised admission to [facility] - Telemetry bed requested at [facility] - Continue heparin drip - Continue ARB, statin - EKG showed a heart rate of 103, QTc 443, sinus tach - When she initially came in she was in A-fib with RVR and had to be cardioverted, so demand ischemia is definitely contributing to the uptrend in troponins - Currently chest pain-free - Monitor on telemetry

## 2022-11-23 NOTE — Assessment & Plan Note (Signed)
-   History of DVTs and PE - On Xarelto at baseline - Holding Xarelto given heparin drip due to NSTEMI

## 2022-11-23 NOTE — Progress Notes (Signed)
Pt reports she lives with a roommate and is independent with ADLs. No current home health services. TOC will follow.    11/23/22 0725  TOC Brief Assessment  Insurance and Status Reviewed  Patient has primary care physician Yes  Home environment has been reviewed Lives with roommate.  Prior level of function: Independent.  Prior/Current Home Services No current home services  Social Determinants of Health Reivew SDOH reviewed no interventions necessary  Readmission risk has been reviewed Yes  Transition of care needs no transition of care needs at this time

## 2022-11-23 NOTE — Assessment & Plan Note (Signed)
-   Continue hydrochlorothiazide, losartan ?

## 2022-11-23 NOTE — ED Notes (Signed)
Pt 91-93% on room air. Placed on 2 L nasal cannula with improvement. SpO2 now 96% on supplemental oxygen.  EDP informed.

## 2022-11-23 NOTE — ED Notes (Signed)
Electronic consent completed. EDP, RT, and Melissa RN at bedside

## 2022-11-23 NOTE — Progress Notes (Signed)
Patient seen and examined; admitted after midnight secondary to chest pain and elevated troponin.  Patient with underlying history of hypertension, myasthenia gravis, DVT on chronic anticoagulation and May-Thurner syndrome.  On presentation she had A-fib with RVR and found with significant elevation of troponin along with chest pain meeting criteria for NSTEMI.  Patient with a heart score of 4-5 based on her risk factors.  After receiving narcotics and nitroglycerin sprays some improvement in her chest discomfort.  Vital signs for the most part stable but demonstrating soft blood pressure.  No fever, 2 L nasal cannula supplementation in place.  Please refer to H&P written by Dr. Carren Rang on 11/23/22 for further info/details on admission.  Plan: -Patient will be admitted to progressive bed and [facility] for further evaluation and management of her NSTEMI; cardiology is aware and on board with anticipated cardiac cath down the road. -Continue plans for heparin drip (eventually, she received xarelto at midnight) and nitroglycerin drip -Continue treatment with aspirin and statin. -Continue oxygen supplementation -Follow 2D echo results.  Vassie Loll MD [phone]

## 2022-11-23 NOTE — ED Notes (Addendum)
Pain improved with oxycodone, but started feeling faint, light headed. BP soft. 99/56. Will start ntg low. VSS. Recent trop improved. IVF kvo.
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
## 2022-11-23 NOTE — Assessment & Plan Note (Signed)
-   Continue heparin drip - Echo in the a.m. - Cardiology consulted - Currently in sinus after cardioversion

## 2022-11-23 NOTE — Assessment & Plan Note (Signed)
-   Continue BuSpar 

## 2022-11-23 NOTE — Assessment & Plan Note (Signed)
-   Continue BuSpar, Prozac

## 2022-11-23 NOTE — Progress Notes (Signed)
   Went to reassess the patient as she is currently awaiting a bed at [facility]. She reports her chest pain significantly improved after being started on IV NTG. No current headaches. Will obtain an updated 12-Lead EKG. Says she did have nausea and vomiting earlier this morning. Could be due to her MI or having received several pain medications (Morphine and Oxycodone) as this started after she received Morphine. Received a 2nd dose of Zofran approximately 1 hour ago with some improvement. Says she has not ate since yesterday and usually takes medications with food. Saltines and Ginger Ale provided. Once she is able, would be able to eat today but will need to be NPO after midnight for her cardiac catheterization.   Signed, Ellsworth Lennox, PA-C 11/23/2022, 12:33 PM
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
## 2022-11-23 NOTE — ED Notes (Signed)
Pt placed on bedpan

## 2022-11-23 NOTE — Assessment & Plan Note (Signed)
-   Potassium 3.3 - Could be contributing to arrhythmia - Replace and recheck

## 2022-11-23 NOTE — ED Notes (Addendum)
DO admitting at [facility]. Supports starting ntg gtt low.

## 2022-11-23 NOTE — Progress Notes (Signed)
RT present for conscious sedation for cardioversion. VSS throughout procedure. ETCO2 monitored throughout and 6 lpm nasal cannula applied. Patient tolerated well with no adverse events noted.

## 2022-11-23 NOTE — ED Notes (Signed)
Repeat EKG obtained per edp order for pt who called out c/o ongoing centralized chest pain worsening with deep breaths and associated with SOB. No additional orders from edp

## 2022-11-23 NOTE — Assessment & Plan Note (Signed)
-   Continue prednisone and pyridostigmine
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
## 2022-11-24 ENCOUNTER — Inpatient Hospital Stay (HOSPITAL_COMMUNITY): Payer: Medicare HMO

## 2022-11-24 ENCOUNTER — Ambulatory Visit (HOSPITAL_COMMUNITY): Admission: RE | Admit: 2022-11-24 | Payer: Medicare HMO | Source: Home / Self Care | Admitting: Cardiovascular Disease

## 2022-11-24 ENCOUNTER — Inpatient Hospital Stay (HOSPITAL_COMMUNITY)
Admission: EM | Disposition: A | Discharge status: Home / Self Care | Payer: Self-pay | Source: Home / Self Care | Attending: Internal Medicine

## 2022-11-24 DIAGNOSIS — I4891 Unspecified atrial fibrillation: Secondary | ICD-10-CM | POA: Diagnosis not present

## 2022-11-24 DIAGNOSIS — R072 Precordial pain: Secondary | ICD-10-CM | POA: Diagnosis not present

## 2022-11-24 DIAGNOSIS — R7989 Other specified abnormal findings of blood chemistry: Secondary | ICD-10-CM | POA: Diagnosis not present

## 2022-11-24 DIAGNOSIS — I214 Non-ST elevation (NSTEMI) myocardial infarction: Secondary | ICD-10-CM | POA: Diagnosis not present

## 2022-11-24 HISTORY — PX: LEFT HEART CATH AND CORONARY ANGIOGRAPHY: CATH118249

## 2022-11-24 LAB — ECHOCARDIOGRAM COMPLETE
AR max vel: 3.26 cm2
AV Area VTI: 3.49 cm2
AV Area mean vel: 2.8 cm2
AV Mean grad: 4 mmHg
AV Peak grad: 7.2 mmHg
Ao pk vel: 1.34 m/s
Area-P 1/2: 3.72 cm2
Height: 64 in
S' Lateral: 3 cm
Weight: 3372.16 [oz_av]

## 2022-11-24 LAB — CBC
HCT: 39.5 % (ref 36.0–46.0)
Hemoglobin: 12.3 g/dL (ref 12.0–15.0)
MCH: 28.2 pg (ref 26.0–34.0)
MCHC: 31.1 g/dL (ref 30.0–36.0)
MCV: 90.6 fL (ref 80.0–100.0)
Platelets: 249 10*3/uL (ref 150–400)
RBC: 4.36 MIL/uL (ref 3.87–5.11)
RDW: 12.9 % (ref 11.5–15.5)
WBC: 10.6 10*3/uL — ABNORMAL HIGH (ref 4.0–10.5)
nRBC: 0 % (ref 0.0–0.2)

## 2022-11-24 LAB — LIPID PANEL
Cholesterol: 113 mg/dL (ref 0–200)
HDL: 62 mg/dL (ref >40)
LDL Cholesterol: 44 mg/dL (ref 0–99)
Total CHOL/HDL Ratio: 1.8 ratio
Triglycerides: 34 mg/dL (ref <150)
VLDL: 7 mg/dL (ref 0–40)

## 2022-11-24 LAB — BASIC METABOLIC PANEL
Anion gap: 11 (ref 5–15)
BUN: 8 mg/dL (ref 6–20)
CO2: 23 mmol/L (ref 22–32)
Calcium: 8.2 mg/dL — ABNORMAL LOW (ref 8.9–10.3)
Chloride: 105 mmol/L (ref 98–111)
Creatinine, Ser: 0.77 mg/dL (ref 0.44–1.00)
GFR, Estimated: >60 mL/min (ref >60)
Glucose, Bld: 106 mg/dL — ABNORMAL HIGH (ref 70–99)
Potassium: 4 mmol/L (ref 3.5–5.1)
Sodium: 139 mmol/L (ref 135–145)

## 2022-11-24 SURGERY — LEFT HEART CATH AND CORONARY ANGIOGRAPHY
Anesthesia: LOCAL

## 2022-11-24 MED ORDER — RIVAROXABAN 20 MG PO TABS
20.0000 mg | ORAL_TABLET | Freq: Every day | ORAL | Status: DC
  Administered 2022-11-24: 20 mg via ORAL
  Filled 2022-11-24: qty 1

## 2022-11-24 MED ORDER — MIDAZOLAM HCL 2 MG/2ML IJ SOLN
INTRAMUSCULAR | Status: DC | PRN
  Administered 2022-11-24 (×2): 1 mg via INTRAVENOUS

## 2022-11-24 MED ORDER — LIDOCAINE HCL (PF) 1 % IJ SOLN
INTRAMUSCULAR | Status: DC | PRN
  Administered 2022-11-24: 10 mL via INTRADERMAL

## 2022-11-24 MED ORDER — SODIUM CHLORIDE 0.9% FLUSH: 3.0000 mL | INTRAVENOUS | Status: DC | PRN

## 2022-11-24 MED ORDER — SODIUM CHLORIDE 0.9 % IV SOLN: 250.0000 mL | INTRAVENOUS | Status: DC | PRN

## 2022-11-24 MED ORDER — SODIUM CHLORIDE 0.9 % WEIGHT BASED INFUSION
1.0000 mL/kg/h | INTRAVENOUS | Status: DC
  Administered 2022-11-24: 1 mL/kg/h via INTRAVENOUS

## 2022-11-24 MED ORDER — SODIUM CHLORIDE 0.9% FLUSH
3.0000 mL | Freq: Two times a day (BID) | INTRAVENOUS | Status: DC
  Administered 2022-11-24 – 2022-11-25 (×2): 3 mL via INTRAVENOUS

## 2022-11-24 MED ORDER — SODIUM CHLORIDE 0.9 % WEIGHT BASED INFUSION: 1.0000 mL/kg/h | INTRAVENOUS | Status: DC

## 2022-11-24 MED ORDER — SODIUM CHLORIDE 0.9 % IV SOLN: INTRAVENOUS | Status: AC

## 2022-11-24 MED ORDER — SODIUM CHLORIDE 0.9 % WEIGHT BASED INFUSION
3.0000 mL/kg/h | INTRAVENOUS | Status: DC
  Administered 2022-11-24: 3 mL/kg/h via INTRAVENOUS

## 2022-11-24 MED ORDER — HYDRALAZINE HCL 20 MG/ML IJ SOLN: 10.0000 mg | INTRAMUSCULAR | Status: AC | PRN

## 2022-11-24 MED ORDER — IOHEXOL 350 MG/ML SOLN
INTRAVENOUS | Status: DC | PRN
  Administered 2022-11-24: 35 mL

## 2022-11-24 MED ORDER — HEPARIN SODIUM (PORCINE) 1000 UNIT/ML IJ SOLN
INTRAMUSCULAR | Status: AC
  Filled 2022-11-24: qty 10

## 2022-11-24 MED ORDER — MIDAZOLAM HCL 2 MG/2ML IJ SOLN
INTRAMUSCULAR | Status: AC
  Filled 2022-11-24: qty 2

## 2022-11-24 MED ORDER — FENTANYL CITRATE (PF) 100 MCG/2ML IJ SOLN
INTRAMUSCULAR | Status: AC
  Filled 2022-11-24: qty 2

## 2022-11-24 MED ORDER — SODIUM CHLORIDE 0.9 % WEIGHT BASED INFUSION: 3.0000 mL/kg/h | INTRAVENOUS | Status: DC

## 2022-11-24 MED ORDER — LIDOCAINE HCL (PF) 1 % IJ SOLN
INTRAMUSCULAR | Status: AC
  Filled 2022-11-24: qty 30

## 2022-11-24 MED ORDER — HEPARIN (PORCINE) IN NACL 1000-0.9 UT/500ML-% IV SOLN
INTRAVENOUS | Status: DC | PRN
  Administered 2022-11-24 (×2): 500 mL

## 2022-11-24 MED ORDER — VERAPAMIL HCL 2.5 MG/ML IV SOLN
INTRAVENOUS | Status: AC
  Filled 2022-11-24: qty 2

## 2022-11-24 MED ORDER — LIDOCAINE HCL (PF) 1 % IJ SOLN
INTRAMUSCULAR | Status: DC | PRN
  Administered 2022-11-24: 2 mL

## 2022-11-24 MED ORDER — FENTANYL CITRATE (PF) 100 MCG/2ML IJ SOLN
INTRAMUSCULAR | Status: DC | PRN
  Administered 2022-11-24 (×2): 25 ug via INTRAVENOUS

## 2022-11-24 SURGICAL SUPPLY — 13 items
CATH INFINITI 5FR MULTPACK ANG (CATHETERS) IMPLANT
CLOSURE MYNX CONTROL 5F (Vascular Products) IMPLANT
GLIDESHEATH SLEND SS 6F .021 (SHEATH) IMPLANT
GUIDEWIRE INQWIRE 1.5J.035X260 (WIRE) IMPLANT
INQWIRE 1.5J .035X260CM (WIRE) ×1
KIT MICROPUNCTURE NIT STIFF (SHEATH) IMPLANT
PACK CARDIAC CATHETERIZATION (CUSTOM PROCEDURE TRAY) ×1 IMPLANT
PROTECTION STATION PRESSURIZED (MISCELLANEOUS) ×1
SET ATX-X65L (MISCELLANEOUS) IMPLANT
SHEATH PINNACLE 5F 10CM (SHEATH) IMPLANT
SHEATH PROBE COVER 6X72 (BAG) IMPLANT
STATION PROTECTION PRESSURIZED (MISCELLANEOUS) IMPLANT
WIRE EMERALD 3MM-J .035X150CM (WIRE) IMPLANT
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
## 2022-11-24 NOTE — H&P (View-Only) (Signed)
Rounding Note    Patient Name: Theresa Lucas Date of Encounter: 11/24/2022  [facility] Cardiologist: None   Subjective   Still with mild chest discomfort, but seems pleuritic in nature.   Inpatient Medications    Scheduled Meds:  [START ON 11/25/2022] aspirin  81 mg Oral Daily   famotidine  40 mg Oral BID   FLUoxetine  40 mg Oral BID   Milnacipran  50 mg Oral BID   montelukast  10 mg Oral QPM   pantoprazole  40 mg Oral BID   predniSONE  5 mg Oral Q breakfast   pyridostigmine  60 mg Oral TID   rosuvastatin  20 mg Oral QHS   Continuous Infusions:  sodium chloride 1 mL/kg/hr (11/24/22 0849)   heparin 1,050 Units/hr (11/24/22 0038)   nitroGLYCERIN 2.5 mcg/min (11/23/22 1329)   PRN Meds: acetaminophen **OR** acetaminophen, busPIRone, morphine injection, ondansetron **OR** ondansetron (ZOFRAN) IV, oxyCODONE, prochlorperazine, traZODone   Vital Signs    Vitals:   11/24/22 0100 11/24/22 0105 11/24/22 0420 11/24/22 0752  BP: (!) 102/53  (!) 101/52 (!) 98/59  Pulse: 77 71 81 83  Resp: 18  18 19   Temp: 98 F (36.7 C)  98 F (36.7 C) 99.4 F (37.4 C)  TempSrc: Oral  Oral Oral  SpO2: 98%  97% 100%  Weight:   95.6 kg   Height:        Intake/Output Summary (Last 24 hours) at 11/24/2022 1012 Last data filed at 11/24/2022 0900 Gross per 24 hour  Intake 2.7 ml  Output 600 ml  Net -597.3 ml      11/24/2022    4:20 AM 11/23/2022    5:22 PM 11/22/2022    9:53 PM  Last 3 Weights  Weight (lbs) 210 lb 12.2 oz 203 lb 14.8 oz 205 lb  Weight (kg) 95.6 kg 92.5 kg 92.987 kg      Telemetry    Sinus Rhythm - Personally Reviewed  Physical Exam   GEN: No acute distress.   Neck: No JVD Cardiac: RRR, no murmurs, rubs, or gallops.  Respiratory: Clear to auscultation bilaterally. GI: Soft, nontender, non-distended  MS: No edema; No deformity. Neuro:  Nonfocal  Psych: Normal affect   Labs    High Sensitivity Troponin:   Recent Labs  Lab 11/22/22 2347  11/23/22 0226 11/23/22 0504 11/23/22 0739 11/23/22 0955  TROPONINIHS 5,817* 12,302* 14,308* 13,956* 9,362*     Chemistry Recent Labs  Lab 11/22/22 2154 11/23/22 0504  NA 135 139  K 3.3* 3.9  CL 102 108  CO2 25 26  GLUCOSE 143* 131*  BUN 21* 20  CREATININE 1.00 0.79  CALCIUM 8.2* 7.9*  MG 2.2 2.2  PROT  --  6.0*  ALBUMIN  --  3.3*  AST  --  31  ALT  --  22  ALKPHOS  --  73  BILITOT  --  0.4  GFRNONAA >60 >60  ANIONGAP 8 5    Lipids  Recent Labs  Lab 11/24/22 0346  CHOL 113  TRIG 34  HDL 62  LDLCALC 44  CHOLHDL 1.8    Hematology Recent Labs  Lab 11/22/22 2154 11/23/22 0504  WBC 11.9* 14.2*  RBC 4.92 4.71  HGB 14.4 13.6  HCT 44.3 42.8  MCV 90.0 90.9  MCH 29.3 28.9  MCHC 32.5 31.8  RDW 13.0 13.1  PLT 357 364   Thyroid  Recent Labs  Lab 11/22/22 2154 11/22/22 2347  TSH 0.803  --  FREET4  --  0.94    BNPNo results for input(s): "BNP", "PROBNP" in the last 168 hours.  DDimer No results for input(s): "DDIMER" in the last 168 hours.   Radiology    DG Chest Port 1 View  Result Date: 11/22/2022 CLINICAL DATA:  Chest pain, hypertension EXAM: PORTABLE CHEST 1 VIEW COMPARISON:  None Available. FINDINGS: The heart size and mediastinal contours are within normal limits. Both lungs are clear. The visualized skeletal structures are unremarkable. IMPRESSION: No active disease. Electronically Signed   By: Helyn Numbers M.D.   On: 11/22/2022 22:51    Cardiac Studies   Echo: pending  Patient Profile     62 y.o. female  with a hx of HTN, myasthenia gravis, May Thurner syndrome and history of DVT who is being seen 11/23/2022 for the evaluation of NSTEMI and atrial fibrillation with RVR at the request of Dr. Carren Rang.   Assessment & Plan   NSTEMI -- Initial enzyme elevation was thought to be secondary to demand ischemia but enzymes have trended up significantly and peaked at 14,308. While her NSTEMI could be secondary to a stress-induced cardiomyopathy  given her presentation, need to rule out obstructive coronary disease. Planned for cardiac cath today -- Echo read is pending -- remains on IV heparin, NTG, ASA, statin   New-onset Atrial Fibrillation -- new diagnosis for the patient and she did undergo DCCV while in the ED and is maintaining normal sinus rhythm at this time. K+ was low but has been replaced. TSH WNL.  -- She was on Xarelto prior to admission, currently held with plans for cath. On IV heparin   HTN -- BPs have been soft, holding hydrochlorothiazide and losartan for now while on IV NTG    Hypokalemia --  resolved   May Thurner Syndrome/History of DVT -- has been on long-term anticoagulation. Xarelto currently held with plans to bridge with Heparin given upcoming cardiac catheterization.     Myasthenia Gravis -- She has been continued on Prednisone and Pyridostigmine.  -- per primary   HLD -- LDL 44, HDL 62 -- on Crestor 20mg  daily   For questions or updates, please contact [facility] Please consult [email or web address] for contact info under   Signed, Laverda Page, NP  11/24/2022, 10:12 AM    Patient seen, examined. Available data reviewed. Agree with findings, assessment, and plan as outlined by Laverda Page, NP.  The patient is independently interviewed and examined.  She is alert, oriented, no distress.  HEENT is normal, JVP is normal, lungs are clear bilaterally, heart is regular rate and rhythm no murmur gallop, abdomen soft and nontender, right radial pulses 2+.  The patient had atrial fibrillation with RVR complicated by non-STEMI.  She had significant chest pain associated with this and her troponin peaked at almost 14,000.  The patient is now having mild pleuritic type discomfort but feels much better back in sinus rhythm after undergoing cardioversion.  Rivaroxaban is on hold and she has been receiving IV heparin.  Plans for cardiac catheterization today and I agree with the definitive evaluation in  the setting.  She is at high risk for obstructive CAD. I have reviewed the risks, indications, and alternatives to cardiac catheterization, possible angioplasty, and stenting with the patient. Risks include but are not limited to bleeding, infection, vascular injury, stroke, myocardial infection, arrhythmia, kidney injury, radiation-related injury in the case of prolonged fluoroscopy use, emergency cardiac surgery, and death. The patient understands the risks of serious complication is 1-2  in 1000 with diagnostic cardiac cath and 1-2% or less with angioplasty/stenting.    Tonny Bollman, M.D. 11/24/2022 11:54 AM

## 2022-11-24 NOTE — Progress Notes (Signed)
   11/24/22 1150  Spiritual Encounters  Care provided to: Pt and family  Reason for visit Advance directives  OnCall Visit No  Spiritual Framework  Presenting Themes Rituals and practive  Interventions  Spiritual Care Interventions Made Prayer   Responded to spiritual consult for advance directive. Provided education to patient and her fiance. Patient will review and discuss desires with fiance and second agent before returning. Provided prayer for patient and fiance.

## 2022-11-24 NOTE — Consult Note (Addendum)
[facility]  [facility] Inpatient Consult   11/24/2022  Adisynne Staggs [birth year] [id number]  Primary Care Provider: Richardean Chimera, MD with [facility] is listed for post hospital follow up needs.    [facility] Liaison met patient at bedside at [facility].  Insurance: [facility] HMO   The patient was screened for new diagnosis of NSTEMI and AF as presented in morning progression rounds..  The patient was assessed for potential [facility]) Care Management service needs for post hospital transition for care coordination. Review of patient's electronic medical record reveals patient is from home. Met with patient and states friend at the bedside to explain and assess for potential post hospital follow up needs.  Cardiac team came in during interview this writer obliged them and will follow patient for progress and community needs.  Plan: [facility] Liaison will continue to follow progress and disposition to asess for post hospital community care coordination/management needs.  Referral request for community care coordination: following as needed   [facility] Care Management/Population Health does not replace or interfere with any arrangements made by the Inpatient Transition of Care team.   For questions contact:   Charlesetta Shanks, RN, BSN, CCM [place]  [facility], [facility] Liaison Direct Dial: [phone] or secure chat Website: [email or web address]

## 2022-11-24 NOTE — Plan of Care (Signed)
  Problem: Education: Goal: Knowledge of disease or condition will improve Outcome: Progressing Goal: Understanding of medication regimen will improve Outcome: Progressing Goal: Individualized Educational Video(s) Outcome: Progressing   Problem: Activity: Goal: Ability to tolerate increased activity will improve Outcome: Progressing   Problem: Cardiac: Goal: Ability to achieve and maintain adequate cardiopulmonary perfusion will improve Outcome: Progressing   Problem: Health Behavior/Discharge Planning: Goal: Ability to safely manage health-related needs after discharge will improve Outcome: Progressing   Problem: Education: Goal: Understanding of CV disease, CV risk reduction, and recovery process will improve Outcome: Progressing Goal: Individualized Educational Video(s) Outcome: Progressing   Problem: Activity: Goal: Ability to return to baseline activity level will improve Outcome: Progressing   Problem: Cardiovascular: Goal: Ability to achieve and maintain adequate cardiovascular perfusion will improve Outcome: Progressing Goal: Vascular access site(s) Level 0-1 will be maintained Outcome: Progressing   Problem: Health Behavior/Discharge Planning: Goal: Ability to safely manage health-related needs after discharge will improve Outcome: Progressing   Problem: Education: Goal: Knowledge of General Education information will improve Description: Including pain rating scale, medication(s)/side effects and non-pharmacologic comfort measures Outcome: Progressing   Problem: Health Behavior/Discharge Planning: Goal: Ability to manage health-related needs will improve Outcome: Progressing   Problem: Clinical Measurements: Goal: Ability to maintain clinical measurements within normal limits will improve Outcome: Progressing Goal: Will remain free from infection Outcome: Progressing Goal: Diagnostic test results will improve Outcome: Progressing Goal: Respiratory  complications will improve Outcome: Progressing Goal: Cardiovascular complication will be avoided Outcome: Progressing   Problem: Activity: Goal: Risk for activity intolerance will decrease Outcome: Progressing   Problem: Nutrition: Goal: Adequate nutrition will be maintained Outcome: Progressing   Problem: Coping: Goal: Level of anxiety will decrease Outcome: Progressing   Problem: Elimination: Goal: Will not experience complications related to bowel motility Outcome: Progressing Goal: Will not experience complications related to urinary retention Outcome: Progressing   Problem: Pain Managment: Goal: General experience of comfort will improve Outcome: Progressing   Problem: Safety: Goal: Ability to remain free from injury will improve Outcome: Progressing   Problem: Skin Integrity: Goal: Risk for impaired skin integrity will decrease Outcome: Progressing

## 2022-11-24 NOTE — Progress Notes (Addendum)
Rounding Note    Patient Name: Theresa Lucas Date of Encounter: 11/24/2022  [facility] Cardiologist: None   Subjective   Still with mild chest discomfort, but seems pleuritic in nature.   Inpatient Medications    Scheduled Meds:  [START ON 11/25/2022] aspirin  81 mg Oral Daily   famotidine  40 mg Oral BID   FLUoxetine  40 mg Oral BID   Milnacipran  50 mg Oral BID   montelukast  10 mg Oral QPM   pantoprazole  40 mg Oral BID   predniSONE  5 mg Oral Q breakfast   pyridostigmine  60 mg Oral TID   rosuvastatin  20 mg Oral QHS   Continuous Infusions:  sodium chloride 1 mL/kg/hr (11/24/22 0849)   heparin 1,050 Units/hr (11/24/22 0038)   nitroGLYCERIN 2.5 mcg/min (11/23/22 1329)   PRN Meds: acetaminophen **OR** acetaminophen, busPIRone, morphine injection, ondansetron **OR** ondansetron (ZOFRAN) IV, oxyCODONE, prochlorperazine, traZODone   Vital Signs    Vitals:   11/24/22 0100 11/24/22 0105 11/24/22 0420 11/24/22 0752  BP: (!) 102/53  (!) 101/52 (!) 98/59  Pulse: 77 71 81 83  Resp: 18  18 19   Temp: 98 F (36.7 C)  98 F (36.7 C) 99.4 F (37.4 C)  TempSrc: Oral  Oral Oral  SpO2: 98%  97% 100%  Weight:   95.6 kg   Height:        Intake/Output Summary (Last 24 hours) at 11/24/2022 1012 Last data filed at 11/24/2022 0900 Gross per 24 hour  Intake 2.7 ml  Output 600 ml  Net -597.3 ml      11/24/2022    4:20 AM 11/23/2022    5:22 PM 11/22/2022    9:53 PM  Last 3 Weights  Weight (lbs) 210 lb 12.2 oz 203 lb 14.8 oz 205 lb  Weight (kg) 95.6 kg 92.5 kg 92.987 kg      Telemetry    Sinus Rhythm - Personally Reviewed  Physical Exam   GEN: No acute distress.   Neck: No JVD Cardiac: RRR, no murmurs, rubs, or gallops.  Respiratory: Clear to auscultation bilaterally. GI: Soft, nontender, non-distended  MS: No edema; No deformity. Neuro:  Nonfocal  Psych: Normal affect   Labs    High Sensitivity Troponin:   Recent Labs  Lab 11/22/22 2347  11/23/22 0226 11/23/22 0504 11/23/22 0739 11/23/22 0955  TROPONINIHS 5,817* 12,302* 14,308* 13,956* 9,362*     Chemistry Recent Labs  Lab 11/22/22 2154 11/23/22 0504  NA 135 139  K 3.3* 3.9  CL 102 108  CO2 25 26  GLUCOSE 143* 131*  BUN 21* 20  CREATININE 1.00 0.79  CALCIUM 8.2* 7.9*  MG 2.2 2.2  PROT  --  6.0*  ALBUMIN  --  3.3*  AST  --  31  ALT  --  22  ALKPHOS  --  73  BILITOT  --  0.4  GFRNONAA >60 >60  ANIONGAP 8 5    Lipids  Recent Labs  Lab 11/24/22 0346  CHOL 113  TRIG 34  HDL 62  LDLCALC 44  CHOLHDL 1.8    Hematology Recent Labs  Lab 11/22/22 2154 11/23/22 0504  WBC 11.9* 14.2*  RBC 4.92 4.71  HGB 14.4 13.6  HCT 44.3 42.8  MCV 90.0 90.9  MCH 29.3 28.9  MCHC 32.5 31.8  RDW 13.0 13.1  PLT 357 364   Thyroid  Recent Labs  Lab 11/22/22 2154 11/22/22 2347  TSH 0.803  --  FREET4  --  0.94    BNPNo results for input(s): "BNP", "PROBNP" in the last 168 hours.  DDimer No results for input(s): "DDIMER" in the last 168 hours.   Radiology    DG Chest Port 1 View  Result Date: 11/22/2022 CLINICAL DATA:  Chest pain, hypertension EXAM: PORTABLE CHEST 1 VIEW COMPARISON:  None Available. FINDINGS: The heart size and mediastinal contours are within normal limits. Both lungs are clear. The visualized skeletal structures are unremarkable. IMPRESSION: No active disease. Electronically Signed   By: Helyn Numbers M.D.   On: 11/22/2022 22:51    Cardiac Studies   Echo: pending  Patient Profile     62 y.o. female  with a hx of HTN, myasthenia gravis, May Thurner syndrome and history of DVT who is being seen 11/23/2022 for the evaluation of NSTEMI and atrial fibrillation with RVR at the request of Dr. Carren Rang.   Assessment & Plan   NSTEMI -- Initial enzyme elevation was thought to be secondary to demand ischemia but enzymes have trended up significantly and peaked at 14,308. While her NSTEMI could be secondary to a stress-induced cardiomyopathy  given her presentation, need to rule out obstructive coronary disease. Planned for cardiac cath today -- Echo read is pending -- remains on IV heparin, NTG, ASA, statin   New-onset Atrial Fibrillation -- new diagnosis for the patient and she did undergo DCCV while in the ED and is maintaining normal sinus rhythm at this time. K+ was low but has been replaced. TSH WNL.  -- She was on Xarelto prior to admission, currently held with plans for cath. On IV heparin   HTN -- BPs have been soft, holding hydrochlorothiazide and losartan for now while on IV NTG    Hypokalemia --  resolved   May Thurner Syndrome/History of DVT -- has been on long-term anticoagulation. Xarelto currently held with plans to bridge with Heparin given upcoming cardiac catheterization.     Myasthenia Gravis -- She has been continued on Prednisone and Pyridostigmine.  -- per primary   HLD -- LDL 44, HDL 62 -- on Crestor 20mg  daily   For questions or updates, please contact [facility] Please consult [email or web address] for contact info under   Signed, Laverda Page, NP  11/24/2022, 10:12 AM    Patient seen, examined. Available data reviewed. Agree with findings, assessment, and plan as outlined by Laverda Page, NP.  The patient is independently interviewed and examined.  She is alert, oriented, no distress.  HEENT is normal, JVP is normal, lungs are clear bilaterally, heart is regular rate and rhythm no murmur gallop, abdomen soft and nontender, right radial pulses 2+.  The patient had atrial fibrillation with RVR complicated by non-STEMI.  She had significant chest pain associated with this and her troponin peaked at almost 14,000.  The patient is now having mild pleuritic type discomfort but feels much better back in sinus rhythm after undergoing cardioversion.  Rivaroxaban is on hold and she has been receiving IV heparin.  Plans for cardiac catheterization today and I agree with the definitive evaluation in  the setting.  She is at high risk for obstructive CAD. I have reviewed the risks, indications, and alternatives to cardiac catheterization, possible angioplasty, and stenting with the patient. Risks include but are not limited to bleeding, infection, vascular injury, stroke, myocardial infection, arrhythmia, kidney injury, radiation-related injury in the case of prolonged fluoroscopy use, emergency cardiac surgery, and death. The patient understands the risks of serious complication is 1-2  in 1000 with diagnostic cardiac cath and 1-2% or less with angioplasty/stenting.    Tonny Bollman, M.D. 11/24/2022 11:54 AM

## 2022-11-24 NOTE — Interval H&P Note (Signed)
History and Physical Interval Note:  11/24/2022 1:59 PM  Theresa Lucas  has presented today for surgery, with the diagnosis of non-stemi.  The various methods of treatment have been discussed with the patient and family. After consideration of risks, benefits and other options for treatment, the patient has consented to  Procedure(s): LEFT HEART CATH AND CORONARY ANGIOGRAPHY (N/A) as a surgical intervention.  The patient's history has been reviewed, patient examined, no change in status, stable for surgery.  I have reviewed the patient's chart and labs.  Questions were answered to the patient's satisfaction.    Cath Lab Visit (complete for each Cath Lab visit)  Clinical Evaluation Leading to the Procedure:   ACS: Yes.    Non-ACS:    Anginal Classification: CCS III  Anti-ischemic medical therapy: No Therapy  Non-Invasive Test Results: No non-invasive testing performed  Prior CABG: No previous CABG        Verne Carrow

## 2022-11-24 NOTE — Progress Notes (Signed)
   11/24/22 1325  Spiritual Encounters  Type of Visit Initial  Care provided to: Pt and family  Referral source Patient request  Reason for visit Advance directives  OnCall Visit No  Advance Directives (For Healthcare)  Would patient like information on creating a medical advance directive? Yes (Inpatient - patient requests chaplain consult to create a medical advance directive)   Chaplain responded to page to complete the patient's ACP documents. Chaplain scheduled notary and volunteers. Patient's documents were notarized, copied and scanned into the chart.   Arlyce Dice, Chaplain Resident [phone]

## 2022-11-25 DIAGNOSIS — I4891 Unspecified atrial fibrillation: Secondary | ICD-10-CM | POA: Diagnosis not present

## 2022-11-25 DIAGNOSIS — I10 Essential (primary) hypertension: Secondary | ICD-10-CM | POA: Diagnosis not present

## 2022-11-25 DIAGNOSIS — F419 Anxiety disorder, unspecified: Secondary | ICD-10-CM | POA: Diagnosis not present

## 2022-11-25 DIAGNOSIS — F32A Depression, unspecified: Secondary | ICD-10-CM | POA: Diagnosis not present

## 2022-11-25 LAB — CBC
HCT: 37.4 % (ref 36.0–46.0)
Hemoglobin: 11.7 g/dL — ABNORMAL LOW (ref 12.0–15.0)
MCH: 28.1 pg (ref 26.0–34.0)
MCHC: 31.3 g/dL (ref 30.0–36.0)
MCV: 89.7 fL (ref 80.0–100.0)
Platelets: 245 10*3/uL (ref 150–400)
RBC: 4.17 MIL/uL (ref 3.87–5.11)
RDW: 12.9 % (ref 11.5–15.5)
WBC: 10.7 10*3/uL — ABNORMAL HIGH (ref 4.0–10.5)
nRBC: 0 % (ref 0.0–0.2)

## 2022-11-25 LAB — BASIC METABOLIC PANEL
Anion gap: 9 (ref 5–15)
BUN: 8 mg/dL (ref 6–20)
CO2: 24 mmol/L (ref 22–32)
Calcium: 8 mg/dL — ABNORMAL LOW (ref 8.9–10.3)
Chloride: 105 mmol/L (ref 98–111)
Creatinine, Ser: 0.75 mg/dL (ref 0.44–1.00)
GFR, Estimated: >60 mL/min (ref >60)
Glucose, Bld: 116 mg/dL — ABNORMAL HIGH (ref 70–99)
Potassium: 3.6 mmol/L (ref 3.5–5.1)
Sodium: 138 mmol/L (ref 135–145)

## 2022-11-25 LAB — HEMOGLOBIN A1C
Hgb A1c MFr Bld: 6 % — ABNORMAL HIGH (ref 4.8–5.6)
Mean Plasma Glucose: 126 mg/dL

## 2022-11-25 MED ORDER — DILTIAZEM LOAD VIA INFUSION
10.0000 mg | Freq: Once | INTRAVENOUS | Status: AC
  Filled 2022-11-25: qty 10

## 2022-11-25 MED ORDER — DILTIAZEM HCL-DEXTROSE 125-5 MG/125ML-% IV SOLN (PREMIX)
INTRAVENOUS | Status: AC
  Administered 2022-11-25: 10 mg via INTRAVENOUS
  Filled 2022-11-25: qty 125

## 2022-11-25 MED ORDER — ROSUVASTATIN CALCIUM 10 MG PO TABS: 10.0000 mg | ORAL_TABLET | Freq: Every day | ORAL | 2 refills | Status: AC

## 2022-11-25 MED ORDER — DILTIAZEM HCL ER COATED BEADS 120 MG PO CP24: 120.0000 mg | ORAL_CAPSULE | Freq: Every day | ORAL | 2 refills | Status: DC

## 2022-11-25 MED ORDER — DILTIAZEM HCL-DEXTROSE 125-5 MG/125ML-% IV SOLN (PREMIX)
5.0000 mg/h | INTRAVENOUS | Status: DC
  Administered 2022-11-25: 5 mg/h via INTRAVENOUS

## 2022-11-25 MED ORDER — DILTIAZEM HCL ER COATED BEADS 120 MG PO CP24
120.0000 mg | ORAL_CAPSULE | Freq: Every day | ORAL | Status: DC
  Administered 2022-11-25: 120 mg via ORAL
  Filled 2022-11-25: qty 1

## 2022-11-25 NOTE — Progress Notes (Signed)
Patient back in NSR 84, VS obtained and MD notified.  Cardizem stopped.
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
## 2022-11-25 NOTE — Progress Notes (Signed)
Notified on call provider of patient going into afib rvr @ 140-158.  ECG completed, VS obtained and orders received for cardizem.

## 2022-11-25 NOTE — Progress Notes (Signed)
SATURATION QUALIFICATIONS: (This note is used to comply with regulatory documentation for home oxygen)  Patient Saturations on Room Air at Rest = 95%  Patient Saturations on Room Air while Ambulating = 93%  Patient Saturations on 0 Liters of oxygen while Ambulating = 93%

## 2022-11-25 NOTE — Progress Notes (Addendum)
Progress Note  Patient Name: Theresa Lucas Date of Encounter: 11/25/2022  Primary Cardiologist: None   Subjective   Patient seen and examined at her bedside.  Inpatient Medications    Scheduled Meds:  aspirin  81 mg Oral Daily   famotidine  40 mg Oral BID   FLUoxetine  40 mg Oral BID   Milnacipran  50 mg Oral BID   montelukast  10 mg Oral QPM   pantoprazole  40 mg Oral BID   predniSONE  5 mg Oral Q breakfast   pyridostigmine  60 mg Oral TID   rivaroxaban  20 mg Oral Q supper   rosuvastatin  20 mg Oral QHS   sodium chloride flush  3 mL Intravenous Q12H   Continuous Infusions:  sodium chloride     PRN Meds: sodium chloride, acetaminophen **OR** acetaminophen, busPIRone, morphine injection, ondansetron **OR** ondansetron (ZOFRAN) IV, oxyCODONE, prochlorperazine, sodium chloride flush, traZODone   Vital Signs    Vitals:   11/25/22 0515 11/25/22 0528 11/25/22 0600 11/25/22 0749  BP: 122/75 120/62 122/65 117/71  Pulse:    75  Resp: (!) 23 19 20 18   Temp:    98.8 F (37.1 C)  TempSrc:    Oral  SpO2:    92%  Weight:      Height:        Intake/Output Summary (Last 24 hours) at 11/25/2022 0937 Last data filed at 11/25/2022 0600 Gross per 24 hour  Intake 1010.7 ml  Output 600 ml  Net 410.7 ml   Filed Weights   11/23/22 1722 11/24/22 0420 11/25/22 0443  Weight: 92.5 kg 95.6 kg 96.3 kg    Telemetry    Sinus rhythm  - Personally Reviewed  ECG    None today  - Personally Reviewed  Physical Exam     General: Comfortable Head: Atraumatic, normal size  Eyes: PEERLA, EOMI  Neck: Supple, normal JVD Cardiac: Normal S1, S2; RRR; no murmurs, rubs, or gallops Lungs: Clear to auscultation bilaterally Abd: Soft, nontender, no hepatomegaly  Ext: warm, no edema Musculoskeletal: No deformities, BUE and BLE strength normal and equal Skin: Warm and dry, no rashes   Neuro: Alert and oriented to person, place, time, and situation, CNII-XII grossly intact, no  focal deficits  Psych: Normal mood and affect   Labs    Chemistry Recent Labs  Lab 11/23/22 0504 11/24/22 1712 11/25/22 0319  NA 139 139 138  K 3.9 4.0 3.6  CL 108 105 105  CO2 26 23 24   GLUCOSE 131* 106* 116*  BUN 20 8 8   CREATININE 0.79 0.77 0.75  CALCIUM 7.9* 8.2* 8.0*  PROT 6.0*  --   --   ALBUMIN 3.3*  --   --   AST 31  --   --   ALT 22  --   --   ALKPHOS 73  --   --   BILITOT 0.4  --   --   GFRNONAA >60 >60 >60  ANIONGAP 5 11 9      Hematology Recent Labs  Lab 11/23/22 0504 11/24/22 1712 11/25/22 0319  WBC 14.2* 10.6* 10.7*  RBC 4.71 4.36 4.17  HGB 13.6 12.3 11.7*  HCT 42.8 39.5 37.4  MCV 90.9 90.6 89.7  MCH 28.9 28.2 28.1  MCHC 31.8 31.1 31.3  RDW 13.1 12.9 12.9  PLT 364 249 245    Cardiac EnzymesNo results for input(s): "TROPONINI" in the last 168 hours. No results for input(s): "TROPIPOC" in the last 168  hours.   BNPNo results for input(s): "BNP", "PROBNP" in the last 168 hours.   DDimer No results for input(s): "DDIMER" in the last 168 hours.   Radiology    CARDIAC CATHETERIZATION  Result Date: 11/24/2022   Ost RCA to Prox RCA lesion is 10% stenosed.   Mid RCA to Dist RCA lesion is 20% stenosed.   Prox LAD to Mid LAD lesion is 20% stenosed. Mild non-obstructive CAD LVEDP 17 mmHg Recommendations: Medical management of mild CAD. Elevated troponin presumed to be demand ischemia in the setting of atrial fibrillation with RVR.   ECHOCARDIOGRAM COMPLETE  Result Date: 11/24/2022    ECHOCARDIOGRAM REPORT   Patient Name:   Theresa Lucas Date of Exam: 11/24/2022 Medical Rec #:  [id number]             Height:       64.0 in Accession #:    [id number]            Weight:       210.8 lb Date of Birth:  11-Jul-1960             BSA:          2.000 m Patient Age:    62 years              BP:           102/53 mmHg Patient Gender: F                     HR:           85 bpm. Exam Location:  Inpatient Procedure: 2D Echo, Cardiac Doppler and Color Doppler  Indications:    Afib  History:        Patient has no prior history of Echocardiogram examinations.                 Risk Factors:Hypertension.  Sonographer:    Melton Krebs RDCS, FE, PE Referring Phys: [id number] ASIA B ZIERLE-GHOSH IMPRESSIONS  1. Left ventricular ejection fraction, by estimation, is 55 to 60%. The left ventricle has normal function. Left ventricular endocardial border not optimally defined to evaluate regional wall motion, however regional wall motion abnormalities do appear present in the lateral wall. There is mild left ventricular hypertrophy. Left ventricular diastolic parameters were normal.  2. Right ventricular systolic function is normal. The right ventricular size is normal. There is normal pulmonary artery systolic pressure. The estimated right ventricular systolic pressure is 23.6 mmHg.  3. The mitral valve is normal in structure. No evidence of mitral valve regurgitation. No evidence of mitral stenosis.  4. The aortic valve is tricuspid. There is mild calcification of the aortic valve. Aortic valve regurgitation is not visualized. Aortic valve sclerosis is present, with no evidence of aortic valve stenosis.  5. The inferior vena cava is dilated in size with <50% respiratory variability, suggesting right atrial pressure of 15 mmHg. FINDINGS  Left Ventricle: Left ventricular ejection fraction, by estimation, is 55 to 60%. The left ventricle has normal function. Left ventricular endocardial border not optimally defined to evaluate regional wall motion. The left ventricular internal cavity size was normal in size. There is mild left ventricular hypertrophy. Left ventricular diastolic parameters were normal.  LV Wall Scoring: The mid and distal lateral wall and mid anterolateral segment are hypokinetic. Right Ventricle: The right ventricular size is normal. No increase in right ventricular wall thickness. Right ventricular systolic function is normal. There is normal pulmonary artery  systolic  pressure. The tricuspid regurgitant velocity is 1.47 m/s, and  with an assumed right atrial pressure of 15 mmHg, the estimated right ventricular systolic pressure is 23.6 mmHg. Left Atrium: Left atrial size was normal in size. Right Atrium: Right atrial size was normal in size. Pericardium: There is no evidence of pericardial effusion. Mitral Valve: The mitral valve is normal in structure. No evidence of mitral valve regurgitation. No evidence of mitral valve stenosis. Tricuspid Valve: The tricuspid valve is normal in structure. Tricuspid valve regurgitation is trivial. No evidence of tricuspid stenosis. Aortic Valve: The aortic valve is tricuspid. There is mild calcification of the aortic valve. Aortic valve regurgitation is not visualized. Aortic valve sclerosis is present, with no evidence of aortic valve stenosis. Aortic valve mean gradient measures 4.0 mmHg. Aortic valve peak gradient measures 7.2 mmHg. Aortic valve area, by VTI measures 3.49 cm. Pulmonic Valve: The pulmonic valve was normal in structure. Pulmonic valve regurgitation is not visualized. No evidence of pulmonic stenosis. Aorta: The aortic root is normal in size and structure. Venous: The inferior vena cava is dilated in size with less than 50% respiratory variability, suggesting right atrial pressure of 15 mmHg. IAS/Shunts: No atrial level shunt detected by color flow Doppler.  LEFT VENTRICLE PLAX 2D LVIDd:         4.60 cm   Diastology LVIDs:         3.00 cm   LV e' medial:    6.31 cm/s LV PW:         1.20 cm   LV E/e' medial:  12.4 LV IVS:        1.00 cm   LV e' lateral:   6.20 cm/s LVOT diam:     2.30 cm   LV E/e' lateral: 12.6 LV SV:         86 LV SV Index:   43 LVOT Area:     4.15 cm  RIGHT VENTRICLE RV S prime:     13.90 cm/s TAPSE (M-mode): 2.0 cm LEFT ATRIUM           Index        RIGHT ATRIUM           Index LA diam:      4.40 cm 2.20 cm/m   RA Area:     11.70 cm LA Vol (A2C): 68.6 ml 34.29 ml/m  RA Volume:   25.90 ml  12.95 ml/m LA  Vol (A4C): 76.3 ml 38.14 ml/m  AORTIC VALVE AV Area (Vmax):    3.26 cm AV Area (Vmean):   2.80 cm AV Area (VTI):     3.49 cm AV Vmax:           134.00 cm/s AV Vmean:          98.900 cm/s AV VTI:            0.245 m AV Peak Grad:      7.2 mmHg AV Mean Grad:      4.0 mmHg LVOT Vmax:         105.00 cm/s LVOT Vmean:        66.600 cm/s LVOT VTI:          0.206 m LVOT/AV VTI ratio: 0.84  AORTA Ao Root diam: 3.00 cm Ao Asc diam:  3.20 cm MITRAL VALVE               TRICUSPID VALVE MV Area (PHT): 3.72 cm    TR Peak grad:   8.6  mmHg MV Decel Time: 204 msec    TR Vmax:        147.00 cm/s MV E velocity: 78.40 cm/s MV A velocity: 63.00 cm/s  SHUNTS MV E/A ratio:  1.24        Systemic VTI:  0.21 m                            Systemic Diam: 2.30 cm Weston Brass MD Electronically signed by Weston Brass MD Signature Date/Time: 11/24/2022/11:36:33 AM    Final     Cardiac Studies   Reviewed by heart catheterization echocardiogram  Patient Profile     62 y.o. female with history of hypertension, hyperlipidemia presented with NSTEMI and was in A-fib RVR.  Assessment & Plan    Mild obstructive CAD Recent NSTEMI New onset paroxysmal atrial fibrillation status post DCCV now in sinus rhythm Hypertension May Turner syndrome Myasthenia gravis Hyperlipidemia   Status post left heart catheterization yesterday mild CAD no indication for any PCI.  Medical management at this time.    Paroxysmal A-fib now she is in sinus rhythm.  She had been placed on Xarelto for stroke prevention, she also does have May Turner syndrome and is on lifelong Xarelto anticoagulation.  Despite CHA2DS2-VASc score she will require to be on her anticoagulation for her DVT.  Will stop aspirin decrease risk of bleeding-continue Xarelto.  Start Cardizem 120 mg daily   Continue Crestor 20 mg daily.  Clinically does not appear to be fluid overloaded.  She is on oxygen this morning will defer to the primary team as do not believe that she  is any heart failure at this time that could be leading to her respiratory failure.  [facility] will sign off.   Medication Recommendations: Cardizem 120 mg a day, Crestor 20 mg daily, Xarelto 20 mg daily Other recommendations (labs, testing, etc):  none  Follow up as an outpatient:  routine      Time Spent Directly with Patient:   I have spent a total of 40 with the patient reviewing notes, imaging, EKGs, labs and examining the patient as well as establishing an assessment and plan that was discussed personally with the patient.  > 50% of time was spent in direct patient care.   For questions or updates, please contact [facility] Please consult [email or web address] for contact info under Cardiology/STEMI.      Signed, Thomasene Ripple, DO  11/25/2022, 9:37 AM

## 2022-11-25 NOTE — Plan of Care (Signed)
  Problem: Education: Goal: Knowledge of disease or condition will improve Outcome: Progressing Goal: Understanding of medication regimen will improve Outcome: Progressing Goal: Individualized Educational Video(s) Outcome: Progressing   Problem: Activity: Goal: Ability to tolerate increased activity will improve Outcome: Progressing   Problem: Cardiac: Goal: Ability to achieve and maintain adequate cardiopulmonary perfusion will improve Outcome: Progressing   Problem: Health Behavior/Discharge Planning: Goal: Ability to safely manage health-related needs after discharge will improve Outcome: Progressing   Problem: Education: Goal: Understanding of CV disease, CV risk reduction, and recovery process will improve Outcome: Progressing Goal: Individualized Educational Video(s) Outcome: Progressing   Problem: Activity: Goal: Ability to return to baseline activity level will improve Outcome: Progressing   Problem: Cardiovascular: Goal: Ability to achieve and maintain adequate cardiovascular perfusion will improve Outcome: Progressing Goal: Vascular access site(s) Level 0-1 will be maintained Outcome: Progressing   Problem: Health Behavior/Discharge Planning: Goal: Ability to safely manage health-related needs after discharge will improve Outcome: Progressing   Problem: Education: Goal: Knowledge of General Education information will improve Description: Including pain rating scale, medication(s)/side effects and non-pharmacologic comfort measures Outcome: Progressing   Problem: Health Behavior/Discharge Planning: Goal: Ability to manage health-related needs will improve Outcome: Progressing   Problem: Clinical Measurements: Goal: Ability to maintain clinical measurements within normal limits will improve Outcome: Progressing Goal: Will remain free from infection Outcome: Progressing Goal: Diagnostic test results will improve Outcome: Progressing Goal: Respiratory  complications will improve Outcome: Progressing Goal: Cardiovascular complication will be avoided Outcome: Progressing   Problem: Activity: Goal: Risk for activity intolerance will decrease Outcome: Progressing   Problem: Nutrition: Goal: Adequate nutrition will be maintained Outcome: Progressing   Problem: Coping: Goal: Level of anxiety will decrease Outcome: Progressing   Problem: Elimination: Goal: Will not experience complications related to bowel motility Outcome: Progressing Goal: Will not experience complications related to urinary retention Outcome: Progressing   Problem: Pain Managment: Goal: General experience of comfort will improve Outcome: Progressing   Problem: Safety: Goal: Ability to remain free from injury will improve Outcome: Progressing   Problem: Skin Integrity: Goal: Risk for impaired skin integrity will decrease Outcome: Progressing

## 2022-11-25 NOTE — Significant Event (Addendum)
Pt into A.Fib RVR with rates 140-150s.  BP okay (130s systolic); however, H/o Myasthenia gravis so dont want to do BB.  Will do cardizem gtt per protocol instead.  Update: converted back to NSR rather quickly after cardizem bolus and gtt started.  Cardizem gtt being stopped at this time.  Will defer to cards if they want to start anything PO this morning or not.

## 2022-11-27 ENCOUNTER — Encounter (HOSPITAL_COMMUNITY): Payer: Self-pay | Admitting: Cardiovascular Disease

## 2022-11-27 MED FILL — Verapamil HCl IV Soln 2.5 MG/ML: INTRAVENOUS | Qty: 2 | Status: AC

## 2022-11-30 ENCOUNTER — Ambulatory Visit (INDEPENDENT_AMBULATORY_CARE_PROVIDER_SITE_OTHER): Payer: Medicare HMO | Admitting: Neurology

## 2022-11-30 ENCOUNTER — Encounter: Payer: Self-pay | Admitting: Neurology

## 2022-11-30 VITALS — BP 136/80 | HR 84 | Ht 64.0 in | Wt 208.0 lb

## 2022-11-30 DIAGNOSIS — H02402 Unspecified ptosis of left eyelid: Secondary | ICD-10-CM

## 2022-11-30 DIAGNOSIS — R0600 Dyspnea, unspecified: Secondary | ICD-10-CM | POA: Diagnosis not present

## 2022-11-30 DIAGNOSIS — M797 Fibromyalgia: Secondary | ICD-10-CM | POA: Diagnosis not present

## 2022-11-30 DIAGNOSIS — G7000 Myasthenia gravis without (acute) exacerbation: Secondary | ICD-10-CM | POA: Diagnosis not present

## 2022-11-30 DIAGNOSIS — H532 Diplopia: Secondary | ICD-10-CM

## 2022-11-30 NOTE — Patient Instructions (Signed)
When you finish the 50 mg of prednisone for bronchitis, go back to 5 mg of prednisone daily for your myasthenia gravis. We will not make any changes to medications today. If you notice increase in blurry/double vision or drooping eyelids, let me know.  You can continue Mestinon 60 mg as needed (up to three times daily).  If you have difficulty swallowing, breathing, or extreme weakness, please call 911 and go to the nearest ED as this can be signs of a myasthenic crisis.  I will see you back in 3 months. Please let me know if you have any questions or concerns in the meantime.   The physicians and staff at [facility] are committed to providing excellent care. You may receive a survey requesting feedback about your experience at our office. We strive to receive "very good" responses to the survey questions. If you feel that your experience would prevent you from giving the office a "very good " response, please contact our office to try to remedy the situation. We may be reached at [phone]. Thank you for taking the time out of your busy day to complete the survey.  Jacquelyne Balint, MD [facility]

## 2022-12-06 ENCOUNTER — Ambulatory Visit: Payer: Medicare HMO | Attending: Physician Assistant | Admitting: Physical Therapy

## 2022-12-06 ENCOUNTER — Encounter: Payer: Self-pay | Admitting: Physical Therapy

## 2022-12-06 DIAGNOSIS — R293 Abnormal posture: Secondary | ICD-10-CM | POA: Insufficient documentation

## 2022-12-06 DIAGNOSIS — M5459 Other low back pain: Secondary | ICD-10-CM | POA: Insufficient documentation
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
## 2022-12-12 ENCOUNTER — Ambulatory Visit: Payer: Medicare HMO | Admitting: *Deleted

## 2022-12-13 ENCOUNTER — Ambulatory Visit (INDEPENDENT_AMBULATORY_CARE_PROVIDER_SITE_OTHER): Payer: Medicare HMO | Admitting: Otolaryngology

## 2022-12-14 ENCOUNTER — Encounter: Payer: Medicare HMO | Admitting: *Deleted

## 2022-12-19 ENCOUNTER — Ambulatory Visit: Payer: Medicare HMO | Attending: Physician Assistant | Admitting: *Deleted

## 2022-12-19 ENCOUNTER — Encounter: Payer: Self-pay | Admitting: *Deleted

## 2022-12-19 DIAGNOSIS — M5459 Other low back pain: Secondary | ICD-10-CM | POA: Diagnosis not present

## 2022-12-19 DIAGNOSIS — M6281 Muscle weakness (generalized): Secondary | ICD-10-CM | POA: Insufficient documentation

## 2022-12-19 DIAGNOSIS — R293 Abnormal posture: Secondary | ICD-10-CM | POA: Diagnosis not present

## 2022-12-19 DIAGNOSIS — R279 Unspecified lack of coordination: Secondary | ICD-10-CM | POA: Insufficient documentation
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
## 2022-12-20 DIAGNOSIS — N952 Postmenopausal atrophic vaginitis: Secondary | ICD-10-CM | POA: Diagnosis not present

## 2022-12-20 DIAGNOSIS — Z01419 Encounter for gynecological examination (general) (routine) without abnormal findings: Secondary | ICD-10-CM | POA: Diagnosis not present

## 2022-12-20 DIAGNOSIS — N3941 Urge incontinence: Secondary | ICD-10-CM | POA: Diagnosis not present

## 2022-12-21 ENCOUNTER — Ambulatory Visit: Payer: Medicare HMO | Admitting: Physical Therapy

## 2022-12-21 DIAGNOSIS — R293 Abnormal posture: Secondary | ICD-10-CM | POA: Diagnosis not present

## 2022-12-21 DIAGNOSIS — R279 Unspecified lack of coordination: Secondary | ICD-10-CM | POA: Diagnosis not present

## 2022-12-21 DIAGNOSIS — M5459 Other low back pain: Secondary | ICD-10-CM | POA: Diagnosis not present

## 2022-12-21 DIAGNOSIS — M6281 Muscle weakness (generalized): Secondary | ICD-10-CM | POA: Diagnosis not present
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
## 2022-12-25 ENCOUNTER — Ambulatory Visit: Payer: Medicare HMO | Attending: Cardiology | Admitting: Cardiology

## 2022-12-25 ENCOUNTER — Ambulatory Visit: Payer: Medicare HMO | Attending: Cardiology

## 2022-12-25 ENCOUNTER — Encounter: Payer: Self-pay | Admitting: Cardiology

## 2022-12-25 VITALS — BP 134/86 | HR 68 | Ht 64.0 in | Wt 207.0 lb

## 2022-12-25 DIAGNOSIS — I4891 Unspecified atrial fibrillation: Secondary | ICD-10-CM

## 2022-12-25 DIAGNOSIS — I480 Paroxysmal atrial fibrillation: Secondary | ICD-10-CM | POA: Diagnosis not present

## 2022-12-25 DIAGNOSIS — I2510 Atherosclerotic heart disease of native coronary artery without angina pectoris: Secondary | ICD-10-CM

## 2022-12-25 DIAGNOSIS — E7849 Other hyperlipidemia: Secondary | ICD-10-CM | POA: Diagnosis not present

## 2022-12-25 DIAGNOSIS — I10 Essential (primary) hypertension: Secondary | ICD-10-CM | POA: Diagnosis not present

## 2022-12-25 DIAGNOSIS — J45901 Unspecified asthma with (acute) exacerbation: Secondary | ICD-10-CM | POA: Diagnosis not present

## 2022-12-25 DIAGNOSIS — I214 Non-ST elevation (NSTEMI) myocardial infarction: Secondary | ICD-10-CM | POA: Diagnosis not present

## 2022-12-25 DIAGNOSIS — E041 Nontoxic single thyroid nodule: Secondary | ICD-10-CM | POA: Diagnosis not present

## 2022-12-25 DIAGNOSIS — Z1321 Encounter for screening for nutritional disorder: Secondary | ICD-10-CM | POA: Diagnosis not present

## 2022-12-25 DIAGNOSIS — Z86711 Personal history of pulmonary embolism: Secondary | ICD-10-CM | POA: Diagnosis not present

## 2022-12-25 DIAGNOSIS — I871 Compression of vein: Secondary | ICD-10-CM | POA: Diagnosis not present

## 2022-12-25 DIAGNOSIS — G7000 Myasthenia gravis without (acute) exacerbation: Secondary | ICD-10-CM | POA: Diagnosis not present

## 2022-12-25 DIAGNOSIS — R7301 Impaired fasting glucose: Secondary | ICD-10-CM | POA: Diagnosis not present

## 2022-12-25 DIAGNOSIS — M797 Fibromyalgia: Secondary | ICD-10-CM | POA: Diagnosis not present

## 2022-12-25 MED ORDER — DILTIAZEM HCL ER COATED BEADS 180 MG PO CP24
180.0000 mg | ORAL_CAPSULE | Freq: Every day | ORAL | 3 refills | Status: DC
Start: 2022-12-25 — End: 2023-02-12

## 2022-12-25 NOTE — Progress Notes (Unsigned)
Enrolled patient for a 14 day [employer] XT  monitor to be mailed to patients home  °
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
## 2022-12-25 NOTE — Patient Instructions (Addendum)
Medication Instructions:  Increase Cardizem 180mg  every day Continue all current medications *If you need a refill on your cardiac medications before your next appointment, please call your pharmacy*   Lab Work: none If you have labs (blood work) drawn today and your tests are completely normal, you will receive your results only by: MyChart Message (if you have MyChart) OR A paper copy in the mail If you have any lab test that is abnormal or we need to change your treatment, we will call you to review the results.   Testing/Procedures:   Christena Deem- Long Term Monitor Instructions  Your physician has requested you wear a ZIO patch monitor for 14 days.  This is a single patch monitor. [employer] supplies one patch monitor per enrollment. Additional stickers are not available. Please do not apply patch if you will be having a Nuclear Stress Test,  Echocardiogram, Cardiac CT, MRI, or Chest Xray during the period you would be wearing the  monitor. The patch cannot be worn during these tests. You cannot remove and re-apply the  ZIO XT patch monitor.  Your ZIO patch monitor will be mailed 3 day USPS to your address on file. It may take 3-5 days  to receive your monitor after you have been enrolled.  Once you have received your monitor, please review the enclosed instructions. Your monitor  has already been registered assigning a specific monitor serial # to you.  Billing and Patient Assistance Program Information  We have supplied [employer] with any of your insurance information on file for billing purposes. [employer] offers a sliding scale Patient Assistance Program for patients that do not have  insurance, or whose insurance does not completely cover the cost of the ZIO monitor.  You must apply for the Patient Assistance Program to qualify for this discounted rate.  To apply, please call [employer] at [phone], select option 4, select option 2, ask to apply for  Patient Assistance Program.  Meredeth Ide will ask your household income, and how many people  are in your household. They will quote your out-of-pocket cost based on that information.  [employer] will also be able to set up a 52-month, interest-free payment plan if needed.  Applying the monitor   Shave hair from upper left chest.  Hold abrader disc by orange tab. Rub abrader in 40 strokes over the upper left chest as  indicated in your monitor instructions.  Clean area with 4 enclosed alcohol pads. Let dry.  Apply patch as indicated in monitor instructions. Patch will be placed under collarbone on left  side of chest with arrow pointing upward.  Rub patch adhesive wings for 2 minutes. Remove white label marked "1". Remove the white  label marked "2". Rub patch adhesive wings for 2 additional minutes.  While looking in a mirror, press and release button in center of patch. A small green light will  flash 3-4 times. This will be your only indicator that the monitor has been turned on.  Do not shower for the first 24 hours. You may shower after the first 24 hours.  Press the button if you feel a symptom. You will hear a small click. Record Date, Time and  Symptom in the Patient Logbook.  When you are ready to remove the patch, follow instructions on the last 2 pages of Patient  Logbook. Stick patch monitor onto the last page of Patient Logbook.  Place Patient Logbook in the blue and white box. Use locking tab on box and tape box  closed  securely. The blue and white box has prepaid postage on it. Please place it in the mailbox as  soon as possible. Your physician should have your test results approximately 7 days after the  monitor has been mailed back to [facility].  Call [facility] Customer Care at [phone] if you have questions regarding  your ZIO XT patch monitor. Call them immediately if you see an orange light blinking on your  monitor.  If your monitor falls off in less than 4 days, contact our Monitor  department at [phone].  If your monitor becomes loose or falls off after 4 days call [employer] at [phone] for  suggestions on securing your monitor    Follow-Up: At [facility], you and your health needs are our priority.  As part of our continuing mission to provide you with exceptional heart care, we have created designated Provider Care Teams.  These Care Teams include your primary Cardiologist (physician) and Advanced Practice Providers (APPs -  Physician Assistants and Nurse Practitioners) who all work together to provide you with the care you need, when you need it.  We recommend signing up for the patient portal called "MyChart".  Sign up information is provided on this After Visit Summary.  MyChart is used to connect with patients for Virtual Visits (Telemedicine).  Patients are able to view lab/test results, encounter notes, upcoming appointments, etc.  Non-urgent messages can be sent to your provider as well.   To learn more about what you can do with MyChart, go to [email or web address].    Your next appointment:   4 month(s)  Provider:   Thomasene Ripple, DO

## 2022-12-26 ENCOUNTER — Ambulatory Visit: Payer: Medicare HMO

## 2022-12-26 DIAGNOSIS — M6281 Muscle weakness (generalized): Secondary | ICD-10-CM | POA: Diagnosis not present

## 2022-12-26 DIAGNOSIS — R293 Abnormal posture: Secondary | ICD-10-CM | POA: Diagnosis not present

## 2022-12-26 DIAGNOSIS — R279 Unspecified lack of coordination: Secondary | ICD-10-CM | POA: Diagnosis not present

## 2022-12-26 DIAGNOSIS — M5459 Other low back pain: Secondary | ICD-10-CM | POA: Diagnosis not present
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
## 2022-12-28 ENCOUNTER — Encounter: Payer: Self-pay | Admitting: *Deleted

## 2022-12-28 ENCOUNTER — Ambulatory Visit: Payer: Medicare HMO | Admitting: *Deleted

## 2022-12-28 DIAGNOSIS — R293 Abnormal posture: Secondary | ICD-10-CM

## 2022-12-28 DIAGNOSIS — M5459 Other low back pain: Secondary | ICD-10-CM

## 2022-12-28 DIAGNOSIS — M6281 Muscle weakness (generalized): Secondary | ICD-10-CM

## 2022-12-28 DIAGNOSIS — R279 Unspecified lack of coordination: Secondary | ICD-10-CM | POA: Diagnosis not present
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
## 2022-12-29 DIAGNOSIS — I480 Paroxysmal atrial fibrillation: Secondary | ICD-10-CM | POA: Diagnosis not present

## 2023-01-01 DIAGNOSIS — Z23 Encounter for immunization: Secondary | ICD-10-CM | POA: Diagnosis not present

## 2023-01-01 DIAGNOSIS — E041 Nontoxic single thyroid nodule: Secondary | ICD-10-CM | POA: Diagnosis not present

## 2023-01-01 DIAGNOSIS — I10 Essential (primary) hypertension: Secondary | ICD-10-CM | POA: Diagnosis not present

## 2023-01-01 DIAGNOSIS — I700 Atherosclerosis of aorta: Secondary | ICD-10-CM | POA: Diagnosis not present

## 2023-01-01 DIAGNOSIS — R7301 Impaired fasting glucose: Secondary | ICD-10-CM | POA: Diagnosis not present

## 2023-01-01 DIAGNOSIS — G7000 Myasthenia gravis without (acute) exacerbation: Secondary | ICD-10-CM | POA: Diagnosis not present

## 2023-01-01 DIAGNOSIS — Z0001 Encounter for general adult medical examination with abnormal findings: Secondary | ICD-10-CM | POA: Diagnosis not present

## 2023-01-01 DIAGNOSIS — J45909 Unspecified asthma, uncomplicated: Secondary | ICD-10-CM | POA: Diagnosis not present

## 2023-01-01 DIAGNOSIS — E7849 Other hyperlipidemia: Secondary | ICD-10-CM | POA: Diagnosis not present

## 2023-01-03 ENCOUNTER — Ambulatory Visit: Payer: Medicare HMO | Admitting: Physical Therapy

## 2023-01-03 DIAGNOSIS — M6281 Muscle weakness (generalized): Secondary | ICD-10-CM

## 2023-01-03 DIAGNOSIS — R293 Abnormal posture: Secondary | ICD-10-CM | POA: Diagnosis not present

## 2023-01-03 DIAGNOSIS — M5459 Other low back pain: Secondary | ICD-10-CM | POA: Diagnosis not present

## 2023-01-03 DIAGNOSIS — R279 Unspecified lack of coordination: Secondary | ICD-10-CM | POA: Diagnosis not present
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
## 2023-01-04 ENCOUNTER — Encounter: Payer: Medicare HMO | Admitting: Physical Therapy

## 2023-01-05 ENCOUNTER — Encounter (INDEPENDENT_AMBULATORY_CARE_PROVIDER_SITE_OTHER): Payer: Self-pay

## 2023-01-05 ENCOUNTER — Ambulatory Visit (INDEPENDENT_AMBULATORY_CARE_PROVIDER_SITE_OTHER): Payer: Medicare HMO

## 2023-01-05 VITALS — Ht 64.0 in | Wt 207.0 lb

## 2023-01-05 DIAGNOSIS — J322 Chronic ethmoidal sinusitis: Secondary | ICD-10-CM

## 2023-01-05 DIAGNOSIS — J310 Chronic rhinitis: Secondary | ICD-10-CM | POA: Diagnosis not present

## 2023-01-05 DIAGNOSIS — H608X3 Other otitis externa, bilateral: Secondary | ICD-10-CM

## 2023-01-05 DIAGNOSIS — J320 Chronic maxillary sinusitis: Secondary | ICD-10-CM

## 2023-01-09 DIAGNOSIS — J320 Chronic maxillary sinusitis: Secondary | ICD-10-CM | POA: Insufficient documentation

## 2023-01-09 DIAGNOSIS — H608X3 Other otitis externa, bilateral: Secondary | ICD-10-CM | POA: Insufficient documentation

## 2023-01-09 DIAGNOSIS — J322 Chronic ethmoidal sinusitis: Secondary | ICD-10-CM | POA: Insufficient documentation

## 2023-01-09 NOTE — Progress Notes (Signed)
Patient ID: Theresa Lucas, female   DOB: [birth year], 62 y.o.   MRN: [id number]  Follow-up: Chronic eczematous otitis externa, chronic maxillary and ethmoid sinusitis  HPI: The patient is a 62 year old female who returns today for her follow-up evaluation.  She has a history of right-sided chronic maxillary and ethmoid sinusitis.  She underwent endoscopic sinus surgery in 2018.  She also has a history of chronic eczematous otitis externa.  She was previously treated with Elocon cream.  The patient returns today reporting occasional nasal congestion.  She has not had any significant sinus infections since her last visit 6 months ago.  She continues to have intermittent itchy sensation in her ears.  The Elocon cream has helped.  She would like a refill of the Elocon.  Exam: General: Communicates without difficulty, well nourished, no acute distress. Head: Normocephalic, no evidence injury, no tenderness, facial buttresses intact without stepoff. Face/sinus: No tenderness to palpation and percussion. Facial movement is normal and symmetric. Eyes: PERRL, EOMI. No scleral icterus, conjunctivae clear. Neuro: CN II exam reveals vision grossly intact.  No nystagmus at any point of gaze. Ears: Auricles well formed without lesions.  Ear canals are intact with eczematous changes.  No erythema or edema is appreciated.  The TMs are intact without fluid. Nose: External evaluation reveals normal support and skin without lesions.  Dorsum is intact.  Anterior rhinoscopy reveals congested mucosa over anterior aspect of inferior turbinates and intact septum.  No purulence noted. Oral:  Oral cavity and oropharynx are intact, symmetric, without erythema or edema.  Mucosa is moist without lesions. Neck: Full range of motion without pain.  There is no significant lymphadenopathy.  No masses palpable.  Thyroid bed within normal limits to palpation.  Parotid glands and submandibular glands equal bilaterally without mass.   Trachea is midline. Neuro:  CN 2-12 grossly intact.    Procedure:  Flexible Nasal Endoscopy: Description: Risks, benefits, and alternatives of flexible endoscopy were explained to the patient.  Specific mention was made of the risk of throat numbness with difficulty swallowing, possible bleeding from the nose and mouth, and pain from the procedure.  The patient gave oral consent to proceed.  The flexible scope was inserted into the right nasal cavity. Endoscopy of the nasal cavity, superior, inferior, and middle meatus was performed. Mildly edematous mucosa was noted. No polyp, mass, or lesion was appreciated. The sinus openings were widely patent. Olfactory cleft was clear. Nasopharynx was clear. Turbinates were well healed. The procedure was repeated on the contralateral side with similar findings. The patient tolerated the procedure well.   Assessment: 1.  Bilateral chronic eczematous otitis externa.  Her tympanic membranes and middle ear spaces are normal. 2.  Mild chronic rhinitis with nasal mucosal congestion.  3. The patient's right maxillary and ethmoid sinuses are widely patent, secondary to her previous surgery.  4. There is no evidence of infection or polyposis.   Plan: 1.  The physical exam and nasal endoscopy findings are reviewed with the patient. 2.  Continue the use of Elocon cream to treat the chronic eczematous otitis externa.  A refill was given to the patient. 3.  Continue with her allergy treatment regimen of Xyzal, Flonase, and Astepro. 4.  The patient will return for reevaluation in 1 year, sooner if needed.

## 2023-01-10 ENCOUNTER — Ambulatory Visit: Payer: Medicare HMO

## 2023-01-10 DIAGNOSIS — K219 Gastro-esophageal reflux disease without esophagitis: Secondary | ICD-10-CM | POA: Diagnosis not present

## 2023-01-10 DIAGNOSIS — M6281 Muscle weakness (generalized): Secondary | ICD-10-CM | POA: Diagnosis not present

## 2023-01-10 DIAGNOSIS — M5459 Other low back pain: Secondary | ICD-10-CM

## 2023-01-10 DIAGNOSIS — J4540 Moderate persistent asthma, uncomplicated: Secondary | ICD-10-CM | POA: Diagnosis not present

## 2023-01-10 DIAGNOSIS — R279 Unspecified lack of coordination: Secondary | ICD-10-CM

## 2023-01-10 DIAGNOSIS — R293 Abnormal posture: Secondary | ICD-10-CM | POA: Diagnosis not present

## 2023-01-10 DIAGNOSIS — H1045 Other chronic allergic conjunctivitis: Secondary | ICD-10-CM | POA: Diagnosis not present

## 2023-01-10 DIAGNOSIS — J300 Vasomotor rhinitis: Secondary | ICD-10-CM | POA: Diagnosis not present
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
## 2023-01-11 ENCOUNTER — Encounter: Payer: Self-pay | Admitting: *Deleted

## 2023-01-11 ENCOUNTER — Ambulatory Visit: Payer: Medicare HMO | Admitting: *Deleted

## 2023-01-11 DIAGNOSIS — M6281 Muscle weakness (generalized): Secondary | ICD-10-CM | POA: Diagnosis not present

## 2023-01-11 DIAGNOSIS — M5459 Other low back pain: Secondary | ICD-10-CM | POA: Diagnosis not present

## 2023-01-11 DIAGNOSIS — R293 Abnormal posture: Secondary | ICD-10-CM | POA: Diagnosis not present

## 2023-01-11 DIAGNOSIS — R279 Unspecified lack of coordination: Secondary | ICD-10-CM | POA: Diagnosis not present
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
## 2023-01-17 DIAGNOSIS — M5416 Radiculopathy, lumbar region: Secondary | ICD-10-CM | POA: Diagnosis not present

## 2023-01-17 DIAGNOSIS — M48061 Spinal stenosis, lumbar region without neurogenic claudication: Secondary | ICD-10-CM | POA: Diagnosis not present

## 2023-01-17 DIAGNOSIS — M47816 Spondylosis without myelopathy or radiculopathy, lumbar region: Secondary | ICD-10-CM | POA: Diagnosis not present

## 2023-01-17 DIAGNOSIS — Z79891 Long term (current) use of opiate analgesic: Secondary | ICD-10-CM | POA: Diagnosis not present

## 2023-01-17 DIAGNOSIS — G894 Chronic pain syndrome: Secondary | ICD-10-CM | POA: Diagnosis not present

## 2023-01-24 ENCOUNTER — Telehealth: Payer: Self-pay | Admitting: Neurology

## 2023-01-24 DIAGNOSIS — G7000 Myasthenia gravis without (acute) exacerbation: Secondary | ICD-10-CM

## 2023-01-27 DIAGNOSIS — I480 Paroxysmal atrial fibrillation: Secondary | ICD-10-CM | POA: Diagnosis not present

## 2023-01-30 NOTE — Telephone Encounter (Signed)
1. Which medications need refilled? (List name and dosage, if known) Pyridostigmine  2. Which pharmacy/location is medication to be sent to? (include street and city if Insurance claims handler) [facility]
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
## 2023-01-31 ENCOUNTER — Other Ambulatory Visit: Payer: Self-pay

## 2023-02-01 ENCOUNTER — Other Ambulatory Visit: Payer: Self-pay

## 2023-02-01 DIAGNOSIS — G7000 Myasthenia gravis without (acute) exacerbation: Secondary | ICD-10-CM

## 2023-02-01 MED ORDER — PYRIDOSTIGMINE BROMIDE 60 MG PO TABS: 60.0000 mg | ORAL_TABLET | Freq: Three times a day (TID) | ORAL | 3 refills | Status: DC | PRN

## 2023-02-01 NOTE — Telephone Encounter (Signed)
I resent prescription and call pharmacy and they are filling it for her. I called patient and made her aware. She understood.

## 2023-02-05 ENCOUNTER — Telehealth: Payer: Self-pay

## 2023-02-05 NOTE — Telephone Encounter (Signed)
Called pt to let her know Dr. Servando Salina wants to set up a MyChart visit with her. No answer, left message for her to return the call.

## 2023-02-06 NOTE — Telephone Encounter (Signed)
Spoke with pt   Appt made

## 2023-02-06 NOTE — Telephone Encounter (Signed)
Follow Up:      Patient is returning Jasmine's call from yesterday.

## 2023-02-07 ENCOUNTER — Telehealth: Payer: Medicare HMO | Admitting: Neurology

## 2023-02-07 ENCOUNTER — Encounter: Payer: Self-pay | Admitting: Neurology

## 2023-02-07 VITALS — Ht 64.0 in | Wt 210.0 lb

## 2023-02-07 DIAGNOSIS — H532 Diplopia: Secondary | ICD-10-CM

## 2023-02-07 DIAGNOSIS — R471 Dysarthria and anarthria: Secondary | ICD-10-CM | POA: Diagnosis not present

## 2023-02-07 DIAGNOSIS — M797 Fibromyalgia: Secondary | ICD-10-CM

## 2023-02-07 DIAGNOSIS — G7000 Myasthenia gravis without (acute) exacerbation: Secondary | ICD-10-CM | POA: Diagnosis not present

## 2023-02-12 ENCOUNTER — Encounter: Payer: Self-pay | Admitting: Cardiology

## 2023-02-12 ENCOUNTER — Ambulatory Visit: Payer: Medicare HMO | Attending: Cardiology | Admitting: Cardiology

## 2023-02-12 VITALS — BP 130/82 | HR 72 | Wt 210.0 lb

## 2023-02-12 DIAGNOSIS — I10 Essential (primary) hypertension: Secondary | ICD-10-CM

## 2023-02-12 DIAGNOSIS — I2510 Atherosclerotic heart disease of native coronary artery without angina pectoris: Secondary | ICD-10-CM

## 2023-02-12 DIAGNOSIS — I480 Paroxysmal atrial fibrillation: Secondary | ICD-10-CM | POA: Diagnosis not present

## 2023-02-12 DIAGNOSIS — E782 Mixed hyperlipidemia: Secondary | ICD-10-CM | POA: Diagnosis not present

## 2023-02-12 MED ORDER — DILTIAZEM HCL ER COATED BEADS 240 MG PO CP24: 240.0000 mg | ORAL_CAPSULE | Freq: Every day | ORAL | 3 refills | Status: DC

## 2023-02-12 NOTE — Patient Instructions (Signed)
Medication Instructions:  Your physician has recommended you make the following change in your medication:  INCREASE: Cardizem 240 mg once daily *If you need a refill on your cardiac medications before your next appointment, please call your pharmacy*   Follow-Up: At [facility], you and your health needs are our priority.  As part of our continuing mission to provide you with exceptional heart care, we have created designated Provider Care Teams.  These Care Teams include your primary Cardiologist (physician) and Advanced Practice Providers (APPs -  Physician Assistants and Nurse Practitioners) who all work together to provide you with the care you need, when you need it.  Your next appointment:    Keep upcoming appt  Provider:   Thomasene Ripple, DO

## 2023-02-12 NOTE — Addendum Note (Signed)
Addended by: Reynolds Bowl on: 02/12/2023 09:15 AM   Modules accepted: Orders
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
## 2023-02-13 DIAGNOSIS — F32A Depression, unspecified: Secondary | ICD-10-CM | POA: Diagnosis not present

## 2023-02-13 DIAGNOSIS — G8929 Other chronic pain: Secondary | ICD-10-CM | POA: Diagnosis not present

## 2023-02-13 DIAGNOSIS — M48061 Spinal stenosis, lumbar region without neurogenic claudication: Secondary | ICD-10-CM | POA: Diagnosis not present

## 2023-02-13 DIAGNOSIS — M5116 Intervertebral disc disorders with radiculopathy, lumbar region: Secondary | ICD-10-CM | POA: Diagnosis not present

## 2023-02-13 DIAGNOSIS — M797 Fibromyalgia: Secondary | ICD-10-CM | POA: Diagnosis not present

## 2023-02-13 DIAGNOSIS — J45909 Unspecified asthma, uncomplicated: Secondary | ICD-10-CM | POA: Diagnosis not present

## 2023-02-13 DIAGNOSIS — M47816 Spondylosis without myelopathy or radiculopathy, lumbar region: Secondary | ICD-10-CM | POA: Diagnosis not present

## 2023-02-13 DIAGNOSIS — M4726 Other spondylosis with radiculopathy, lumbar region: Secondary | ICD-10-CM | POA: Diagnosis not present

## 2023-02-13 DIAGNOSIS — I10 Essential (primary) hypertension: Secondary | ICD-10-CM | POA: Diagnosis not present

## 2023-02-13 DIAGNOSIS — M4802 Spinal stenosis, cervical region: Secondary | ICD-10-CM | POA: Diagnosis not present

## 2023-04-03 DIAGNOSIS — G8929 Other chronic pain: Secondary | ICD-10-CM | POA: Diagnosis not present

## 2023-04-03 DIAGNOSIS — Z79899 Other long term (current) drug therapy: Secondary | ICD-10-CM | POA: Diagnosis not present

## 2023-04-03 DIAGNOSIS — M47816 Spondylosis without myelopathy or radiculopathy, lumbar region: Secondary | ICD-10-CM | POA: Diagnosis not present

## 2023-04-03 DIAGNOSIS — Z79891 Long term (current) use of opiate analgesic: Secondary | ICD-10-CM | POA: Diagnosis not present

## 2023-04-03 DIAGNOSIS — M9973 Connective tissue and disc stenosis of intervertebral foramina of lumbar region: Secondary | ICD-10-CM | POA: Diagnosis not present

## 2023-04-20 DIAGNOSIS — M5416 Radiculopathy, lumbar region: Secondary | ICD-10-CM | POA: Diagnosis not present

## 2023-04-20 DIAGNOSIS — M47812 Spondylosis without myelopathy or radiculopathy, cervical region: Secondary | ICD-10-CM | POA: Diagnosis not present

## 2023-04-20 DIAGNOSIS — Z79891 Long term (current) use of opiate analgesic: Secondary | ICD-10-CM | POA: Diagnosis not present

## 2023-04-20 DIAGNOSIS — Z5181 Encounter for therapeutic drug level monitoring: Secondary | ICD-10-CM | POA: Diagnosis not present

## 2023-04-20 DIAGNOSIS — G894 Chronic pain syndrome: Secondary | ICD-10-CM | POA: Diagnosis not present

## 2023-04-20 DIAGNOSIS — M48061 Spinal stenosis, lumbar region without neurogenic claudication: Secondary | ICD-10-CM | POA: Diagnosis not present

## 2023-04-30 ENCOUNTER — Ambulatory Visit: Payer: Medicare HMO | Admitting: Cardiology

## 2023-05-02 DIAGNOSIS — K219 Gastro-esophageal reflux disease without esophagitis: Secondary | ICD-10-CM | POA: Diagnosis not present

## 2023-05-02 DIAGNOSIS — R946 Abnormal results of thyroid function studies: Secondary | ICD-10-CM | POA: Diagnosis not present

## 2023-05-02 DIAGNOSIS — E782 Mixed hyperlipidemia: Secondary | ICD-10-CM | POA: Diagnosis not present

## 2023-05-02 DIAGNOSIS — I10 Essential (primary) hypertension: Secondary | ICD-10-CM | POA: Diagnosis not present

## 2023-05-02 DIAGNOSIS — E7849 Other hyperlipidemia: Secondary | ICD-10-CM | POA: Diagnosis not present

## 2023-05-02 DIAGNOSIS — R768 Other specified abnormal immunological findings in serum: Secondary | ICD-10-CM | POA: Diagnosis not present

## 2023-05-02 DIAGNOSIS — Z131 Encounter for screening for diabetes mellitus: Secondary | ICD-10-CM | POA: Diagnosis not present

## 2023-05-07 ENCOUNTER — Encounter: Payer: Self-pay | Admitting: Neurology

## 2023-05-09 DIAGNOSIS — Z6837 Body mass index (BMI) 37.0-37.9, adult: Secondary | ICD-10-CM | POA: Diagnosis not present

## 2023-05-09 DIAGNOSIS — E782 Mixed hyperlipidemia: Secondary | ICD-10-CM | POA: Diagnosis not present

## 2023-05-09 DIAGNOSIS — K2100 Gastro-esophageal reflux disease with esophagitis, without bleeding: Secondary | ICD-10-CM | POA: Diagnosis not present

## 2023-05-09 DIAGNOSIS — I10 Essential (primary) hypertension: Secondary | ICD-10-CM | POA: Diagnosis not present

## 2023-05-09 DIAGNOSIS — J4540 Moderate persistent asthma, uncomplicated: Secondary | ICD-10-CM | POA: Diagnosis not present

## 2023-05-09 DIAGNOSIS — M25511 Pain in right shoulder: Secondary | ICD-10-CM | POA: Diagnosis not present

## 2023-05-22 DIAGNOSIS — M47812 Spondylosis without myelopathy or radiculopathy, cervical region: Secondary | ICD-10-CM | POA: Diagnosis not present

## 2023-05-22 DIAGNOSIS — M25511 Pain in right shoulder: Secondary | ICD-10-CM | POA: Diagnosis not present

## 2023-05-22 DIAGNOSIS — M7551 Bursitis of right shoulder: Secondary | ICD-10-CM | POA: Diagnosis not present

## 2023-05-22 DIAGNOSIS — M797 Fibromyalgia: Secondary | ICD-10-CM | POA: Diagnosis not present

## 2023-05-26 ENCOUNTER — Other Ambulatory Visit: Payer: Self-pay | Admitting: Obstetrics and Gynecology

## 2023-05-26 DIAGNOSIS — N3281 Overactive bladder: Secondary | ICD-10-CM

## 2023-05-29 DIAGNOSIS — Z6837 Body mass index (BMI) 37.0-37.9, adult: Secondary | ICD-10-CM | POA: Diagnosis not present

## 2023-05-29 DIAGNOSIS — I10 Essential (primary) hypertension: Secondary | ICD-10-CM | POA: Diagnosis not present

## 2023-05-30 DIAGNOSIS — M47816 Spondylosis without myelopathy or radiculopathy, lumbar region: Secondary | ICD-10-CM | POA: Diagnosis not present

## 2023-05-30 DIAGNOSIS — M48061 Spinal stenosis, lumbar region without neurogenic claudication: Secondary | ICD-10-CM | POA: Diagnosis not present

## 2023-05-30 DIAGNOSIS — M5416 Radiculopathy, lumbar region: Secondary | ICD-10-CM | POA: Diagnosis not present

## 2023-05-30 DIAGNOSIS — G894 Chronic pain syndrome: Secondary | ICD-10-CM | POA: Diagnosis not present

## 2023-05-30 DIAGNOSIS — M5116 Intervertebral disc disorders with radiculopathy, lumbar region: Secondary | ICD-10-CM | POA: Diagnosis not present

## 2023-06-01 DIAGNOSIS — M25511 Pain in right shoulder: Secondary | ICD-10-CM | POA: Diagnosis not present

## 2023-06-01 DIAGNOSIS — M6281 Muscle weakness (generalized): Secondary | ICD-10-CM | POA: Diagnosis not present

## 2023-06-04 DIAGNOSIS — M47812 Spondylosis without myelopathy or radiculopathy, cervical region: Secondary | ICD-10-CM | POA: Diagnosis not present

## 2023-06-04 DIAGNOSIS — M47022 Vertebral artery compression syndromes, cervical region: Secondary | ICD-10-CM | POA: Diagnosis not present

## 2023-06-04 DIAGNOSIS — Z79899 Other long term (current) drug therapy: Secondary | ICD-10-CM | POA: Diagnosis not present

## 2023-06-05 DIAGNOSIS — M25511 Pain in right shoulder: Secondary | ICD-10-CM | POA: Diagnosis not present

## 2023-06-05 DIAGNOSIS — M6281 Muscle weakness (generalized): Secondary | ICD-10-CM | POA: Diagnosis not present

## 2023-06-06 ENCOUNTER — Encounter: Payer: Self-pay | Admitting: Cardiology

## 2023-06-06 ENCOUNTER — Ambulatory Visit: Payer: Medicare HMO | Attending: Cardiology | Admitting: Cardiology

## 2023-06-06 VITALS — BP 128/70 | HR 70 | Ht 64.0 in | Wt 216.0 lb

## 2023-06-06 DIAGNOSIS — I4891 Unspecified atrial fibrillation: Secondary | ICD-10-CM

## 2023-06-06 DIAGNOSIS — I10 Essential (primary) hypertension: Secondary | ICD-10-CM | POA: Diagnosis not present

## 2023-06-06 DIAGNOSIS — E782 Mixed hyperlipidemia: Secondary | ICD-10-CM

## 2023-06-06 DIAGNOSIS — I480 Paroxysmal atrial fibrillation: Secondary | ICD-10-CM

## 2023-06-06 MED ORDER — FLECAINIDE ACETATE 50 MG PO TABS: 50.0000 mg | ORAL_TABLET | Freq: Two times a day (BID) | ORAL | 3 refills | Status: DC
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
## 2023-06-06 NOTE — Patient Instructions (Signed)
 Medication Instructions:  Your physician has recommended you make the following change in your medication:  START: Flecainide 50 mg twice daily  *If you need a refill on your cardiac medications before your next appointment, please call your pharmacy*   Follow-Up: At [facility], you and your health needs are our priority.  As part of our continuing mission to provide you with exceptional heart care, we have created designated Provider Care Teams.  These Care Teams include your primary Cardiologist (physician) and Advanced Practice Providers (APPs -  Physician Assistants and Nurse Practitioners) who all work together to provide you with the care you need, when you need it.  Your next appointment:   16 week(s)  Provider:   Thomasene Ripple, DO     Other Instructions Please see our EP provider.

## 2023-06-07 DIAGNOSIS — I10 Essential (primary) hypertension: Secondary | ICD-10-CM | POA: Diagnosis not present

## 2023-06-11 DIAGNOSIS — M6281 Muscle weakness (generalized): Secondary | ICD-10-CM | POA: Diagnosis not present

## 2023-06-11 DIAGNOSIS — M25511 Pain in right shoulder: Secondary | ICD-10-CM | POA: Diagnosis not present

## 2023-06-25 ENCOUNTER — Encounter: Payer: Self-pay | Admitting: Cardiology

## 2023-06-25 ENCOUNTER — Ambulatory Visit: Attending: Cardiology | Admitting: Cardiology

## 2023-06-25 VITALS — BP 122/78 | HR 64 | Ht 64.0 in | Wt 213.0 lb

## 2023-06-25 DIAGNOSIS — Z01810 Encounter for preprocedural cardiovascular examination: Secondary | ICD-10-CM | POA: Diagnosis not present

## 2023-06-25 DIAGNOSIS — Z01812 Encounter for preprocedural laboratory examination: Secondary | ICD-10-CM

## 2023-06-25 DIAGNOSIS — D6869 Other thrombophilia: Secondary | ICD-10-CM

## 2023-06-25 DIAGNOSIS — I480 Paroxysmal atrial fibrillation: Secondary | ICD-10-CM | POA: Diagnosis not present

## 2023-06-25 DIAGNOSIS — I4891 Unspecified atrial fibrillation: Secondary | ICD-10-CM

## 2023-06-25 MED ORDER — FLECAINIDE ACETATE 100 MG PO TABS: 100.0000 mg | ORAL_TABLET | Freq: Two times a day (BID) | ORAL | 1 refills | Status: DC

## 2023-06-25 NOTE — Patient Instructions (Signed)
 Medication Instructions:  Your physician has recommended you make the following change in your medication:   ** Increase Flecainide to 100mg  - 1 tabletby mouth twice daily.  *If you need a refill on your cardiac medications before your next appointment, please call your pharmacy*  Lab Work: CBC and BMET - please have pre-procedure lab work completed on Monday, June 9,2025 . This can be done at ANY [facility] near you - no appointment required and this does not have to be fasting. If you have labs (blood work) drawn today and your tests are completely normal, you will receive your results only by: MyChart Message (if you have MyChart) OR A paper copy in the mail If you have any lab test that is abnormal or we need to change your treatment, we will call you to review the results.  Testing/Procedures: Cardiac CT - someone will contact you to schedule this  Your physician has requested that you have cardiac CT. Cardiac computed tomography (CT) is a painless test that uses an x-ray machine to take clear, detailed pictures of your heart. For further information please visit [email or web address] Please follow instruction sheet as given.   Atrial Fibrillation Ablation - scheduled on Monday 09/10/2023. We will be in contact closer to your ablation date with further instructions Your physician has recommended that you have an ablation. Catheter ablation is a medical procedure used to treat some cardiac arrhythmias (irregular heartbeats). During catheter ablation, a long, thin, flexible tube is put into a blood vessel in your groin (upper thigh), or neck. This tube is called an ablation catheter. It is then guided to your heart through the blood vessel. Radio frequency waves destroy small areas of heart tissue where abnormal heartbeats may cause an arrhythmia to start. Please see the instruction sheet given to you today.  Follow-Up: At [facility], you and your health needs are our priority.  As  part of our continuing mission to provide you with exceptional heart care, our providers are all part of one team.  This team includes your primary Cardiologist (physician) and Advanced Practice Providers or APPs (Physician Assistants and Nurse Practitioners) who all work together to provide you with the care you need, when you need it.  Your next appointment:   2 week NV for EKG due to Flecainide increaser  We will schedule follow up after your ablation  Provider:   Dr Elberta Fortis   Cardiac Ablation Cardiac ablation is a procedure to destroy, or ablate, a small amount of heart tissue that is causing problems. The heart has many electrical connections. Sometimes, these connections are abnormal and can cause the heart to beat very fast or irregularly. Ablating the abnormal areas can improve the heart's rhythm or return it to normal. Ablation may be done for people who: Have irregular or rapid heartbeats (arrhythmias). Have Wolff-Parkinson-White syndrome. Have taken medicines for an arrhythmia that did not work or caused side effects. Have a high-risk heartbeat that may be life-threatening. Tell a health care provider about: Any allergies you have. All medicines you are taking, including vitamins, herbs, eye drops, creams, and over-the-counter medicines. Any problems you or family members have had with anesthesia. Any bleeding problems you have. Any surgeries you have had. Any medical conditions you have. Whether you are pregnant or may be pregnant. What are the risks? Your health care provider will talk with you about risks. These may include: Infection. Bruising and bleeding. Stroke or blood clots. Damage to nearby structures or organs. Allergic reaction to  medicines or dyes. Needing a pacemaker if the heart gets damaged. A pacemaker is a device that helps the heart beat normally. Failure of the procedure. A repeat procedure may be needed. What happens before the  procedure? Medicines Ask your health care provider about: Changing or stopping your regular medicines. These include any heart rhythm medicines, diabetes medicines, or blood thinners you take. Taking medicines such as aspirin and ibuprofen. These medicines can thin your blood. Do not take them unless your health care provider tells you to. Taking over-the-counter medicines, vitamins, herbs, and supplements. General instructions Follow instructions from your health care provider about what you may eat and drink. If you will be going home right after the procedure, plan to have a responsible adult: Take you home from the hospital or clinic. You will not be allowed to drive. Care for you for the time you are told. Ask your health care provider what steps will be taken to prevent infection. What happens during the procedure?  An IV will be inserted into one of your veins. You may be given: A sedative. This helps you relax. Anesthesia. This will: Numb certain areas of your body. An incision will be made in your neck or your groin. A needle will be inserted through the incision and into a large vein in your neck or groin. The small, thin tube (catheter) will be inserted through the needle and moved to your heart. A type of X-ray (fluoroscopy) will be used to help guide the catheter and provide images of the heart on a monitor. Dye may be injected through the catheter to help your surgeon see the area of the heart that needs treatment. Electrical currents will be sent from the catheter to destroy heart tissue in certain areas. There are three types of energy that may be used to do this: Heat (radiofrequency energy). Laser energy. Extreme cold (cryoablation). When the tissue has been destroyed, the catheter will be removed. Pressure will be held on the insertion area to prevent bleeding. A bandage (dressing) will be placed over the insertion area. The procedure may vary among health care  providers and hospitals. What happens after the procedure? Your blood pressure, heart rate and rhythm, breathing rate, and blood oxygen level will be monitored until you leave the hospital or clinic. Your insertion area will be checked for bleeding. You will need to lie still for a few hours. If your groin was used, you will need to keep your leg straight for a few hours after the catheter is removed. This information is not intended to replace advice given to you by your health care provider. Make sure you discuss any questions you have with your health care provider. Document Revised: 08/16/2021 Document Reviewed: 08/16/2021 Elsevier Patient Education  2024 [employer].

## 2023-06-25 NOTE — Progress Notes (Signed)
 Electrophysiology Office Note:   Date:  06/25/2023  ID:  Theresa Lucas, DOB 1960-09-02, MRN [id number]  Primary Cardiologist: Kardie Tobb, DO Primary Heart Failure: None Electrophysiologist: Wong Steadham Cortland Ding, MD      History of Present Illness:   Theresa Lucas is a 63 y.o. female with h/o atrial fibrillation, hypertension, prediabetes, hyperlipidemia, nonobstructive coronary artery disease, May-Thurner syndrome, myasthenia gravis seen today for  for Electrophysiology evaluation of atrial fibrillation at the request of Kardie Tobb.    She wore a cardiac monitor that showed intermittent episodes of atrial fibrillation.  She is currently on Xarelto.  She feels that her heart rate increases with stress.  She believes that this is a trigger for her atrial fibrillation.  She does have a family history of atrial fibrillation with a sister undergoing multiple ablations.  Today, denies symptoms of palpitations, chest pain, shortness of breath, orthopnea, PND, lower extremity edema, claudication, dizziness, presyncope, syncope, bleeding, or neurologic sequela. The patient is tolerating medications without difficulties.  She has been started on flecainide.  Fortunately her episodes have been much less frequent, though she has continued to have episodes.  She feels palpitations, mild chest discomfort and a catching sensation in her chest.  When she is in normal rhythm she feels well and is able to do her daily activities.  Review of systems complete and found to be negative unless listed in HPI.   EP Information / Studies Reviewed:    EKG is ordered today. Personal review as below.  EKG Interpretation Date/Time:  Monday June 25 2023 09:00:55 EDT Ventricular Rate:  64 PR Interval:  180 QRS Duration:  88 QT Interval:  390 QTC Calculation: 402 R Axis:   91  Text Interpretation: Normal sinus rhythm Rightward axis When compared with ECG of 06-Jun-2023 09:57, Questionable change in  QRS axis Confirmed by Ulyana Pitones ([id number]) on 06/25/2023 9:09:53 AM     Risk Assessment/Calculations:    CHA2DS2-VASc Score = 3   This indicates a 3.2% annual risk of stroke. The patient's score is based upon: CHF History: 0 HTN History: 1 Diabetes History: 0 Stroke History: 0 Vascular Disease History: 1 Age Score: 0 Gender Score: 1            Physical Exam:   VS:  BP 122/78 (BP Location: Left Arm, Patient Position: Sitting, Cuff Size: Large)   Pulse 64   Ht 5\' 4"  (1.626 m)   Wt 213 lb (96.6 kg)   SpO2 97%   BMI 36.56 kg/m    Wt Readings from Last 3 Encounters:  06/25/23 213 lb (96.6 kg)  06/06/23 216 lb (98 kg)  02/12/23 210 lb (95.3 kg)     GEN: Well nourished, well developed in no acute distress NECK: No JVD; No carotid bruits CARDIAC: Regular rate and rhythm, no murmurs, rubs, gallops RESPIRATORY:  Clear to auscultation without rales, wheezing or rhonchi  ABDOMEN: Soft, non-tender, non-distended EXTREMITIES:  No edema; No deformity   ASSESSMENT AND PLAN:    1.  Paroxysmal atrial fibrillation: Currently on flecainide 50 mg twice daily, diltiazem to 40 mg daily.  She is continuing to have episodes of atrial fibrillation.  She would prefer to avoid long-term antiarrhythmic medications.  Due to this, we Chrislynn Mosely plan for ablation.  Risk and benefits have been discussed.  She understands the risks and is agreed to the procedure.  Jedrek Dinovo increase her flecainide to 100 mg twice daily until the ablation.  She Shaily Librizzi return in 2  weeks for an EKG.  Risk, benefits, and alternatives to EP study and radiofrequency/pulse field ablation for afib were also discussed in detail today. These risks include but are not limited to stroke, bleeding, vascular damage, tamponade, perforation, damage to the esophagus, lungs, and other structures, pulmonary vein stenosis, worsening renal function, and death. The patient understands these risk and wishes to proceed.  We Rustyn Conery therefore proceed with  catheter ablation at the next available time.  Carto, ICE, anesthesia are requested for the procedure.  Raven Harmes also obtain CT PV protocol prior to the procedure to exclude LAA thrombus and further evaluate atrial anatomy.  2.  Secondary hypercoagulable state: Currently on Xarelto 20 mg daily for atrial fibrillation  3.  Hyperlipidemia: Continue statin per primary cardiology  Follow up with [facility] Clinic as usual post procedure  Signed, Shirly Bartosiewicz Cortland Ding, MD

## 2023-07-04 ENCOUNTER — Encounter: Payer: Self-pay | Admitting: Obstetrics and Gynecology

## 2023-07-04 ENCOUNTER — Ambulatory Visit (INDEPENDENT_AMBULATORY_CARE_PROVIDER_SITE_OTHER): Admitting: Obstetrics and Gynecology

## 2023-07-04 DIAGNOSIS — N3281 Overactive bladder: Secondary | ICD-10-CM

## 2023-07-04 MED ORDER — GEMTESA 75 MG PO TABS
1.0000 | ORAL_TABLET | Freq: Every day | ORAL | 3 refills | Status: AC
Start: 2023-07-04 — End: ?
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
## 2023-07-09 ENCOUNTER — Ambulatory Visit: Attending: Cardiology

## 2023-07-09 VITALS — HR 64 | Ht 64.0 in | Wt 219.8 lb

## 2023-07-09 DIAGNOSIS — I480 Paroxysmal atrial fibrillation: Secondary | ICD-10-CM | POA: Diagnosis not present

## 2023-07-09 NOTE — Progress Notes (Unsigned)
   Nurse Visit   Date of Encounter: 07/09/2023 ID: Theresa Lucas, DOB 05-Dec-1960, MRN [id number]  PCP:  Theresa Pulling, MD   [facility] Providers Cardiologist:  Jerryl Morin, DO Electrophysiologist:  Will Cortland Ding, MD { Click to update primary MD,subspecialty MD or APP then REFRESH:1}     Visit Details   VS:  Pulse 64   Ht 5\' 4"  (1.626 m)   Wt 219 lb 12.8 oz (99.7 kg)   BMI 37.73 kg/m  , BMI Body mass index is 37.73 kg/m.  Wt Readings from Last 3 Encounters:  07/09/23 219 lb 12.8 oz (99.7 kg)  06/25/23 213 lb (96.6 kg)  06/06/23 216 lb (98 kg)     Reason for visit: EKG Performed today: Vitals, EKG, Provider consulted:Dr. Lawana Pray, and Education Changes (medications, testing, etc.) : none Length of Visit: 10 minutes    Medications Adjustments/Labs and Tests Ordered: Orders Placed This Encounter  Procedures   EKG 12-Lead   No orders of the defined types were placed in this encounter.    Signed, Beola Vasallo Wayman Hai, LPN  08/07/7822 2:35 PM

## 2023-07-20 DIAGNOSIS — Z79891 Long term (current) use of opiate analgesic: Secondary | ICD-10-CM | POA: Diagnosis not present

## 2023-07-20 DIAGNOSIS — Z5181 Encounter for therapeutic drug level monitoring: Secondary | ICD-10-CM | POA: Diagnosis not present

## 2023-07-20 DIAGNOSIS — M5416 Radiculopathy, lumbar region: Secondary | ICD-10-CM | POA: Diagnosis not present

## 2023-07-20 DIAGNOSIS — M48061 Spinal stenosis, lumbar region without neurogenic claudication: Secondary | ICD-10-CM | POA: Diagnosis not present

## 2023-07-20 DIAGNOSIS — G894 Chronic pain syndrome: Secondary | ICD-10-CM | POA: Diagnosis not present

## 2023-07-20 DIAGNOSIS — M47812 Spondylosis without myelopathy or radiculopathy, cervical region: Secondary | ICD-10-CM | POA: Diagnosis not present
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
## 2023-08-01 ENCOUNTER — Encounter (HOSPITAL_COMMUNITY): Payer: Self-pay

## 2023-08-02 ENCOUNTER — Telehealth (HOSPITAL_COMMUNITY): Payer: Self-pay | Admitting: *Deleted

## 2023-08-02 NOTE — Telephone Encounter (Signed)
Attempted to call patient regarding upcoming cardiac CT appointment. Voicemail box full.  Larey Brick RN Navigator Cardiac Imaging [facility] [phone] Office [phone] Cell

## 2023-08-02 NOTE — Telephone Encounter (Signed)
Patient returning call about her upcoming cardiac imaging study; pt verbalizes understanding of appt date/time, parking situation and where to check in, pre-test NPO status and verified current allergies; name and call back number provided for further questions should they arise  Larey Brick RN Navigator Cardiac Imaging [facility] [phone] office [phone] cell

## 2023-08-03 ENCOUNTER — Ambulatory Visit (HOSPITAL_COMMUNITY)
Admission: RE | Admit: 2023-08-03 | Discharge: 2023-08-03 | Disposition: A | Discharge status: Home / Self Care | Source: Ambulatory Visit | Attending: Cardiology | Admitting: Cardiology

## 2023-08-03 DIAGNOSIS — I480 Paroxysmal atrial fibrillation: Secondary | ICD-10-CM | POA: Insufficient documentation

## 2023-08-03 DIAGNOSIS — I4891 Unspecified atrial fibrillation: Secondary | ICD-10-CM | POA: Diagnosis not present

## 2023-08-03 MED ORDER — IOHEXOL 350 MG/ML SOLN
100.0000 mL | Freq: Once | INTRAVENOUS | Status: AC | PRN
  Administered 2023-08-03: 100 mL via INTRAVENOUS

## 2023-08-04 DIAGNOSIS — H52223 Regular astigmatism, bilateral: Secondary | ICD-10-CM | POA: Diagnosis not present

## 2023-08-08 ENCOUNTER — Ambulatory Visit (INDEPENDENT_AMBULATORY_CARE_PROVIDER_SITE_OTHER): Payer: Medicare HMO | Admitting: Neurology

## 2023-08-08 ENCOUNTER — Encounter: Payer: Self-pay | Admitting: Neurology

## 2023-08-08 ENCOUNTER — Ambulatory Visit: Payer: Self-pay | Admitting: Cardiology

## 2023-08-08 VITALS — BP 129/60 | HR 73 | Ht 64.0 in | Wt 218.0 lb

## 2023-08-08 DIAGNOSIS — M797 Fibromyalgia: Secondary | ICD-10-CM | POA: Diagnosis not present

## 2023-08-08 DIAGNOSIS — H532 Diplopia: Secondary | ICD-10-CM | POA: Diagnosis not present

## 2023-08-08 DIAGNOSIS — R471 Dysarthria and anarthria: Secondary | ICD-10-CM | POA: Diagnosis not present

## 2023-08-08 DIAGNOSIS — H02402 Unspecified ptosis of left eyelid: Secondary | ICD-10-CM | POA: Diagnosis not present

## 2023-08-08 DIAGNOSIS — G7000 Myasthenia gravis without (acute) exacerbation: Secondary | ICD-10-CM

## 2023-08-08 MED ORDER — PREDNISONE 5 MG PO TABS: 5.0000 mg | ORAL_TABLET | Freq: Every day | ORAL | 11 refills | Status: DC

## 2023-08-08 MED ORDER — PYRIDOSTIGMINE BROMIDE 60 MG PO TABS
60.0000 mg | ORAL_TABLET | Freq: Three times a day (TID) | ORAL | 3 refills | Status: AC | PRN
Start: 2023-08-08 — End: ?

## 2023-08-08 NOTE — Patient Instructions (Signed)
-  Continue prednisone  5 mg daily. May consider reducing after ablation for afib -Continue mestinon  60 mg three times daily as needed  -Continue Pepcid  -Continue Vit D 1000 international units daily -Continue Calcium  600 mg daily  Return to clinic in 6 months  Go to nearest emergency room if you have severe weakness, difficulty breathing or swallowing as this could be a myasthenic crisis (flare).  The physicians and staff at [facility] are committed to providing excellent care. You may receive a survey requesting feedback about your experience at our office. We strive to receive "very good" responses to the survey questions. If you feel that your experience would prevent you from giving the office a "very good " response, please contact our office to try to remedy the situation. We may be reached at [phone]. Thank you for taking the time out of your busy day to complete the survey.  Rommie Coats, MD [facility]

## 2023-08-11 DIAGNOSIS — H524 Presbyopia: Secondary | ICD-10-CM | POA: Diagnosis not present

## 2023-08-16 DIAGNOSIS — E041 Nontoxic single thyroid nodule: Secondary | ICD-10-CM | POA: Diagnosis not present

## 2023-08-16 DIAGNOSIS — E7849 Other hyperlipidemia: Secondary | ICD-10-CM | POA: Diagnosis not present

## 2023-08-16 DIAGNOSIS — K2100 Gastro-esophageal reflux disease with esophagitis, without bleeding: Secondary | ICD-10-CM | POA: Diagnosis not present

## 2023-08-16 DIAGNOSIS — I10 Essential (primary) hypertension: Secondary | ICD-10-CM | POA: Diagnosis not present

## 2023-08-16 DIAGNOSIS — M797 Fibromyalgia: Secondary | ICD-10-CM | POA: Diagnosis not present

## 2023-08-16 DIAGNOSIS — Z1321 Encounter for screening for nutritional disorder: Secondary | ICD-10-CM | POA: Diagnosis not present

## 2023-08-20 DIAGNOSIS — D6869 Other thrombophilia: Secondary | ICD-10-CM | POA: Diagnosis not present

## 2023-08-20 DIAGNOSIS — Z01812 Encounter for preprocedural laboratory examination: Secondary | ICD-10-CM | POA: Diagnosis not present

## 2023-08-20 DIAGNOSIS — I480 Paroxysmal atrial fibrillation: Secondary | ICD-10-CM | POA: Diagnosis not present

## 2023-08-20 DIAGNOSIS — Z01810 Encounter for preprocedural cardiovascular examination: Secondary | ICD-10-CM | POA: Diagnosis not present

## 2023-08-21 LAB — BASIC METABOLIC PANEL WITH GFR
BUN/Creatinine Ratio: 14 (ref 12–28)
BUN: 15 mg/dL (ref 8–27)
CO2: 21 mmol/L (ref 20–29)
Calcium: 10 mg/dL (ref 8.7–10.3)
Chloride: 100 mmol/L (ref 96–106)
Creatinine, Ser: 1.06 mg/dL — ABNORMAL HIGH (ref 0.57–1.00)
Glucose: 119 mg/dL — ABNORMAL HIGH (ref 70–99)
Potassium: 4.4 mmol/L (ref 3.5–5.2)
Sodium: 139 mmol/L (ref 134–144)
eGFR: 60 mL/min/{1.73_m2} (ref >59)

## 2023-08-21 LAB — CBC
Hematocrit: 46.9 % — ABNORMAL HIGH (ref 34.0–46.6)
Hemoglobin: 15.3 g/dL (ref 11.1–15.9)
MCH: 29.8 pg (ref 26.6–33.0)
MCHC: 32.6 g/dL (ref 31.5–35.7)
MCV: 91 fL (ref 79–97)
Platelets: 348 10*3/uL (ref 150–450)
RBC: 5.14 x10E6/uL (ref 3.77–5.28)
RDW: 12.2 % (ref 11.7–15.4)
WBC: 8.3 10*3/uL (ref 3.4–10.8)

## 2023-08-28 DIAGNOSIS — Z6837 Body mass index (BMI) 37.0-37.9, adult: Secondary | ICD-10-CM | POA: Diagnosis not present

## 2023-08-28 DIAGNOSIS — E782 Mixed hyperlipidemia: Secondary | ICD-10-CM | POA: Diagnosis not present

## 2023-08-28 DIAGNOSIS — J4540 Moderate persistent asthma, uncomplicated: Secondary | ICD-10-CM | POA: Diagnosis not present

## 2023-08-28 DIAGNOSIS — E7849 Other hyperlipidemia: Secondary | ICD-10-CM | POA: Diagnosis not present

## 2023-08-28 DIAGNOSIS — I10 Essential (primary) hypertension: Secondary | ICD-10-CM | POA: Diagnosis not present

## 2023-08-28 DIAGNOSIS — K2100 Gastro-esophageal reflux disease with esophagitis, without bleeding: Secondary | ICD-10-CM | POA: Diagnosis not present

## 2023-08-31 DIAGNOSIS — Z6836 Body mass index (BMI) 36.0-36.9, adult: Secondary | ICD-10-CM | POA: Diagnosis not present

## 2023-08-31 DIAGNOSIS — J209 Acute bronchitis, unspecified: Secondary | ICD-10-CM | POA: Diagnosis not present

## 2023-09-03 ENCOUNTER — Telehealth (HOSPITAL_COMMUNITY): Payer: Self-pay

## 2023-09-03 NOTE — Telephone Encounter (Signed)
 Spoke with patient to discuss upcoming procedure.   CT: completed.  Labs: completed.   Any recent signs of acute illness or been started on antibiotics? No Any new medications started? No Any medications to hold? Hold Wegovy 7 days prior to procedure- last dose on June 20.  Any missed doses of blood thinner? No Advised patient to continue taking ANTICOAGULANT: Xarelto  (Rivaroxaban ) daily without missing any doses.  Medication instructions:  On the morning of your procedure DO NOT take any medication., including Xarelto  or the procedure may be rescheduled. Nothing to eat or drink after midnight prior to your procedure.  Confirmed patient is scheduled for Atrial Fibrillation Ablation on Monday, June 30 with Dr. Soyla Norton. Instructed patient to arrive at the Main Entrance A at [facility]: [address] [place], [place] [address] and check in at Admitting at 5:30 AM.  Advised of plan to go home the same day and will only stay overnight if medically necessary. You MUST have a responsible adult to drive you home and MUST be with you the first 24 hours after you arrive home or your procedure could be cancelled.  Patient verbalized understanding to all instructions provided and agreed to proceed with procedure.

## 2023-09-07 NOTE — Pre-Procedure Instructions (Signed)
 Instructed patient on the following items: Arrival time 0515 Nothing to eat or drink after midnight No meds AM of procedure Responsible person to drive you home and stay with you for 24 hrs  Have you missed any doses of anti-coagulant Xarelto - takes once a day, hasn't missed any doses in last 4 weeks.   Last Ozempic was 6/20, knows to not take today.

## 2023-09-10 ENCOUNTER — Ambulatory Visit (HOSPITAL_COMMUNITY)
Admission: RE | Disposition: A | Discharge status: Home / Self Care | Payer: Self-pay | Source: Home / Self Care | Attending: Cardiology

## 2023-09-10 ENCOUNTER — Ambulatory Visit (HOSPITAL_COMMUNITY): Admitting: Registered Nurse

## 2023-09-10 ENCOUNTER — Other Ambulatory Visit: Payer: Self-pay

## 2023-09-10 ENCOUNTER — Ambulatory Visit (HOSPITAL_COMMUNITY)
Admission: RE | Admit: 2023-09-10 | Discharge: 2023-09-10 | Disposition: A | Discharge status: Home / Self Care | Attending: Cardiology | Admitting: Cardiology

## 2023-09-10 ENCOUNTER — Encounter (HOSPITAL_COMMUNITY): Payer: Self-pay | Admitting: Cardiology

## 2023-09-10 DIAGNOSIS — Z7901 Long term (current) use of anticoagulants: Secondary | ICD-10-CM | POA: Insufficient documentation

## 2023-09-10 DIAGNOSIS — J45909 Unspecified asthma, uncomplicated: Secondary | ICD-10-CM | POA: Diagnosis not present

## 2023-09-10 DIAGNOSIS — G7000 Myasthenia gravis without (acute) exacerbation: Secondary | ICD-10-CM | POA: Insufficient documentation

## 2023-09-10 DIAGNOSIS — I10 Essential (primary) hypertension: Secondary | ICD-10-CM | POA: Diagnosis not present

## 2023-09-10 DIAGNOSIS — I480 Paroxysmal atrial fibrillation: Secondary | ICD-10-CM | POA: Diagnosis not present

## 2023-09-10 DIAGNOSIS — Z79899 Other long term (current) drug therapy: Secondary | ICD-10-CM | POA: Diagnosis not present

## 2023-09-10 DIAGNOSIS — F418 Other specified anxiety disorders: Secondary | ICD-10-CM | POA: Diagnosis not present

## 2023-09-10 DIAGNOSIS — I4891 Unspecified atrial fibrillation: Secondary | ICD-10-CM | POA: Diagnosis not present

## 2023-09-10 DIAGNOSIS — K219 Gastro-esophageal reflux disease without esophagitis: Secondary | ICD-10-CM | POA: Diagnosis not present

## 2023-09-10 HISTORY — PX: ATRIAL FIBRILLATION ABLATION: EP1191

## 2023-09-10 LAB — POCT ACTIVATED CLOTTING TIME: Activated Clotting Time: 320 s

## 2023-09-10 SURGERY — ATRIAL FIBRILLATION ABLATION
Anesthesia: General

## 2023-09-10 MED ORDER — DEXAMETHASONE SODIUM PHOSPHATE 10 MG/ML IJ SOLN
INTRAMUSCULAR | Status: DC | PRN
  Administered 2023-09-10: 5 mg via INTRAVENOUS

## 2023-09-10 MED ORDER — ONDANSETRON HCL 4 MG/2ML IJ SOLN: 4.0000 mg | Freq: Four times a day (QID) | INTRAMUSCULAR | Status: DC | PRN

## 2023-09-10 MED ORDER — FENTANYL CITRATE (PF) 250 MCG/5ML IJ SOLN
INTRAMUSCULAR | Status: DC | PRN
Start: 2023-09-10 — End: 2023-09-10
  Administered 2023-09-10 (×2): 50 ug via INTRAVENOUS

## 2023-09-10 MED ORDER — SODIUM CHLORIDE 0.9 % IV SOLN
INTRAVENOUS | Status: DC
Start: 2023-09-10 — End: 2023-09-10

## 2023-09-10 MED ORDER — IOHEXOL 350 MG/ML SOLN
INTRAVENOUS | Status: DC | PRN
  Administered 2023-09-10: 7 mL

## 2023-09-10 MED ORDER — SUGAMMADEX SODIUM 200 MG/2ML IV SOLN
INTRAVENOUS | Status: DC | PRN
  Administered 2023-09-10: 198.6 mg via INTRAVENOUS

## 2023-09-10 MED ORDER — ROCURONIUM BROMIDE 10 MG/ML (PF) SYRINGE
PREFILLED_SYRINGE | INTRAVENOUS | Status: DC | PRN
  Administered 2023-09-10: 40 mg via INTRAVENOUS

## 2023-09-10 MED ORDER — FENTANYL CITRATE (PF) 100 MCG/2ML IJ SOLN
INTRAMUSCULAR | Status: AC
  Filled 2023-09-10: qty 2

## 2023-09-10 MED ORDER — ACETAMINOPHEN 325 MG PO TABS: 650.0000 mg | ORAL_TABLET | ORAL | Status: DC | PRN

## 2023-09-10 MED ORDER — SODIUM CHLORIDE 0.9 % IV SOLN
0.1500 ug/kg/min | Freq: Once | INTRAVENOUS | Status: AC
  Administered 2023-09-10: .1 ug/kg/min via INTRAVENOUS
  Filled 2023-09-10: qty 2000

## 2023-09-10 MED ORDER — PROTAMINE SULFATE 10 MG/ML IV SOLN
INTRAVENOUS | Status: DC | PRN
  Administered 2023-09-10: 30 mg via INTRAVENOUS
  Administered 2023-09-10: 10 mg via INTRAVENOUS

## 2023-09-10 MED ORDER — PROPOFOL 10 MG/ML IV BOLUS
INTRAVENOUS | Status: DC | PRN
  Administered 2023-09-10: 150 mg via INTRAVENOUS
  Administered 2023-09-10: 50 mg via INTRAVENOUS

## 2023-09-10 MED ORDER — ONDANSETRON HCL 4 MG/2ML IJ SOLN
INTRAMUSCULAR | Status: DC | PRN
  Administered 2023-09-10: 4 mg via INTRAVENOUS

## 2023-09-10 MED ORDER — SODIUM CHLORIDE 0.9 % IV SOLN
0.1500 ug/kg/min | INTRAVENOUS | Status: DC
  Filled 2023-09-10 (×4): qty 2000

## 2023-09-10 MED ORDER — SODIUM CHLORIDE 0.9% FLUSH: 3.0000 mL | Freq: Two times a day (BID) | INTRAVENOUS | Status: DC

## 2023-09-10 MED ORDER — SODIUM CHLORIDE 0.9% FLUSH: 3.0000 mL | INTRAVENOUS | Status: DC | PRN

## 2023-09-10 MED ORDER — PHENYLEPHRINE HCL-NACL 20-0.9 MG/250ML-% IV SOLN
INTRAVENOUS | Status: DC | PRN
  Administered 2023-09-10: 20 ug/min via INTRAVENOUS

## 2023-09-10 MED ORDER — SODIUM CHLORIDE 0.9 % IV SOLN: 250.0000 mL | INTRAVENOUS | Status: DC | PRN

## 2023-09-10 MED ORDER — PROPOFOL 500 MG/50ML IV EMUL
INTRAVENOUS | Status: DC | PRN
  Administered 2023-09-10: 150 ug/kg/min via INTRAVENOUS

## 2023-09-10 MED ORDER — ATROPINE SULFATE 1 MG/ML IV SOLN
INTRAVENOUS | Status: DC | PRN
Start: 2023-09-10 — End: 2023-09-10
  Administered 2023-09-10: 1 mg via INTRAVENOUS

## 2023-09-10 MED ORDER — MIDAZOLAM HCL 2 MG/2ML IJ SOLN
INTRAMUSCULAR | Status: AC
  Filled 2023-09-10: qty 2

## 2023-09-10 MED ORDER — PYRIDOSTIGMINE BROMIDE 60 MG PO TABS
60.0000 mg | ORAL_TABLET | ORAL | Status: AC
  Administered 2023-09-10: 60 mg via ORAL
  Filled 2023-09-10: qty 1

## 2023-09-10 MED ORDER — HEPARIN (PORCINE) IN NACL 1000-0.9 UT/500ML-% IV SOLN
INTRAVENOUS | Status: DC | PRN
  Administered 2023-09-10 (×3): 500 mL

## 2023-09-10 MED ORDER — ACETAMINOPHEN 500 MG PO TABS
1000.0000 mg | ORAL_TABLET | Freq: Once | ORAL | Status: AC
  Administered 2023-09-10: 1000 mg via ORAL
  Filled 2023-09-10: qty 2

## 2023-09-10 MED ORDER — HEPARIN SODIUM (PORCINE) 1000 UNIT/ML IJ SOLN
INTRAMUSCULAR | Status: DC | PRN
  Administered 2023-09-10: 14000 [IU] via INTRAVENOUS

## 2023-09-10 MED ORDER — LIDOCAINE 2% (20 MG/ML) 5 ML SYRINGE
INTRAMUSCULAR | Status: DC | PRN
  Administered 2023-09-10: 60 mg via INTRAVENOUS

## 2023-09-10 SURGICAL SUPPLY — 21 items
BAG SNAP BAND KOVER 36X36 (MISCELLANEOUS) IMPLANT
CABLE FARASTAR GEN2 SNGL USE (CABLE) IMPLANT
CATH FARAWAVE 2.0 31 (CATHETERS) IMPLANT
CATH GE 8FR SOUNDSTAR (CATHETERS) IMPLANT
CATH OCTARAY 2.0 F 3-3-3-3-3 (CATHETERS) IMPLANT
CATH WEBSTER BI DIR CS D-F CRV (CATHETERS) IMPLANT
CLOSURE MYNX CONTROL 6F/7F (Vascular Products) IMPLANT
CLOSURE PERCLOSE PROSTYLE (VASCULAR PRODUCTS) IMPLANT
COVER SWIFTLINK CONNECTOR (BAG) ×1 IMPLANT
DILATOR VESSEL 38 20CM 16FR (INTRODUCER) IMPLANT
GUIDEWIRE INQWIRE 1.5J.035X260 (WIRE) IMPLANT
KIT VERSACROSS CNCT FARADRIVE (KITS) IMPLANT
MAT PREVALON FULL STRYKER (MISCELLANEOUS) IMPLANT
PACK EP LF (CUSTOM PROCEDURE TRAY) ×1 IMPLANT
PAD DEFIB RADIO PHYSIO CONN (PAD) ×1 IMPLANT
PATCH CARTO3 (PAD) IMPLANT
SHEATH FARADRIVE STEERABLE (SHEATH) IMPLANT
SHEATH PINNACLE 7F 10CM (SHEATH) IMPLANT
SHEATH PINNACLE 9F 10CM (SHEATH) IMPLANT
SHEATH PROBE COVER 6X72 (BAG) IMPLANT
WIRE HITORQ VERSACORE ST 145CM (WIRE) IMPLANT

## 2023-09-10 NOTE — Anesthesia Preprocedure Evaluation (Addendum)
 Anesthesia Evaluation  Patient identified by MRN, date of birth, ID band Patient awake    Reviewed: Allergy & Precautions, NPO status , Patient's Chart, lab work & pertinent test results  Airway Mallampati: II  TM Distance: >3 FB Neck ROM: Full    Dental  (+) Dental Advisory Given   Pulmonary asthma    breath sounds clear to auscultation       Cardiovascular hypertension, Pt. on medications + dysrhythmias Atrial Fibrillation  Rhythm:Regular Rate:Normal     Neuro/Psych  Neuromuscular disease (Myasthenia gravis)    GI/Hepatic Neg liver ROS,GERD  ,,  Endo/Other  negative endocrine ROS    Renal/GU negative Renal ROS     Musculoskeletal  (+) Arthritis ,  Fibromyalgia -  Abdominal   Peds  Hematology negative hematology ROS (+)   Anesthesia Other Findings   Reproductive/Obstetrics                             Anesthesia Physical Anesthesia Plan  ASA: 3  Anesthesia Plan: General   Post-op Pain Management: Tylenol  PO (pre-op)*   Induction: Intravenous  PONV Risk Score and Plan: 3 and Dexamethasone , Ondansetron , Treatment may vary due to age or medical condition, TIVA and Propofol  infusion  Airway Management Planned: Oral ETT  Additional Equipment:   Intra-op Plan:   Post-operative Plan: Extubation in OR and Possible Post-op intubation/ventilation  Informed Consent: I have reviewed the patients History and Physical, chart, labs and discussed the procedure including the risks, benefits and alternatives for the proposed anesthesia with the patient or authorized representative who has indicated his/her understanding and acceptance.     Dental advisory given  Plan Discussed with: CRNA  Anesthesia Plan Comments: (Remi gtt.)       Anesthesia Quick Evaluation

## 2023-09-10 NOTE — Progress Notes (Signed)
Patient ambulated to BR. Right groin remains unremarkable.

## 2023-09-10 NOTE — Discharge Instructions (Signed)

## 2023-09-10 NOTE — Transfer of Care (Signed)
 Immediate Anesthesia Transfer of Care Note  Patient: Theresa Lucas  Procedure(s) Performed: ATRIAL FIBRILLATION ABLATION  Patient Location: PACU  Anesthesia Type:General  Level of Consciousness: awake  Airway & Oxygen  Therapy: Patient Spontanous Breathing  Post-op Assessment: Report given to RN and Post -op Vital signs reviewed and stable  Post vital signs: Reviewed and stable  Last Vitals:  Vitals Value Taken Time  BP 115/61 09/10/23 09:20  Temp 37.3 C 09/10/23 09:19  Pulse 78 09/10/23 09:20  Resp 16 09/10/23 09:20  SpO2 95 % 09/10/23 09:20  Vitals shown include unfiled device data.  Last Pain:  Vitals:   09/10/23 0919  TempSrc: Oral  PainSc: 0-No pain      Patients Stated Pain Goal: 4 (09/10/23 0549)  Complications: There were no known notable events for this encounter.

## 2023-09-10 NOTE — H&P (Signed)
  Electrophysiology Office Note:   Date:  09/10/2023  ID:  Theresa Lucas, DOB 06/15/1960, MRN [id number]  Primary Cardiologist: Kardie Tobb, DO Primary Heart Failure: None Electrophysiologist: Alora Gorey Gladis Norton, MD      History of Present Illness:   Theresa Lucas is a 62 y.o. female with h/o atrial fibrillation, hypertension, prediabetes, hyperlipidemia, nonobstructive coronary artery disease, May-Thurner syndrome, myasthenia gravis seen today for  for Electrophysiology evaluation of atrial fibrillation at the request of Kardie Tobb.    She wore a cardiac monitor that showed intermittent episodes of atrial fibrillation.  She is currently on Xarelto .  She feels that her heart rate increases with stress.  She believes that this is a trigger for her atrial fibrillation.  She does have a family history of atrial fibrillation with a sister undergoing multiple ablations.  Today, denies symptoms of palpitations, chest pain, dyspnea, orthopnea, PND, lower extremity edema, claudication, dizziness, presyncope, syncope, bleeding, or neurologic sequela. The patient is tolerating medications without difficulties. Planablation today.   EP Information / Studies Reviewed:    EKG is ordered today. Personal review as below.        Risk Assessment/Calculations:    CHA2DS2-VASc Score = 3   This indicates a 3.2% annual risk of stroke. The patient's score is based upon: CHF History: 0 HTN History: 1 Diabetes History: 0 Stroke History: 0 Vascular Disease History: 1 Age Score: 0 Gender Score: 1            Physical Exam:   VS:  BP 117/66   Pulse 60   Temp 97.8 F (36.6 C) (Oral)   Resp 17   Ht 5' 4 (1.626 m)   Wt 99.3 kg   SpO2 99%   BMI 37.59 kg/m    Wt Readings from Last 3 Encounters:  09/10/23 99.3 kg  08/08/23 98.9 kg  07/09/23 99.7 kg    GEN: No acute distress.   Neck: No JVD Cardiac: RRR, no murmurs, rubs, or gallops.  Respiratory: normal BS bilaterally. GI:  Soft, nontender, non-distended  MS: No edema; No deformity. Neuro:  Nonfocal  Skin: warm and dry Psych: Normal affect    ASSESSMENT AND PLAN:    1.  Paroxysmal atrial fibrillation: Theresa Lucas has presented today for surgery, with the diagnosis of AF.  The various methods of treatment have been discussed with the patient and family. After consideration of risks, benefits and other options for treatment, the patient has consented to  Procedure(s): Catheter ablation as a surgical intervention .  Risks include but not limited to complete heart block, stroke, esophageal damage, nerve damage, bleeding, vascular damage, tamponade, perforation, MI, and death. The patient's history has been reviewed, patient examined, no change in status, stable for surgery.  I have reviewed the patient's chart and labs.  Questions were answered to the patient's satisfaction.    Theresa Abraha Norton, MD 09/10/2023 7:01 AM

## 2023-09-10 NOTE — Anesthesia Procedure Notes (Signed)
 Procedure Name: Intubation Date/Time: 09/10/2023 7:46 AM  Performed by: Virgil Ee, CRNAPre-anesthesia Checklist: Patient identified, Patient being monitored, Timeout performed, Emergency Drugs available and Suction available Patient Re-evaluated:Patient Re-evaluated prior to induction Oxygen  Delivery Method: Circle system utilized Preoxygenation: Pre-oxygenation with 100% oxygen  Induction Type: IV induction Ventilation: Mask ventilation without difficulty Laryngoscope Size: Mac and 4 Grade View: Grade I Tube type: Oral Tube size: 7.0 mm Number of attempts: 1 Airway Equipment and Method: Stylet Placement Confirmation: ETT inserted through vocal cords under direct vision, positive ETCO2 and breath sounds checked- equal and bilateral Secured at: 22 cm Tube secured with: Tape Dental Injury: Teeth and Oropharynx as per pre-operative assessment

## 2023-09-11 ENCOUNTER — Telehealth (HOSPITAL_COMMUNITY): Payer: Self-pay

## 2023-09-11 MED FILL — Fentanyl Citrate Preservative Free (PF) Inj 100 MCG/2ML: INTRAMUSCULAR | Qty: 2 | Status: AC

## 2023-09-11 NOTE — Anesthesia Postprocedure Evaluation (Signed)
 Anesthesia Post Note  Patient: Theresa Lucas  Procedure(s) Performed: ATRIAL FIBRILLATION ABLATION     Patient location during evaluation: PACU Anesthesia Type: General Level of consciousness: awake and alert Pain management: pain level controlled Vital Signs Assessment: post-procedure vital signs reviewed and stable Respiratory status: spontaneous breathing, nonlabored ventilation, respiratory function stable and patient connected to nasal cannula oxygen  Cardiovascular status: blood pressure returned to baseline and stable Postop Assessment: no apparent nausea or vomiting Anesthetic complications: no   There were no known notable events for this encounter.  Last Vitals:  Vitals:   09/10/23 1200 09/10/23 1251  BP: 102/67 102/65  Pulse: 75 67  Resp: (!) 26 (!) 24  Temp:    SpO2: 95% 95%    Last Pain:  Vitals:   09/10/23 1251  TempSrc:   PainSc: 0-No pain                 Epifanio Lamar BRAVO

## 2023-09-11 NOTE — Telephone Encounter (Signed)
 Spoke with patient to complete post procedure follow up call.  Patient reports no complications with groin sites.   Instructions reviewed with patient:  Remove large bandage at puncture site after 24 hours. It is normal to have bruising, tenderness, mild swelling, and a pea or marble sized lump/knot at the groin site which can take up to three months to resolve.  Get help right away if you notice sudden swelling at the puncture site.  Check your puncture site every day for signs of infection: fever, redness, swelling, pus drainage, warmth, foul odor or excessive pain. If this occurs, please call the office at [phone], to speak with the nurse. Get help right away if your puncture site is bleeding and the bleeding does not stop after applying firm pressure to the area.  You may continue to have skipped beats/ atrial fibrillation during the first several months after your procedure.  It is very important not to miss any doses of your blood thinner Xarelto . Patient restarted taking this medication on 09/10/23.   You will follow up with the Afib clinic on 10/08/23 and follow up with the Afib clinic on 12/11/23.   Patient verbalized understanding to all instructions provided.

## 2023-09-12 ENCOUNTER — Encounter: Payer: Self-pay | Admitting: Emergency Medicine

## 2023-09-24 ENCOUNTER — Encounter (HOSPITAL_BASED_OUTPATIENT_CLINIC_OR_DEPARTMENT_OTHER): Payer: Self-pay | Admitting: *Deleted

## 2023-09-25 ENCOUNTER — Ambulatory Visit: Attending: Cardiology | Admitting: Cardiology

## 2023-09-25 ENCOUNTER — Encounter: Payer: Self-pay | Admitting: Cardiology

## 2023-09-25 VITALS — BP 112/72 | HR 62 | Ht 64.0 in

## 2023-09-25 DIAGNOSIS — I10 Essential (primary) hypertension: Secondary | ICD-10-CM

## 2023-09-25 DIAGNOSIS — I480 Paroxysmal atrial fibrillation: Secondary | ICD-10-CM | POA: Diagnosis not present

## 2023-09-25 DIAGNOSIS — E782 Mixed hyperlipidemia: Secondary | ICD-10-CM | POA: Diagnosis not present

## 2023-09-25 NOTE — Patient Instructions (Signed)
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
## 2023-10-08 ENCOUNTER — Ambulatory Visit (HOSPITAL_COMMUNITY)
Admission: RE | Admit: 2023-10-08 | Discharge: 2023-10-08 | Disposition: A | Discharge status: Home / Self Care | Source: Ambulatory Visit | Attending: Physician Assistant | Admitting: Physician Assistant

## 2023-10-08 ENCOUNTER — Encounter (HOSPITAL_COMMUNITY): Payer: Self-pay | Admitting: Physician Assistant

## 2023-10-08 VITALS — BP 118/74 | HR 65 | Ht 64.0 in | Wt 217.6 lb

## 2023-10-08 DIAGNOSIS — I480 Paroxysmal atrial fibrillation: Secondary | ICD-10-CM

## 2023-10-08 DIAGNOSIS — Z79899 Other long term (current) drug therapy: Secondary | ICD-10-CM | POA: Diagnosis not present

## 2023-10-08 DIAGNOSIS — Z5181 Encounter for therapeutic drug level monitoring: Secondary | ICD-10-CM | POA: Diagnosis not present

## 2023-10-08 DIAGNOSIS — D6869 Other thrombophilia: Secondary | ICD-10-CM | POA: Diagnosis not present
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
## 2023-10-10 DIAGNOSIS — M797 Fibromyalgia: Secondary | ICD-10-CM | POA: Diagnosis not present

## 2023-10-10 DIAGNOSIS — M7552 Bursitis of left shoulder: Secondary | ICD-10-CM | POA: Diagnosis not present

## 2023-10-10 DIAGNOSIS — M25512 Pain in left shoulder: Secondary | ICD-10-CM | POA: Diagnosis not present

## 2023-10-22 DIAGNOSIS — M47816 Spondylosis without myelopathy or radiculopathy, lumbar region: Secondary | ICD-10-CM | POA: Diagnosis not present

## 2023-10-22 DIAGNOSIS — G894 Chronic pain syndrome: Secondary | ICD-10-CM | POA: Diagnosis not present

## 2023-10-22 DIAGNOSIS — M48061 Spinal stenosis, lumbar region without neurogenic claudication: Secondary | ICD-10-CM | POA: Diagnosis not present

## 2023-10-22 DIAGNOSIS — Z79891 Long term (current) use of opiate analgesic: Secondary | ICD-10-CM | POA: Diagnosis not present

## 2023-10-22 DIAGNOSIS — Z5181 Encounter for therapeutic drug level monitoring: Secondary | ICD-10-CM | POA: Diagnosis not present

## 2023-10-22 DIAGNOSIS — M47812 Spondylosis without myelopathy or radiculopathy, cervical region: Secondary | ICD-10-CM | POA: Diagnosis not present

## 2023-11-19 DIAGNOSIS — N3000 Acute cystitis without hematuria: Secondary | ICD-10-CM | POA: Diagnosis not present

## 2023-11-19 DIAGNOSIS — J4540 Moderate persistent asthma, uncomplicated: Secondary | ICD-10-CM | POA: Diagnosis not present

## 2023-11-19 DIAGNOSIS — R300 Dysuria: Secondary | ICD-10-CM | POA: Diagnosis not present

## 2023-11-19 DIAGNOSIS — Z6836 Body mass index (BMI) 36.0-36.9, adult: Secondary | ICD-10-CM | POA: Diagnosis not present

## 2023-11-19 DIAGNOSIS — J019 Acute sinusitis, unspecified: Secondary | ICD-10-CM | POA: Diagnosis not present

## 2023-11-19 DIAGNOSIS — J300 Vasomotor rhinitis: Secondary | ICD-10-CM | POA: Diagnosis not present

## 2023-11-19 DIAGNOSIS — K219 Gastro-esophageal reflux disease without esophagitis: Secondary | ICD-10-CM | POA: Diagnosis not present

## 2023-11-29 DIAGNOSIS — Z131 Encounter for screening for diabetes mellitus: Secondary | ICD-10-CM | POA: Diagnosis not present

## 2023-11-29 DIAGNOSIS — Z0001 Encounter for general adult medical examination with abnormal findings: Secondary | ICD-10-CM | POA: Diagnosis not present

## 2023-11-29 DIAGNOSIS — Z130 Encounter for screening for diseases of the blood and blood-forming organs and certain disorders involving the immune mechanism: Secondary | ICD-10-CM | POA: Diagnosis not present

## 2023-11-29 DIAGNOSIS — E559 Vitamin D deficiency, unspecified: Secondary | ICD-10-CM | POA: Diagnosis not present

## 2023-11-29 DIAGNOSIS — R946 Abnormal results of thyroid function studies: Secondary | ICD-10-CM | POA: Diagnosis not present

## 2023-11-29 DIAGNOSIS — Z1159 Encounter for screening for other viral diseases: Secondary | ICD-10-CM | POA: Diagnosis not present

## 2023-11-29 DIAGNOSIS — E7849 Other hyperlipidemia: Secondary | ICD-10-CM | POA: Diagnosis not present

## 2023-12-03 DIAGNOSIS — E782 Mixed hyperlipidemia: Secondary | ICD-10-CM | POA: Diagnosis not present

## 2023-12-03 DIAGNOSIS — R4582 Worries: Secondary | ICD-10-CM | POA: Diagnosis not present

## 2023-12-03 DIAGNOSIS — I10 Essential (primary) hypertension: Secondary | ICD-10-CM | POA: Diagnosis not present

## 2023-12-03 DIAGNOSIS — Z0001 Encounter for general adult medical examination with abnormal findings: Secondary | ICD-10-CM | POA: Diagnosis not present

## 2023-12-03 DIAGNOSIS — J4540 Moderate persistent asthma, uncomplicated: Secondary | ICD-10-CM | POA: Diagnosis not present

## 2023-12-03 DIAGNOSIS — Z6836 Body mass index (BMI) 36.0-36.9, adult: Secondary | ICD-10-CM | POA: Diagnosis not present

## 2023-12-03 DIAGNOSIS — E7849 Other hyperlipidemia: Secondary | ICD-10-CM | POA: Diagnosis not present

## 2023-12-03 DIAGNOSIS — Z1331 Encounter for screening for depression: Secondary | ICD-10-CM | POA: Diagnosis not present

## 2023-12-07 ENCOUNTER — Other Ambulatory Visit: Payer: Self-pay | Admitting: Cardiology

## 2023-12-11 ENCOUNTER — Ambulatory Visit (HOSPITAL_COMMUNITY): Admitting: Physician Assistant

## 2023-12-13 ENCOUNTER — Ambulatory Visit (HOSPITAL_COMMUNITY)
Admission: RE | Admit: 2023-12-13 | Discharge: 2023-12-13 | Disposition: A | Discharge status: Home / Self Care | Source: Ambulatory Visit | Attending: Physician Assistant | Admitting: Physician Assistant

## 2023-12-13 VITALS — BP 128/62 | HR 72 | Ht 64.0 in | Wt 213.2 lb

## 2023-12-13 DIAGNOSIS — Z6836 Body mass index (BMI) 36.0-36.9, adult: Secondary | ICD-10-CM | POA: Diagnosis not present

## 2023-12-13 DIAGNOSIS — I4891 Unspecified atrial fibrillation: Secondary | ICD-10-CM

## 2023-12-13 DIAGNOSIS — Z0001 Encounter for general adult medical examination with abnormal findings: Secondary | ICD-10-CM | POA: Diagnosis not present

## 2023-12-13 DIAGNOSIS — D6869 Other thrombophilia: Secondary | ICD-10-CM

## 2023-12-13 DIAGNOSIS — I480 Paroxysmal atrial fibrillation: Secondary | ICD-10-CM

## 2023-12-13 DIAGNOSIS — Z1389 Encounter for screening for other disorder: Secondary | ICD-10-CM | POA: Diagnosis not present

## 2023-12-13 DIAGNOSIS — Z1331 Encounter for screening for depression: Secondary | ICD-10-CM | POA: Diagnosis not present
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
## 2023-12-13 NOTE — Patient Instructions (Signed)
 Stop Flecainide   Follow up 6 months with Dr Inocencio

## 2024-01-13 ENCOUNTER — Other Ambulatory Visit: Payer: Self-pay | Admitting: Cardiology

## 2024-01-22 ENCOUNTER — Telehealth: Payer: Self-pay | Admitting: Cardiology

## 2024-01-22 MED ORDER — DILTIAZEM HCL ER COATED BEADS 240 MG PO CP24
240.0000 mg | ORAL_CAPSULE | Freq: Every day | ORAL | 1 refills | Status: AC
Start: 2024-01-22 — End: ?

## 2024-01-22 NOTE — Telephone Encounter (Signed)
 Refill has been sent.

## 2024-01-22 NOTE — Telephone Encounter (Signed)
*  STAT* If patient is at the pharmacy, call can be transferred to refill team.   1. Which medications need to be refilled? (please list name of each medication and dose if known) diltiazem  (CARDIZEM  CD) 240 MG 24 hr capsule    2. Would you like to learn more about the convenience, safety, & potential cost savings by using the [facility]?No      3. Are you open to using the [facility] (Type [facility]. No    4. Which pharmacy/location (including street and city if local pharmacy) is medication to be sent to? [id number] - [place], [place] - [address]     5. Do they need a 30 day or 90 day supply? 90 day

## 2024-01-23 DIAGNOSIS — Z5181 Encounter for therapeutic drug level monitoring: Secondary | ICD-10-CM | POA: Diagnosis not present

## 2024-01-23 DIAGNOSIS — M48061 Spinal stenosis, lumbar region without neurogenic claudication: Secondary | ICD-10-CM | POA: Diagnosis not present

## 2024-01-23 DIAGNOSIS — Z79891 Long term (current) use of opiate analgesic: Secondary | ICD-10-CM | POA: Diagnosis not present

## 2024-01-23 DIAGNOSIS — G894 Chronic pain syndrome: Secondary | ICD-10-CM | POA: Diagnosis not present

## 2024-01-23 DIAGNOSIS — M47816 Spondylosis without myelopathy or radiculopathy, lumbar region: Secondary | ICD-10-CM | POA: Diagnosis not present

## 2024-01-26 DIAGNOSIS — I4891 Unspecified atrial fibrillation: Secondary | ICD-10-CM | POA: Diagnosis not present

## 2024-01-26 DIAGNOSIS — M461 Sacroiliitis, not elsewhere classified: Secondary | ICD-10-CM | POA: Diagnosis not present

## 2024-01-26 DIAGNOSIS — Z79899 Other long term (current) drug therapy: Secondary | ICD-10-CM | POA: Diagnosis not present

## 2024-01-26 DIAGNOSIS — I10 Essential (primary) hypertension: Secondary | ICD-10-CM | POA: Diagnosis not present

## 2024-01-26 DIAGNOSIS — Z888 Allergy status to other drugs, medicaments and biological substances status: Secondary | ICD-10-CM | POA: Diagnosis not present

## 2024-01-26 DIAGNOSIS — Z7951 Long term (current) use of inhaled steroids: Secondary | ICD-10-CM | POA: Diagnosis not present

## 2024-01-26 DIAGNOSIS — E785 Hyperlipidemia, unspecified: Secondary | ICD-10-CM | POA: Diagnosis not present

## 2024-01-26 DIAGNOSIS — G7000 Myasthenia gravis without (acute) exacerbation: Secondary | ICD-10-CM | POA: Diagnosis not present

## 2024-01-26 DIAGNOSIS — D6869 Other thrombophilia: Secondary | ICD-10-CM | POA: Diagnosis not present

## 2024-01-26 DIAGNOSIS — Z6835 Body mass index (BMI) 35.0-35.9, adult: Secondary | ICD-10-CM | POA: Diagnosis not present

## 2024-01-26 DIAGNOSIS — F419 Anxiety disorder, unspecified: Secondary | ICD-10-CM | POA: Diagnosis not present

## 2024-01-26 DIAGNOSIS — Z7901 Long term (current) use of anticoagulants: Secondary | ICD-10-CM | POA: Diagnosis not present

## 2024-01-26 DIAGNOSIS — I871 Compression of vein: Secondary | ICD-10-CM | POA: Diagnosis not present

## 2024-01-26 DIAGNOSIS — K219 Gastro-esophageal reflux disease without esophagitis: Secondary | ICD-10-CM | POA: Diagnosis not present

## 2024-01-26 DIAGNOSIS — N3946 Mixed incontinence: Secondary | ICD-10-CM | POA: Diagnosis not present

## 2024-01-26 DIAGNOSIS — I700 Atherosclerosis of aorta: Secondary | ICD-10-CM | POA: Diagnosis not present

## 2024-01-26 DIAGNOSIS — J45909 Unspecified asthma, uncomplicated: Secondary | ICD-10-CM | POA: Diagnosis not present

## 2024-01-26 DIAGNOSIS — I252 Old myocardial infarction: Secondary | ICD-10-CM | POA: Diagnosis not present

## 2024-02-15 ENCOUNTER — Ambulatory Visit: Admitting: Neurology
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
## 2024-03-28 ENCOUNTER — Encounter: Payer: Self-pay | Admitting: Neurology

## 2024-03-28 ENCOUNTER — Ambulatory Visit: Admitting: Neurology

## 2024-03-28 VITALS — BP 120/78 | HR 68 | Ht 64.0 in | Wt 206.0 lb

## 2024-03-28 DIAGNOSIS — G7000 Myasthenia gravis without (acute) exacerbation: Secondary | ICD-10-CM

## 2024-03-28 MED ORDER — PREDNISONE 1 MG PO TABS: 4.0000 mg | ORAL_TABLET | Freq: Every day | ORAL | 5 refills | Status: AC

## 2024-03-28 NOTE — Patient Instructions (Addendum)
-  Reduce prednisone  to 4 mg daily -Continue mestinon  60 mg up to three times daily as needed  -Continue pepcid  and protonix  -Continue Vit D 1000 international units daily -Continue Calcium  600 mg daily  I will see you again in about 6 months. Please let me know if you have any questions or concerns in the meantime.  Go to nearest emergency room if you have severe weakness, difficulty breathing or swallowing as this could be a myasthenic crisis (flare).  The physicians and staff at [facility] are committed to providing excellent care. You may receive a survey requesting feedback about your experience at our office. We strive to receive very good responses to the survey questions. If you feel that your experience would prevent you from giving the office a very good  response, please contact our office to try to remedy the situation. We may be reached at [phone]. Thank you for taking the time out of your busy day to complete the survey.  Venetia Potters, MD [facility]

## 2024-05-28 ENCOUNTER — Ambulatory Visit: Admitting: Cardiology

## 2024-06-05 ENCOUNTER — Ambulatory Visit: Admitting: Cardiology

## 2024-06-06 ENCOUNTER — Ambulatory Visit: Admitting: Neurology

## 2024-10-17 ENCOUNTER — Ambulatory Visit: Payer: Self-pay | Admitting: Neurology
